# Patient Record
Sex: Male | Born: 1980 | Race: White | Hispanic: No | Marital: Single | Smoking: Current some day smoker
Health system: Southern US, Community
[De-identification: ages and names within clinical notes are randomized; demographics above are authoritative.]

## PROBLEM LIST (undated history)

## (undated) DIAGNOSIS — G5600 Carpal tunnel syndrome, unspecified upper limb: Secondary | ICD-10-CM

## (undated) DIAGNOSIS — Z720 Tobacco use: Secondary | ICD-10-CM

## (undated) DIAGNOSIS — B1920 Unspecified viral hepatitis C without hepatic coma: Secondary | ICD-10-CM

## (undated) DIAGNOSIS — I21A1 Myocardial infarction type 2: Secondary | ICD-10-CM

## (undated) DIAGNOSIS — R7989 Other specified abnormal findings of blood chemistry: Secondary | ICD-10-CM

## (undated) DIAGNOSIS — K4090 Unilateral inguinal hernia, without obstruction or gangrene, not specified as recurrent: Secondary | ICD-10-CM

## (undated) DIAGNOSIS — F1920 Other psychoactive substance dependence, uncomplicated: Secondary | ICD-10-CM

## (undated) DIAGNOSIS — T50901A Poisoning by unspecified drugs, medicaments and biological substances, accidental (unintentional), initial encounter: Secondary | ICD-10-CM

## (undated) DIAGNOSIS — J9601 Acute respiratory failure with hypoxia: Secondary | ICD-10-CM

## (undated) DIAGNOSIS — F410 Panic disorder [episodic paroxysmal anxiety] without agoraphobia: Secondary | ICD-10-CM

## (undated) DIAGNOSIS — F419 Anxiety disorder, unspecified: Secondary | ICD-10-CM

## (undated) DIAGNOSIS — A630 Anogenital (venereal) warts: Secondary | ICD-10-CM

## (undated) HISTORY — DX: Acute respiratory failure with hypoxia: J96.01

## (undated) HISTORY — DX: Other psychoactive substance dependence, uncomplicated: F19.20

## (undated) HISTORY — DX: Panic disorder (episodic paroxysmal anxiety): F41.0

## (undated) HISTORY — DX: Anogenital (venereal) warts: A63.0

## (undated) HISTORY — DX: Other specified abnormal findings of blood chemistry: R79.89

## (undated) HISTORY — DX: Anxiety disorder, unspecified: F41.9

## (undated) HISTORY — PX: MANDIBLE FRACTURE SURGERY: SHX706

## (undated) HISTORY — DX: Carpal tunnel syndrome, unspecified upper limb: G56.00

## (undated) HISTORY — DX: Unspecified viral hepatitis C without hepatic coma: B19.20

## (undated) HISTORY — DX: Tobacco use: Z72.0

## (undated) HISTORY — DX: Myocardial infarction type 2: I21.A1

## (undated) HISTORY — DX: Unilateral inguinal hernia, without obstruction or gangrene, not specified as recurrent: K40.90

---
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
## 2001-02-18 ENCOUNTER — Encounter: Admission: RE | Admit: 2001-02-18 | Discharge: 2001-02-18 | Payer: Self-pay | Admitting: Sports Medicine

## 2001-05-08 ENCOUNTER — Encounter: Admission: RE | Admit: 2001-05-08 | Discharge: 2001-05-08 | Payer: Self-pay | Admitting: Family Medicine

## 2001-05-08 ENCOUNTER — Encounter: Payer: Self-pay | Admitting: Family Medicine

## 2001-05-20 ENCOUNTER — Encounter: Admission: RE | Admit: 2001-05-20 | Discharge: 2001-05-20 | Payer: Self-pay | Admitting: Family Medicine

## 2004-05-07 ENCOUNTER — Emergency Department (HOSPITAL_COMMUNITY): Admission: EM | Admit: 2004-05-07 | Discharge: 2004-05-07 | Payer: Self-pay | Admitting: Family Medicine

## 2005-05-11 ENCOUNTER — Ambulatory Visit: Payer: Self-pay | Admitting: Family Medicine

## 2005-08-29 ENCOUNTER — Ambulatory Visit: Payer: Self-pay | Admitting: Sports Medicine

## 2005-09-18 ENCOUNTER — Ambulatory Visit: Payer: Self-pay | Admitting: Family Medicine

## 2005-10-06 ENCOUNTER — Ambulatory Visit: Payer: Self-pay | Admitting: Family Medicine

## 2005-10-19 ENCOUNTER — Ambulatory Visit: Payer: Self-pay | Admitting: Family Medicine

## 2005-11-08 ENCOUNTER — Ambulatory Visit: Payer: Self-pay | Admitting: Sports Medicine

## 2005-11-17 ENCOUNTER — Ambulatory Visit: Payer: Self-pay | Admitting: Family Medicine

## 2006-01-05 ENCOUNTER — Ambulatory Visit: Payer: Self-pay | Admitting: Sports Medicine

## 2006-01-10 ENCOUNTER — Ambulatory Visit: Payer: Self-pay | Admitting: Family Medicine

## 2006-02-27 ENCOUNTER — Ambulatory Visit: Payer: Self-pay | Admitting: Sports Medicine

## 2006-04-24 ENCOUNTER — Ambulatory Visit: Payer: Self-pay | Admitting: Sports Medicine

## 2006-06-27 ENCOUNTER — Ambulatory Visit: Payer: Self-pay | Admitting: Family Medicine

## 2006-10-11 ENCOUNTER — Ambulatory Visit: Payer: Self-pay | Admitting: Sports Medicine

## 2006-10-16 ENCOUNTER — Ambulatory Visit: Payer: Self-pay | Admitting: Family Medicine

## 2007-01-16 ENCOUNTER — Ambulatory Visit: Payer: Self-pay | Admitting: Family Medicine

## 2007-02-07 DIAGNOSIS — F411 Generalized anxiety disorder: Secondary | ICD-10-CM | POA: Insufficient documentation

## 2007-02-07 DIAGNOSIS — F410 Panic disorder [episodic paroxysmal anxiety] without agoraphobia: Secondary | ICD-10-CM | POA: Insufficient documentation

## 2007-03-28 ENCOUNTER — Encounter: Payer: Self-pay | Admitting: *Deleted

## 2007-07-17 ENCOUNTER — Encounter: Payer: Self-pay | Admitting: *Deleted

## 2007-07-24 ENCOUNTER — Encounter: Payer: Self-pay | Admitting: *Deleted

## 2007-07-25 ENCOUNTER — Ambulatory Visit: Payer: Self-pay | Admitting: Family Medicine

## 2007-08-29 ENCOUNTER — Ambulatory Visit: Payer: Self-pay | Admitting: Family Medicine

## 2007-09-03 ENCOUNTER — Telehealth: Payer: Self-pay | Admitting: Family Medicine

## 2007-11-04 ENCOUNTER — Encounter: Payer: Self-pay | Admitting: *Deleted

## 2007-12-10 ENCOUNTER — Telehealth: Payer: Self-pay | Admitting: *Deleted

## 2007-12-27 ENCOUNTER — Ambulatory Visit: Payer: Self-pay | Admitting: Family Medicine

## 2007-12-27 DIAGNOSIS — A630 Anogenital (venereal) warts: Secondary | ICD-10-CM

## 2008-01-27 ENCOUNTER — Telehealth: Payer: Self-pay | Admitting: Family Medicine

## 2008-01-30 ENCOUNTER — Encounter: Payer: Self-pay | Admitting: Family Medicine

## 2008-01-30 ENCOUNTER — Encounter (INDEPENDENT_AMBULATORY_CARE_PROVIDER_SITE_OTHER): Payer: Self-pay | Admitting: *Deleted

## 2008-02-03 ENCOUNTER — Telehealth: Payer: Self-pay | Admitting: *Deleted

## 2008-02-04 ENCOUNTER — Encounter: Payer: Self-pay | Admitting: *Deleted

## 2008-03-12 ENCOUNTER — Ambulatory Visit (HOSPITAL_COMMUNITY): Admission: RE | Admit: 2008-03-12 | Discharge: 2008-03-12 | Payer: Self-pay | Admitting: Family Medicine

## 2008-03-12 ENCOUNTER — Ambulatory Visit: Payer: Self-pay | Admitting: Family Medicine

## 2008-03-12 DIAGNOSIS — F17200 Nicotine dependence, unspecified, uncomplicated: Secondary | ICD-10-CM | POA: Insufficient documentation

## 2008-03-19 ENCOUNTER — Encounter: Payer: Self-pay | Admitting: Family Medicine

## 2008-07-13 ENCOUNTER — Telehealth: Payer: Self-pay | Admitting: Family Medicine

## 2008-07-16 ENCOUNTER — Telehealth: Payer: Self-pay | Admitting: Family Medicine

## 2008-09-06 ENCOUNTER — Encounter: Payer: Self-pay | Admitting: Emergency Medicine

## 2008-09-06 ENCOUNTER — Ambulatory Visit (HOSPITAL_COMMUNITY): Admission: EM | Admit: 2008-09-06 | Discharge: 2008-09-06 | Payer: Self-pay | Admitting: Emergency Medicine

## 2008-09-22 ENCOUNTER — Ambulatory Visit (HOSPITAL_COMMUNITY): Admission: RE | Admit: 2008-09-22 | Discharge: 2008-09-22 | Payer: Self-pay | Admitting: Otolaryngology

## 2008-10-01 ENCOUNTER — Ambulatory Visit (HOSPITAL_BASED_OUTPATIENT_CLINIC_OR_DEPARTMENT_OTHER): Admission: RE | Admit: 2008-10-01 | Discharge: 2008-10-01 | Payer: Self-pay | Admitting: Otolaryngology

## 2008-10-28 ENCOUNTER — Telehealth: Payer: Self-pay | Admitting: Family Medicine

## 2008-11-10 ENCOUNTER — Ambulatory Visit: Payer: Self-pay | Admitting: Family Medicine

## 2009-08-18 ENCOUNTER — Ambulatory Visit: Payer: Self-pay | Admitting: Family Medicine

## 2009-08-23 ENCOUNTER — Encounter: Payer: Self-pay | Admitting: Family Medicine

## 2010-01-03 ENCOUNTER — Telehealth (INDEPENDENT_AMBULATORY_CARE_PROVIDER_SITE_OTHER): Payer: Self-pay | Admitting: *Deleted

## 2010-01-13 ENCOUNTER — Telehealth: Payer: Self-pay | Admitting: Family Medicine

## 2010-01-13 ENCOUNTER — Telehealth (INDEPENDENT_AMBULATORY_CARE_PROVIDER_SITE_OTHER): Payer: Self-pay | Admitting: *Deleted

## 2010-02-10 ENCOUNTER — Encounter: Payer: Self-pay | Admitting: *Deleted

## 2010-03-03 ENCOUNTER — Ambulatory Visit: Payer: Self-pay | Admitting: Family Medicine

## 2010-06-02 ENCOUNTER — Encounter: Payer: Self-pay | Admitting: Family Medicine

## 2010-06-28 ENCOUNTER — Emergency Department (HOSPITAL_COMMUNITY): Admission: EM | Admit: 2010-06-28 | Discharge: 2010-06-29 | Payer: Self-pay | Admitting: Emergency Medicine

## 2011-01-10 NOTE — Miscellaneous (Signed)
Summary: patient summary  Clinical Lists Changes  Nice, but slightly challenging pt.  Main problem is anxiety.  has been on klonopin for years.  functions fairly well on klonopin.  has controlled substances contract.  I have asked him to come in every 4 months for his anxiety.  care also complicated by no insurance.  has had issues trying to get debra hill.    Problems: Removed problem of TINEA VERSICOLOR (ICD-111.0) Removed problem of BRADYCARDIA (ICD-427.89) Removed problem of FOLLICULITIS (ICD-704.8) Removed problem of DERMATITIS, CONTACT, NOS (ICD-692.9) Removed problem of Symptom of  RECTAL BLEEDING, HX OF (ICD-V12.79) Removed problem of Symptom of  SOB (ICD-786.05) Removed problem of DEPRESSIVE DISORDER, NOS (ICD-311) Assessed ANXIETY as comment only -  His updated medication list for this problem includes:    Klonopin 0.5 Mg Tabs (Clonazepam) .Marland Kitchen... Take 2 tablets po bid  Assessed PANIC ATTACKS as comment only -  His updated medication list for this problem includes:    Klonopin 0.5 Mg Tabs (Clonazepam) .Marland Kitchen... Take 2 tablets po bid  Observations: Added new observation of SOCIAL HX: works Furniture conservator/restorer.  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  single.  smoker.  former drug user, but not current.  no insurance.  lots of financial and social stressors right now. (06/02/2010 13:27) Added new observation of FAMILY HX: mom - asthma, bronchitis, cholesterol,  father-diabetes, hypertension, hyperlipidemia  FGF- deceased unknown FGM dies of lung CA at 33,  MGF and MGM well, Younger sister well. (06/02/2010 13:27) Added new observation of PSH REVIEWED: reviewed - no changes required (06/02/2010 13:27) Added new observation of PAST MED HX: Folliculitis, striae panic attacks/ anxiety rectal bleeding  BRBRP (06/02/2010 13:27)      Impression & Recommendations:  Problem # 1:  ANXIETY (ICD-300.00) Assessment Comment Only functions fairly well on klonopin.  I still think it would be a good idea for  him to go to psychiatry or mood disorders clinic for more extensive evaluation of his mental health issues.  however, this has been challenging because he has no insurance.   His updated medication list for this problem includes:    Klonopin 0.5 Mg Tabs (Clonazepam) .Marland Kitchen... Take 2 tablets po bid  Problem # 2:  PANIC ATTACKS (ICD-300.01) Assessment: Comment Only see above discussion His updated medication list for this problem includes:    Klonopin 0.5 Mg Tabs (Clonazepam) .Marland Kitchen... Take 2 tablets po bid  Complete Medication List: 1)  Prilosec 20 Mg Cpdr (Omeprazole) .... Take 1 capsule by mouth once a day 2)  Klonopin 0.5 Mg Tabs (Clonazepam) .... Take 2 tablets po bid   Past History:  Past Medical History: Folliculitis, striae panic attacks/ anxiety rectal bleeding  BRBRP  Past Surgical History: Reviewed history from 07/25/2007 and no changes required. none   Family History: mom - asthma, bronchitis, cholesterol,  father-diabetes, hypertension, hyperlipidemia  FGF- deceased unknown FGM dies of lung CA at 67,  MGF and MGM well, Younger sister well.  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  single.  smoker.  former drug user, but not current.  no insurance.  lots of financial and social stressors right now.

## 2011-01-10 NOTE — Assessment & Plan Note (Signed)
Summary: f/up,tcb   Vital Signs:  Patient profile:   30 year old male Weight:      221.8 pounds Temp:     98.3 degrees F oral Pulse rate:   69 / minute Pulse rhythm:   regular BP sitting:   120 / 68  (left arm) Cuff size:   large  Vitals Entered By: Loralee Pacas CMA  (March 03, 2010 3:41 PM) CC: anxiety, smoking Comments pt needs a refill on klonipin and would like to talk about an rx to help him stop smoking(chantix)   Primary Care Provider:  Asher Muir MD  CC:  anxiety and smoking.  History of Present Illness: 1.  anxiety/depression--thinks he may be more depressed.  lots of stress, esp financial and at work.  losing train of thought at times.  work has not been going well.  smoking more.  sleep is not good.  +irritability and guilt.  has gained some weight.  no hopelessness or anhedonia.  no suicidal thoughts.  no symptoms of mania.  on scheduled klonopin, which had been working well for him until lately.    2.  tobacco--would like to quit smoking.  interested in chantix because one of his friends quit using this.  smoking more b/c of stress.    [phone]  Current Medications (verified): 1)  Prilosec 20 Mg Cpdr (Omeprazole) .... Take 1 Capsule By Mouth Once A Day 2)  Klonopin 0.5 Mg  Tabs (Clonazepam) .... Take 2 Tablets Po Bid  Family History: Dad 13- DM, mom 56- asthma, bronchitis, cholesterol, FGF- deceased unknown, FGM dies of lung CA at 3, MGF and MGM well, Younger sister well.  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  single, but has a girlfriend of many years.  smoker.  former drug user, but not current.  no insurance.  lots of financial and social stressors right now.  Review of Systems General:  Denies loss of appetite and weight loss. Psych:  Complains of anxiety, depression, and irritability; denies alternate hallucination ( auditory/visual), easily tearful, sense of great danger, suicidal thoughts/plans, and thoughts of  violence.  Physical Exam  General:  Well-developed,well-nourished,in no acute distress; alert,appropriate and cooperative throughout examination Psych:  Oriented X3 and memory intact for recent and remote.  slightly anxiousnormally interactive and good eye contact.   Additional Exam:  vital signs reviewed    Impression & Recommendations:  Problem # 1:  ANXIETY (ICD-300.00) Assessment Deteriorated  I think this is largely stress-related.  I do not get a clear picture of depression.  also did not get a hx of manic symptoms from him.  Gave him GAD-7, PHQ-9, and mood disorder questionnaire.  he did not have time to fill them out today.  but he is to bring them back to the clinic for me to review; then I will give him a call.  in the meantime, do not want to make any changes.    His updated medication list for this problem includes:    Klonopin 0.5 Mg Tabs (Clonazepam) .Marland Kitchen... Take 2 tablets po bid  Orders: [facility]- Est Level  3 (29528)  Problem # 2:  TOBACCO ABUSE (ICD-305.1) Assessment: Deteriorated  gave smoking cessation handout and 1800-QUITNOW info  Orders: [facility]- Est Level  3 (41324)  Complete Medication List: 1)  Prilosec 20 Mg Cpdr (Omeprazole) .... Take 1 capsule by mouth once a day 2)  Klonopin 0.5 Mg Tabs (Clonazepam) .... Take 2 tablets po bid  Patient Instructions: 1)  It was nice to see you today. 2)  Fill out the questionnaires I gave you and bring them or mail them back when you have a chance. 3)  I think counseling would be helpful for you.  If you are interested, try calling [facility] at [phone].  They are located at [address] in [place].  4)  Please schedule a follow-up appointment in 4 months .  Prescriptions: KLONOPIN 0.5 MG  TABS (CLONAZEPAM) Take 2 tablets PO BID  #120 x 4   Entered and Authorized by:   Asher Muir MD   Signed by:   Asher Muir MD on 03/03/2010   Method used:   Print then Give to Patient   RxID:    [id number]

## 2011-01-10 NOTE — Miscellaneous (Signed)
Summary: no show  Clinical Lists Changes  no show.Loralee Pacas CMA  February 10, 2010 10:03 AM

## 2011-01-10 NOTE — Progress Notes (Signed)
Summary: refill  Phone Note Refill Request Call back at [phone] Message from:  Patient  Refills Requested: Medication #1:  KLONOPIN 0.5 MG  TABS Take 2 tablets PO BID leaving town tomorrow for a week - will be out [employer]  Initial call taken by: De Nurse,  January 13, 2010 9:53 AM  Follow-up for Phone Call        to pcp Follow-up by: Golden Circle RN,  January 13, 2010 9:57 AM  Additional Follow-up for Phone Call Additional follow up Details #1::        pt is leaving at 4:00 tomorrow and is concerned that he will not have it before he leaves. Additional Follow-up by: De Nurse,  January 13, 2010 11:58 AM    Additional Follow-up for Phone Call Additional follow up Details #2::    to pcp Follow-up by: Golden Circle RN,  January 13, 2010 12:08 PM  Additional Follow-up for Phone Call Additional follow up Details #3:: Details for Additional Follow-up Action Taken: REfilled one month supply.  He is due for a follow up visit.  Would you ask him to schedule.  Our agreement is that he will come in for OV every 4 months.  Thanks.  Faxed to [facility] Additional Follow-up by: Asher Muir MD,  January 13, 2010 1:37 PM  Prescriptions: Scarlette Calico 0.5 MG  TABS (CLONAZEPAM) Take 2 tablets PO BID  #120 x 0   Entered and Authorized by:   Asher Muir MD   Signed by:   Asher Muir MD on 01/13/2010   Method used:   Printed then faxed to ...       [facility]  [phone]* (retail)       [address] [place], [place]  [address]       Ph: [phone] or [phone]       Fax: [phone]   RxID:   [id number]  appt scheduled for 2/16.Golden Circle RN  January 13, 2010 2:22 PM

## 2011-01-10 NOTE — Progress Notes (Signed)
Summary: Rx Prob  Phone Note Call from Patient   Caller: Patient Summary of Call: Pharmacy says they do not have rx that Dr. Lafonda Mosses sent in today. Initial call taken by: Clydell Hakim,  January 13, 2010 3:54 PM  Follow-up for Phone Call        rx for Klonopin called in to pharmacy since they did not receive today. patient notified. Follow-up by: Theresia Lo RN,  January 13, 2010 4:58 PM

## 2011-01-10 NOTE — Progress Notes (Signed)
Summary: Blood Type Ques  Phone Note Call from Patient Call back at [phone]   Caller: Patient Summary of Call: Pt checking to see if we have his blood type is on file with [place] and if so what it might be? Initial call taken by: Clydell Hakim,  January 03, 2010 4:32 PM  Follow-up for Phone Call        advised patient that we do not  have a record of his blood type. he  will call hospital medical records to see if they can help him. he was born at [facility]. Follow-up by: Theresia Lo RN,  January 03, 2010 5:26 PM

## 2011-01-20 ENCOUNTER — Encounter: Payer: Self-pay | Admitting: *Deleted
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
## 2011-09-11 LAB — POCT I-STAT 4, (NA,K, GLUC, HGB,HCT)
Glucose, Bld: 103 — ABNORMAL HIGH
HCT: 43
Hemoglobin: 14.6
Potassium: 3.8
Sodium: 144

## 2016-03-27 ENCOUNTER — Ambulatory Visit (INDEPENDENT_AMBULATORY_CARE_PROVIDER_SITE_OTHER): Payer: Self-pay | Admitting: Physician Assistant

## 2016-03-27 VITALS — BP 132/80 | HR 79 | Temp 98.0°F | Resp 17 | Ht 74.0 in | Wt 215.0 lb

## 2016-03-27 DIAGNOSIS — I499 Cardiac arrhythmia, unspecified: Secondary | ICD-10-CM | POA: Insufficient documentation

## 2016-03-27 DIAGNOSIS — F1911 Other psychoactive substance abuse, in remission: Secondary | ICD-10-CM | POA: Insufficient documentation

## 2016-03-27 DIAGNOSIS — K4090 Unilateral inguinal hernia, without obstruction or gangrene, not specified as recurrent: Secondary | ICD-10-CM

## 2016-03-27 DIAGNOSIS — G5603 Carpal tunnel syndrome, bilateral upper limbs: Secondary | ICD-10-CM

## 2016-03-27 DIAGNOSIS — B360 Pityriasis versicolor: Secondary | ICD-10-CM

## 2016-03-27 DIAGNOSIS — Z87898 Personal history of other specified conditions: Secondary | ICD-10-CM

## 2016-03-27 MED ORDER — FLUCONAZOLE 150 MG PO TABS: ORAL_TABLET | ORAL | Status: DC

## 2016-03-27 NOTE — Progress Notes (Signed)
Subjective:    Patient ID: Terrence Henry, male    DOB: 12-26-1980, 35 y.o.   MRN: [id number] Chief Complaint  Patient presents with  . Abdominal Pain    lower left side, concern about hernia x 5 months ago , notice raised area on lower left side   . Palpitations  . Rash  . Numbness    Both hands and fingers    HPI Terrence Henry is a 35 yo Caucasian male with no pmh here today for groin pain with concern for a hernia, palpitations, bilateral hand numbness, and rash.  Patient states that he tore his oblique muscles in high school during cross country running and was told that he needed to have surgery. Patient never followed up for surgery. Approx 5 months ago while he was fixing his car he felt a pop in grown. Since then he has noticed increased bulging and pain in his left groin. He states that the bulging is worse during bowel movements and sex. He has also developed nausea and increased pain in the last week. He has intermittent swelling in his scrotum during strenuous activity. He states he is able to reduce the hernia. He denies any fever, chills, vomiting, constipation, diarrhea, abd pain, or erythema around the hernia site.   Of note patient is uninsured and is self pay. His mom spoke with a surgeon who does payment plans and told him that he needed to be seen at his PCP and then referred. Patient is not sure of the surgeons name. He will talk with his mom this evening and let us know the name of the surgeon so that we can make the referral.  Patient also states that he has a history of an irregular heart rate and murmur that has been mentioned several times in the past by different providers. Patient has not had insurance and has never followed up. He states he feels palpitations occasionally, but denies cp or sob.   Of note patient is a polysubstance drug abuser that quite 5-6 months ago. Approx 10 years ago he was using ectasy and cocaine. He then transitioned to smoking  methamphetamine until 5-6 months ago. He is concerned that his drug use has has caused his heart arrhythmia.   Patient also complains of numbness and tingling in his upper extremities that is worse at night and during his daily activities such as painting,fishing, and DJing. He states that this has been ongoing for approx 1 year. His mother has a history of carpal tunnel. Patient has not tried anything for the pain.   He also complains of a rash on his upper arms and back. He has been treated for tinea versicolor in the past. He denies any pruritis with the rash.      Review of Systems  Constitutional: Negative for fever, chills, diaphoresis and fatigue.  HENT: Negative.   Eyes: Negative.   Respiratory: Negative for cough, chest tightness, shortness of breath and wheezing.   Cardiovascular: Negative for chest pain.  Gastrointestinal: Positive for nausea. Negative for vomiting, abdominal pain, diarrhea, constipation, blood in stool and abdominal distention.  Genitourinary: Positive for scrotal swelling (Intermittant and bulging mass in left groin). Negative for dysuria, urgency, frequency, hematuria, flank pain and testicular pain.  Skin: Positive for rash.  Neurological: Positive for numbness. Negative for dizziness, syncope, weakness, light-headedness and headaches.       Objective:   Physical Exam  Constitutional: He is oriented to person, place, and time. He appears well-developed  and well-nourished. No distress.  Eyes: Pupils are equal, round, and reactive to light.  Neck: Normal range of motion. Neck supple.  Cardiovascular: Normal rate and intact distal pulses.  An irregular rhythm present.  Regular rate and irregular rhythm with an ectopic beat every 4-6 seconds  Pulmonary/Chest: Effort normal and breath sounds normal.  Abdominal: Soft. Bowel sounds are normal. A hernia is present. Hernia confirmed positive in the left inguinal area.  Genitourinary:    Left testis shows no  swelling and no tenderness.  Musculoskeletal:  Positive phalen and tinel test upper extremities bilterally  Neurological: He is alert and oriented to person, place, and time.  Skin: Skin is warm and dry.  Hypopigmented macular papular rash over upper arms and back that is coalescing   Psychiatric: He has a normal mood and affect. His behavior is normal. Judgment and thought content normal.          Assessment & Plan:  1. Unilateral inguinal hernia without obstruction or gangrene, recurrence not specified Able to be reduced with no signs of strangulation or infection. Will refer patient to general surgery when he is able to provide the name of group.  2. Irregular cardiac rhythm Patient is not having any cp or sob with palpitations. Patient does not want an EKG or echo due to cost. This is an ongoing issue. Will eventually need a cardiac workup. Not acute at this time.  3. Tinea versicolor - fluconazole (DIFLUCAN) 150 MG tablet; Take 2 tablets today. Repeat in 1 week.  Dispense: 4 tablet; Refill: 0  4. Bilateral carpal tunnel syndrome Patient refused wrist splints due to cost. Will treat symptomatically.   Azucena Kuba PA-S 03/27/2016

## 2016-03-27 NOTE — Patient Instructions (Addendum)
Please call or email [place] with the surgeon you would like for [place] to refer you to.  Please contact the [facility] Financial Aid Office [phone]. You may qualify for reduced cost at [facility] facilities.    Carpal Tunnel Syndrome Carpal tunnel syndrome is a condition that causes pain in your hand and arm. The carpal tunnel is a narrow area located on the palm side of your wrist. Repeated wrist motion or certain diseases may cause swelling within the tunnel. This swelling pinches the main nerve in the wrist (median nerve). CAUSES  This condition may be caused by:   Repeated wrist motions.  Wrist injuries.  Arthritis.  A cyst or tumor in the carpal tunnel.  Fluid buildup during pregnancy. Sometimes the cause of this condition is not known.  RISK FACTORS This condition is more likely to develop in:   People who have jobs that cause them to repeatedly move their wrists in the same motion, such as butchers and cashiers.  Women.  People with certain conditions, such as:  Diabetes.  Obesity.  An underactive thyroid (hypothyroidism).  Kidney failure. SYMPTOMS  Symptoms of this condition include:   A tingling feeling in your fingers, especially in your thumb, index, and middle fingers.  Tingling or numbness in your hand.  An aching feeling in your entire arm, especially when your wrist and elbow are bent for long periods of time.  Wrist pain that goes up your arm to your shoulder.  Pain that goes down into your palm or fingers.  A weak feeling in your hands. You may have trouble grabbing and holding items. Your symptoms may feel worse during the night.  DIAGNOSIS  This condition is diagnosed with a medical history and physical exam. You may also have tests, including:   An electromyogram (EMG). This test measures electrical signals sent by your nerves into the muscles.  X-rays. TREATMENT  Treatment for this condition includes:  Lifestyle changes. It is  important to stop doing or modify the activity that caused your condition.  Physical or occupational therapy.  Medicines for pain and inflammation. This may include medicine that is injected into your wrist.  A wrist splint.  Surgery. HOME CARE INSTRUCTIONS  If You Have a Splint:  Wear it as told by your health care provider. Remove it only as told by your health care provider.  Loosen the splint if your fingers become numb and tingle, or if they turn cold and blue.  Keep the splint clean and dry. General Instructions  Take over-the-counter and prescription medicines only as told by your health care provider.  Rest your wrist from any activity that may be causing your pain. If your condition is work related, talk to your employer about changes that can be made, such as getting a wrist pad to use while typing.  If directed, apply ice to the painful area:  Put ice in a plastic bag.  Place a towel between your skin and the bag.  Leave the ice on for 20 minutes, 2-3 times per day.  Keep all follow-up visits as told by your health care provider. This is important.  Do any exercises as told by your health care provider, physical therapist, or occupational therapist. SEEK MEDICAL CARE IF:   You have new symptoms.  Your pain is not controlled with medicines.  Your symptoms get worse.   This information is not intended to replace advice given to you by your health care provider. Make sure you discuss  any questions you have with your health care provider.   Document Released: 11/24/2000 Document Revised: 08/18/2015 Document Reviewed: 04/14/2015 Elsevier Interactive Patient Education 2016 [employer]   IF you received an x-ray today, you will receive an invoice from [facility] Please contact [facility] at [phone] with questions or concerns regarding your invoice.   IF you received labwork today, you will receive an invoice from [facility]. Please contact [facility] at [phone] with questions or concerns regarding your invoice.   Our billing staff will not be able to assist you with questions regarding bills from these companies.  You will be contacted with the lab results as soon as they are available. The fastest way to get your results is to activate your My Chart account. Instructions are located on the last page of this paperwork. If you have not heard from us regarding the results in 2 weeks, please contact this office.

## 2016-03-29 NOTE — Progress Notes (Signed)
Subjective:   Patient ID: Terrence Henry, male     DOB: 12/31/1980, 35 y.o.    MRN: [id number]  PCP: No primary care provider on file.  Chief Complaint  Patient presents with  . Abdominal Pain    lower left side, concern about hernia x 5 months ago , notice raised area on lower left side   . Palpitations  . Rash  . Numbness    Both hands and fingers    HPI  Presents for evaluation of groin pain with concern for a hernia, palpitations, bilateral hand numbness, and rash.   Patient states that he tore his oblique muscles in high school during cross country running and was told that he needed to have surgery. Patient never followed up for surgery. Approx 5 months ago while he was fixing his car he felt a pop in LEFT groin. Since then he has noticed increased bulging and pain in his left groin. He states that the bulging is worse during bowel movements and sex. He has also developed nausea and increased pain in the last week. He has intermittent swelling in his scrotum during strenuous activity. He states he is able to reduce the hernia. He denies any fever, chills, vomiting, constipation, diarrhea, abd pain, or erythema around the hernia site.   Of note patient is uninsured and is self pay. His mom spoke with a surgeon who does payment plans and told him that he needed to be seen at his PCP and then referred.   Patient also states that he has a history of an irregular heart rate and murmur that has been mentioned several times in the past by different providers. Patient has not had insurance and has never followed up. He states he feels palpitations occasionally, but denies cp or sob.   Of note patient is a former polysubstance abuser, reportedly quit 5-6 months ago. Approx 10 years ago he was using ectasy and cocaine. He then transitioned to smoking methamphetamine until 5-6 months ago. He is concerned that his drug use has has caused his heart arrhythmia.   Patient also complains of  numbness and tingling in his upper extremities that is worse at night and during his daily activities such as painting, fishing, and DJing. He states that this has been ongoing for approx 1 year. His mother has a history of carpal tunnel. Patient has not tried anything for the pain.   He also complains of a rash on his upper arms and back. He has been treated for tinea versicolor in the past. He denies any pruritis with the rash.     Prior to Admission medications   Medication Sig Start Date End Date Taking? Authorizing Provider  NONE     Porfirio Oarhelle Oswin Griffith, PA-C     No Known Allergies   Patient Active Problem List   Diagnosis Date Noted  . History of substance abuse 03/27/2016  . Irregular cardiac rhythm 03/27/2016  . TOBACCO ABUSE 03/12/2008  . VENEREAL WART 12/27/2007  . ANXIETY 02/07/2007  . PANIC ATTACKS 02/07/2007     Family History  Problem Relation Age of Onset  . Hypertension Mother   . Hyperlipidemia Mother   . Diabetes Father      Social History   Social History  . Marital Status: Single    Spouse Name: N/A  . Number of Children: N/A  . Years of Education: N/A   Occupational History  . Not on file.   Social History Main Topics  .  Smoking status: Current Some Day Smoker -- 0.50 packs/day for 15 years  . Smokeless tobacco: Never Used  . Alcohol Use: 0.0 oz/week    0 Standard drinks or equivalent per week  . Drug Use: Yes    Special: Methamphetamines, MDMA (Ecstacy), Cocaine  . Sexual Activity: Yes    Birth Control/ Protection: Condom   Other Topics Concern  . Not on file   Social History Narrative  . No narrative on file        Review of Systems Review of Systems  Constitutional: Negative for fever, chills, diaphoresis and fatigue.  HENT: Negative.  Eyes: Negative.  Respiratory: Negative for cough, chest tightness, shortness of breath and wheezing.  Cardiovascular: Negative for chest pain.  Gastrointestinal: Positive for nausea. Negative  for vomiting, abdominal pain, diarrhea, constipation, blood in stool and abdominal distention.  Genitourinary: Positive for scrotal swelling (Intermittant and bulging mass in left groin). Negative for dysuria, urgency, frequency, hematuria, flank pain and testicular pain.  Skin: Positive for rash.  Neurological: Positive for numbness. Negative for dizziness, syncope, weakness, light-headedness and headaches.        Objective:  Physical Exam  Constitutional: He is oriented to person, place, and time. He appears well-developed and well-nourished. He is active and cooperative. No distress.  BP 132/80 mmHg  Pulse 79  Temp(Src) 98 F (36.7 C) (Oral)  Resp 17  Ht  (1.88 m)  Wt 215 lb (97.523 kg)  BMI 27.59 kg/m2  SpO2 98%  HENT:  Head: Normocephalic and atraumatic.  Right Ear: Hearing normal.  Left Ear: Hearing normal.  Eyes: Conjunctivae are normal. No scleral icterus.  Neck: Normal range of motion. Neck supple. No thyromegaly present.  Cardiovascular: Normal rate, regular rhythm and normal heart sounds.   Extrasystoles (Q4-6 beats) are present.  No murmur heard. Pulses:      Radial pulses are 2+ on the right side, and 2+ on the left side.  Pulmonary/Chest: Effort normal and breath sounds normal.  Genitourinary:     Lymphadenopathy:       Head (right side): No tonsillar, no preauricular, no posterior auricular and no occipital adenopathy present.       Head (left side): No tonsillar, no preauricular, no posterior auricular and no occipital adenopathy present.    He has no cervical adenopathy.       Right: No supraclavicular adenopathy present.       Left: No supraclavicular adenopathy present.  Neurological: He is alert and oriented to person, place, and time. He has normal strength. No sensory deficit.  Phalen's reproduces symptoms in both hands. Tinel's reproduces symptoms on the LEFT.  Skin: Skin is warm, dry and intact. Rash noted. Rash is macular (upper trunk,  consistent with tiea versicolor). No cyanosis or erythema. Nails show no clubbing.  Psychiatric: His speech is normal and behavior is normal. Judgment and thought content normal. His mood appears anxious. His affect is not angry, not blunt, not labile and not inappropriate. Cognition and memory are normal. He does not exhibit a depressed mood.             Assessment & Plan:  1. Unilateral inguinal hernia without obstruction or gangrene, recurrence not specified He reports that his mother located a surgeon willing to make a payment arrangement, but he doesn't know the name or the practice. He will speak with his mother and contact me so I can refer him.  2. Irregular cardiac rhythm Recommend an EKG and likely cardiology evaluation. He  is uninsured and desires to delay this. Given that he is generally asymptomatic, I think that is reasonable, though he may be required to proceed if he is to have surgery to repair the hernia.  3. Tinea versicolor Anticipatory guidance provided. - fluconazole (DIFLUCAN) 150 MG tablet; Take 2 tablets today. Repeat in 1 week.  Dispense: 4 tablet; Refill: 0  4. Bilateral carpal tunnel syndrome He elects against wrist splints today. Anticipatory guidance provided.   Fernande Bras, PA-C Physician Assistant-Certified [facility]

## 2018-10-22 ENCOUNTER — Encounter (HOSPITAL_COMMUNITY): Payer: Self-pay | Admitting: Emergency Medicine

## 2018-10-22 ENCOUNTER — Emergency Department (HOSPITAL_COMMUNITY)
Admission: EM | Admit: 2018-10-22 | Discharge: 2018-10-22 | Disposition: A | Discharge status: Home / Self Care | Payer: Self-pay | Attending: Emergency Medicine | Admitting: Emergency Medicine

## 2018-10-22 ENCOUNTER — Emergency Department (HOSPITAL_COMMUNITY): Payer: Self-pay

## 2018-10-22 DIAGNOSIS — F17210 Nicotine dependence, cigarettes, uncomplicated: Secondary | ICD-10-CM | POA: Insufficient documentation

## 2018-10-22 DIAGNOSIS — Z79899 Other long term (current) drug therapy: Secondary | ICD-10-CM | POA: Insufficient documentation

## 2018-10-22 DIAGNOSIS — K4090 Unilateral inguinal hernia, without obstruction or gangrene, not specified as recurrent: Secondary | ICD-10-CM | POA: Insufficient documentation

## 2018-10-22 LAB — CBC WITH DIFFERENTIAL/PLATELET
Abs Immature Granulocytes: 0.04 10*3/uL (ref 0.00–0.07)
BASOS PCT: 1 %
Basophils Absolute: 0.1 10*3/uL (ref 0.0–0.1)
EOS ABS: 0.4 10*3/uL (ref 0.0–0.5)
EOS PCT: 5 %
HCT: 45.5 % (ref 39.0–52.0)
Hemoglobin: 14.8 g/dL (ref 13.0–17.0)
Immature Granulocytes: 1 %
Lymphocytes Relative: 42 %
Lymphs Abs: 3.1 10*3/uL (ref 0.7–4.0)
MCH: 29.8 pg (ref 26.0–34.0)
MCHC: 32.5 g/dL (ref 30.0–36.0)
MCV: 91.7 fL (ref 80.0–100.0)
MONO ABS: 0.6 10*3/uL (ref 0.1–1.0)
Monocytes Relative: 9 %
Neutro Abs: 3.1 10*3/uL (ref 1.7–7.7)
Neutrophils Relative %: 42 %
PLATELETS: 164 10*3/uL (ref 150–400)
RBC: 4.96 MIL/uL (ref 4.22–5.81)
RDW: 12.6 % (ref 11.5–15.5)
WBC: 7.2 10*3/uL (ref 4.0–10.5)
nRBC: 0 % (ref 0.0–0.2)

## 2018-10-22 LAB — COMPREHENSIVE METABOLIC PANEL
ALT: 61 U/L — ABNORMAL HIGH (ref 0–44)
ANION GAP: 10 (ref 5–15)
AST: 33 U/L (ref 15–41)
Albumin: 3.9 g/dL (ref 3.5–5.0)
Alkaline Phosphatase: 48 U/L (ref 38–126)
BUN: 11 mg/dL (ref 6–20)
CO2: 23 mmol/L (ref 22–32)
CREATININE: 0.86 mg/dL (ref 0.61–1.24)
Calcium: 9.5 mg/dL (ref 8.9–10.3)
Chloride: 105 mmol/L (ref 98–111)
Glucose, Bld: 125 mg/dL — ABNORMAL HIGH (ref 70–99)
Potassium: 3.7 mmol/L (ref 3.5–5.1)
SODIUM: 138 mmol/L (ref 135–145)
Total Bilirubin: 0.6 mg/dL (ref 0.3–1.2)
Total Protein: 6.5 g/dL (ref 6.5–8.1)

## 2018-10-22 LAB — URINALYSIS, ROUTINE W REFLEX MICROSCOPIC
BILIRUBIN URINE: NEGATIVE
Glucose, UA: NEGATIVE mg/dL
Hgb urine dipstick: NEGATIVE
KETONES UR: NEGATIVE mg/dL
LEUKOCYTES UA: NEGATIVE
NITRITE: NEGATIVE
PH: 5 (ref 5.0–8.0)
Protein, ur: NEGATIVE mg/dL
SPECIFIC GRAVITY, URINE: 1.018 (ref 1.005–1.030)

## 2018-10-22 MED ORDER — HYDROMORPHONE HCL 1 MG/ML IJ SOLN
1.0000 mg | Freq: Once | INTRAMUSCULAR | Status: AC
  Administered 2018-10-22: 1 mg via INTRAVENOUS
  Filled 2018-10-22: qty 1

## 2018-10-22 MED ORDER — ONDANSETRON HCL 4 MG/2ML IJ SOLN
4.0000 mg | Freq: Once | INTRAMUSCULAR | Status: AC
  Administered 2018-10-22: 4 mg via INTRAVENOUS
  Filled 2018-10-22: qty 2

## 2018-10-22 MED ORDER — SODIUM CHLORIDE 0.9 % IV BOLUS
1000.0000 mL | Freq: Once | INTRAVENOUS | Status: AC
  Administered 2018-10-22: 1000 mL via INTRAVENOUS

## 2018-10-22 MED ORDER — MORPHINE SULFATE (PF) 4 MG/ML IV SOLN
4.0000 mg | Freq: Once | INTRAVENOUS | Status: AC
  Administered 2018-10-22: 4 mg via INTRAVENOUS
  Filled 2018-10-22: qty 1

## 2018-10-22 MED ORDER — IOHEXOL 300 MG/ML  SOLN
100.0000 mL | Freq: Once | INTRAMUSCULAR | Status: AC | PRN
  Administered 2018-10-22: 100 mL via INTRAVENOUS

## 2018-10-22 NOTE — ED Triage Notes (Signed)
Pt has a hernia that he states has been there "for a while" but while push mowing grass this weekend hernia became larger and unable to push it back in and pain is worse. Pt denies any v/d but reports nausea with pain.

## 2018-10-22 NOTE — ED Notes (Signed)
ED Provider at bedside. 

## 2018-10-22 NOTE — ED Provider Notes (Signed)
[facility] EMERGENCY DEPARTMENT Provider Note   CSN: [id number] Arrival date & time: 10/22/18  1007     History   Chief Complaint Chief Complaint  Patient presents with  . Hernia    HPI Terrence Henry is a 37 y.o. male.  37 year old male presents with complaint of left inguinal hernia for several years.  Patient states that he noticed a few days ago while blowing leaves that he had pain in his left groin and felt like his hernia was smaller than usual with a tender knot.  Patient states that he was mowing the grass this past weekend and noticed that his hernia returned to its normal/larger size and was more painful than it has been.  Patient states he is able to reduce the hernia however he continues to have pain in the area.  He denies difficulty moving his bowels or difficulty voiding however states he does have pain with bowel movements.  He reports nausea without vomiting.  Denies any other complaints or concerns.     No past medical history on file.  Patient Active Problem List   Diagnosis Date Noted  . History of substance abuse (HCC) 03/27/2016  . Irregular cardiac rhythm 03/27/2016  . TOBACCO ABUSE 03/12/2008  . VENEREAL WART 12/27/2007  . ANXIETY 02/07/2007  . PANIC ATTACKS 02/07/2007    Past Surgical History:  Procedure Laterality Date  . MANDIBLE FRACTURE SURGERY     Altercation        Home Medications    Prior to Admission medications   Medication Sig Start Date End Date Taking? Authorizing Provider  diphenhydrAMINE (BENADRYL) 25 mg capsule Take 25 mg by mouth every 6 (six) hours as needed for allergies.   Yes [provider]  HYDROcodone-acetaminophen (NORCO) 10-325 MG tablet Take 1 tablet by mouth every 6 (six) hours as needed for moderate pain.   Yes [provider]  loratadine (CLARITIN) 10 MG tablet Take 10 mg by mouth daily as needed for allergies.   Yes [provider]  fluconazole (DIFLUCAN) 150 MG  tablet Take 2 tablets today. Repeat in 1 week. Patient not taking: Reported on 10/22/2018 03/27/16   Porfirio Oar, PA    Family History Family History  Problem Relation Age of Onset  . Hypertension Mother   . Hyperlipidemia Mother   . Diabetes Father     Social History Social History   Tobacco Use  . Smoking status: Current Some Day Smoker    Packs/day: 0.50    Years: 15.00    Pack years: 7.50  . Smokeless tobacco: Never Used  Substance Use Topics  . Alcohol use: Yes    Alcohol/week: 0.0 standard drinks  . Drug use: Yes    Types: Methamphetamines, MDMA (Ecstacy), Cocaine     Allergies   Patient has no known allergies.   Review of Systems Review of Systems  Constitutional: Negative for chills and fever.  Gastrointestinal: Positive for abdominal pain and nausea. Negative for abdominal distention, constipation, diarrhea and vomiting.  Genitourinary: Negative for decreased urine volume, difficulty urinating, dysuria, scrotal swelling and testicular pain.  Skin: Negative for rash and wound.  Allergic/Immunologic: Negative for immunocompromised state.  Hematological: Negative for adenopathy. Does not bruise/bleed easily.  All other systems reviewed and are negative.    Physical Exam Updated Vital Signs BP 135/71   Pulse 76   Temp 97.8 F (36.6 C) (Oral)   Resp 16   SpO2 98%   Physical Exam  Constitutional: He  is oriented to person, place, and time. He appears well-developed and well-nourished. No distress.  HENT:  Head: Normocephalic and atraumatic.  Cardiovascular: Normal rate, regular rhythm, normal heart sounds and intact distal pulses.  No murmur heard. Pulmonary/Chest: Effort normal and breath sounds normal. No respiratory distress.  Abdominal: Soft. He exhibits no distension. There is tenderness in the left lower quadrant.  Genitourinary:     Neurological: He is alert and oriented to person, place, and time. No sensory deficit.  Skin: Skin is warm  and dry. He is not diaphoretic.  Psychiatric: He has a normal mood and affect. His behavior is normal.  Nursing note and vitals reviewed.    ED Treatments / Results  Labs (all labs ordered are listed, but only abnormal results are displayed) Labs Reviewed  COMPREHENSIVE METABOLIC PANEL - Abnormal; Notable for the following components:      Result Value   Glucose, Bld 125 (*)    ALT 61 (*)    All other components within normal limits  CBC WITH DIFFERENTIAL/PLATELET  URINALYSIS, ROUTINE W REFLEX MICROSCOPIC    EKG None  Radiology Ct Abdomen Pelvis W Contrast  Result Date: 10/22/2018 CLINICAL DATA:  Left inguinal hernia for several years now with worsening pain following yard work. EXAM: CT ABDOMEN AND PELVIS WITH CONTRAST TECHNIQUE: Multidetector CT imaging of the abdomen and pelvis was performed using the standard protocol following bolus administration of intravenous contrast. CONTRAST:  100mL OMNIPAQUE IOHEXOL 300 MG/ML  SOLN COMPARISON:  None. FINDINGS: Lower chest: Limited visualization of the lower thorax is negative for focal airspace opacity or pleural effusion. Borderline cardiomegaly.  No pericardial effusion. Hepatobiliary: Normal hepatic contour. No discrete hepatic lesions. Normal appearance of the gallbladder given degree of distention. No radiopaque gallstones. No intra or extrahepatic biliary ductal dilatation. No ascites. Pancreas: Normal appearance of the pancreas. Spleen: Normal appearance of the spleen. Note is made of 2 small splenules about the anterior tip of the spleen. Adrenals/Urinary Tract: There is symmetric enhancement and excretion of the bilateral kidneys. No definite renal stones on this postcontrast examination. No discrete renal lesions. No urine obstruction or perinephric stranding. Normal appearance the bilateral adrenal glands. Normal appearance of the urinary bladder given degree distention. Stomach/Bowel: Moderate colonic stool burden without evidence of  enteric obstruction. Feculent stool is seen within the terminal ileum which appears otherwise normal. Normal appearance of the retrocecal appendix. No pneumoperitoneum, pneumatosis or portal venous gas. Vascular/Lymphatic: Normal caliber the abdominal aorta. The major branch vessels of the abdominal aorta appear patent on this non CTA examination. No bulky retroperitoneal, mesenteric, pelvic or inguinal lymphadenopathy. Reproductive: Dystrophic calcifications within normal sized prostate gland. No free fluid in the pelvic cul-de-sac. Other: Note is made of an approximately 5.0 x 2.8 x 3.8 cm mesenteric fat containing left-sided direct inguinal hernia (coronal image 51, series 6; axial image 95, series 3). The neck of the hernia measures approximately 2.9 cm (axial image 89, series 3). There is no associated stranding about this hernia. Note is made of a very tiny periumbilical hernia. No right-sided inguinal hernia. Musculoskeletal: No acute or aggressive osseous abnormalities. Mild (under 25%) compression deformities involving the inferior endplates of the T10 and T11 vertebral bodies without associated fracture line or paraspinal hematoma. Note is made of a bone island within the right femoral neck. IMPRESSION: 1. Approximately 5 cm mesenteric fat containing left-sided direct inguinal hernia without associated mesenteric stranding. 2. Otherwise, no explanation for patient's left-sided groin pain. Specifically, no evidence of enteric or urinary  obstruction. Normal appearance of the appendix. Electronically Signed   By: Simonne Come M.D.   On: 10/22/2018 13:25    Procedures Procedures (including critical care time)  Medications Ordered in ED Medications  ondansetron (ZOFRAN) injection 4 mg (4 mg Intravenous Given 10/22/18 1051)  morphine 4 MG/ML injection 4 mg (4 mg Intravenous Given 10/22/18 1051)  sodium chloride 0.9 % bolus 1,000 mL (0 mLs Intravenous Stopped 10/22/18 1331)  HYDROmorphone (DILAUDID)  injection 1 mg (1 mg Intravenous Given 10/22/18 1201)  iohexol (OMNIPAQUE) 300 MG/ML solution 100 mL (100 mLs Intravenous Contrast Given 10/22/18 1308)     Initial Impression / Assessment and Plan / ED Course  I have reviewed the triage vital signs and the nursing notes.  Pertinent labs & imaging results that were available during my care of the patient were reviewed by me and considered in my medical decision making (see chart for details).  Clinical Course as of Oct 22 1344  Tue Oct 22, 2018  1062 37 year old male with known left inguinal hernia presents with worsening pain in the area.  Hernia not appreciated on supine exam, patient does have slight left lower abdominal tenderness.  Due to changes in his chronic her baseline level of pain a CT abdomen pelvis was ordered with contrast which demonstrates a fat-containing hernia.  CBC, CMP, urinalysis unremarkable.  Reviewed results of patient's CT scan with him including incidental findings.  Patient referred to general surgery for follow-up.  Return to emergency room for worsening or concerning symptoms.  Patient verbalized understanding of discharge instructions and plan.   [LM]    Clinical Course User Index [LM] Jeannie Fend, PA-C   Final Clinical Impressions(s) / ED Diagnoses   Final diagnoses:  Left inguinal hernia    ED Discharge Orders    None       Jeannie Fend, PA-C 10/22/18 1346    Geoffery Lyons, MD 10/22/18 1535

## 2018-10-22 NOTE — ED Notes (Signed)
CT made aware pt is ready- called for ETA states pt is third in line,. Pt informed of delay.

## 2018-10-22 NOTE — Discharge Instructions (Addendum)
Follow up with general surgery, referral given. Return to the ER for worsening or concerning symptoms. Take Motrin and Tylenol as needed as directed for pain.

## 2018-11-11 ENCOUNTER — Ambulatory Visit: Payer: Self-pay | Admitting: Surgery

## 2018-11-11 NOTE — H&P (Signed)
Tye Marylandameron W Kaigler Documented: 11/11/2018 2:04 PM Location: [facility] Patient #: [id number] DOB: Jan 22, 1981 Single / Language: [place] / Race: White Male  History of Present Illness Maisie Fus(Tkeyah Burkman A. Saliah Crisp MD; 11/11/2018 2:23 PM) Patient words: The patient presents chief complaint of swelling and pain in his left groin. He is a known left renal hernia for least 2 years. Over the last month he's had more pain in his left groin which prompted a visit to the emergency room where CT scan showed a left inguinal hernia with preperitoneal fat. He presents today for discussion of possible repair. The patient has been stable. It is made worse with lifting or overhead work. Location pain in his left groin without radiation. He has no associated nausea or vomiting the pain is described as sharp and crampy and at times burning in nature.  The patient is a 37 year old male.   Past Surgical History Marcelino Duster(Michelle R. Shon BatonBrooks, CMA; 11/11/2018 2:05 PM) Oral Surgery  Allergies Marcelino Duster(Michelle R. Brooks, CMA; 11/11/2018 2:05 PM) No Known Drug Allergies [11/11/2018]:  Medication History Marcelino Duster(Michelle R. Brooks, CMA; 11/11/2018 2:05 PM) Allergy Relief (Oral) Specific strength unknown - Active.  Social History Marcelino Duster(Michelle R. Brooks, CMA; 11/11/2018 2:05 PM) Alcohol use Occasional alcohol use. Caffeine use Carbonated beverages. Illicit drug use Remotely quit drug use. Tobacco use Current every day smoker.  Family History Marcelino Duster(Michelle R. Shon BatonBrooks, CMA; 11/11/2018 2:05 PM) Arthritis Father. Breast Cancer Mother. Diabetes Mellitus Father. Heart Disease Father. Kidney Disease Father. Respiratory Condition Father.  Other Problems Marcelino Duster(Michelle R. Brooks, CMA; 11/11/2018 2:05 PM) Anxiety Disorder Depression Gastroesophageal Reflux Disease Heart murmur Inguinal Hernia     Review of Systems [facility](Michelle R. Brooks CMA; 11/11/2018 2:05 PM) Male Genitourinary Not Present- Blood in Urine, Change in Urinary Stream,  Frequency, Impotence, Nocturia, Painful Urination, Urgency and Urine Leakage. Neurological Present- Numbness. Not Present- Decreased Memory, Fainting, Headaches, Seizures, Tingling, Tremor, Trouble walking and Weakness.  Vitals [employer](Michelle R. Brooks CMA; 11/11/2018 2:05 PM) 11/11/2018 2:05 PM Weight: 243.13 lb Height: 75in Body Surface Area: 2.38 m Body Mass Index: 30.39 kg/m  BP: 110/76 (Sitting, Left Arm, Standard)      Physical Exam (Atiyah Bauer A. Aisa Schoeppner MD; 11/11/2018 2:23 PM)  General Mental Status-Alert. General Appearance-Consistent with stated age. Hydration-Well hydrated. Voice-Normal.  Head and Neck Head-normocephalic, atraumatic with no lesions or palpable masses. Trachea-midline.  Chest and Lung Exam Chest and lung exam reveals -quiet, even and easy respiratory effort with no use of accessory muscles and on auscultation, normal breath sounds, no adventitious sounds and normal vocal resonance. Inspection Chest Wall - Normal. Back - normal.  Cardiovascular Cardiovascular examination reveals -normal heart sounds, regular rate and rhythm with no murmurs and normal pedal pulses bilaterally.  Abdomen Note: Reducible left inguinal hernia. No evidence of right inguinal hernia.  Male Genitourinary Note: Penis testicles normal  Neurologic Neurologic evaluation reveals -alert and oriented x 3 with no impairment of recent or remote memory. Mental Status-Normal.  Musculoskeletal Normal Exam - Left-Upper Extremity Strength Normal and Lower Extremity Strength Normal. Normal Exam - Right-Upper Extremity Strength Normal, Lower Extremity Weakness.    Assessment & Plan (Franz Svec A. Barclay Lennox MD; 11/11/2018 2:22 PM)  LEFT INGUINAL HERNIA (K40.90) Impression: The risk of hernia repair include bleeding, infection, organ injury, bowel injury, bladder injury, nerve injury recurrent hernia, blood clots, worsening of underlying condition, chronic pain, mesh  use, open surgery, death, and the need for other operattions. Pt agrees to proceed Recommend repair of his left inguinal hernia. Laparoscopic and open  techniques and the use of mesh discussed.  Current Plans You are being scheduled for surgery- Our schedulers will call you.  You should hear from our office's scheduling department within 5 working days about the location, date, and time of surgery. We try to make accommodations for patient's preferences in scheduling surgery, but sometimes the OR schedule or the surgeon's schedule prevents [place] from making those accommodations.  If you have not heard from our office ([phone]) in 5 working days, call the office and ask for your surgeon's nurse.  If you have other questions about your diagnosis, plan, or surgery, call the office and ask for your surgeon's nurse.  Pt Education - Pamphlet Given - Hernia Surgery: discussed with patient and provided information. The anatomy & physiology of the abdominal wall and pelvic floor was discussed. The pathophysiology of hernias in the inguinal and pelvic region was discussed. Natural history risks such as progressive enlargement, pain, incarceration, and strangulation was discussed. Contributors to complications such as smoking, obesity, diabetes, prior surgery, etc were discussed.  I feel the risks of no intervention will lead to serious problems that outweigh the operative risks; therefore, I recommended surgery to reduce and repair the hernia. I explained an open approach. I noted usual use of mesh to patch and/or buttress hernia repair  Risks such as bleeding, infection, abscess, need for further treatment, heart attack, death, and other risks were discussed. I noted a good likelihood this will help address the problem. Goals of post-operative recovery were discussed as well. Possibility that this will not correct all symptoms was explained. I stressed the importance of low-impact activity,  aggressive pain control, avoiding constipation, & not pushing through pain to minimize risk of post-operative chronic pain or injury. Possibility of reherniation was discussed. We will work to minimize complications.  An educational handout further explaining the pathology & treatment options was given as well. Questions were answered. The patient expresses understanding & wishes to proceed with surgery.  Pt Education - Consent for inguinal hernia - [place]: discussed with patient and provided information. Pt Education - CCS Mesh education: discussed with patient and provided information.

## 2019-02-26 ENCOUNTER — Emergency Department (HOSPITAL_COMMUNITY): Payer: Self-pay

## 2019-02-26 ENCOUNTER — Encounter (HOSPITAL_COMMUNITY): Payer: Self-pay | Admitting: Emergency Medicine

## 2019-02-26 ENCOUNTER — Emergency Department (HOSPITAL_COMMUNITY)
Admission: EM | Admit: 2019-02-26 | Discharge: 2019-02-26 | Disposition: A | Discharge status: Home / Self Care | Payer: Self-pay | Attending: Emergency Medicine | Admitting: Emergency Medicine

## 2019-02-26 DIAGNOSIS — Z79899 Other long term (current) drug therapy: Secondary | ICD-10-CM | POA: Insufficient documentation

## 2019-02-26 DIAGNOSIS — R062 Wheezing: Secondary | ICD-10-CM | POA: Insufficient documentation

## 2019-02-26 DIAGNOSIS — F17210 Nicotine dependence, cigarettes, uncomplicated: Secondary | ICD-10-CM | POA: Insufficient documentation

## 2019-02-26 DIAGNOSIS — T50901A Poisoning by unspecified drugs, medicaments and biological substances, accidental (unintentional), initial encounter: Secondary | ICD-10-CM | POA: Insufficient documentation

## 2019-02-26 MED ORDER — IPRATROPIUM-ALBUTEROL 0.5-2.5 (3) MG/3ML IN SOLN
3.0000 mL | Freq: Once | RESPIRATORY_TRACT | Status: AC
  Administered 2019-02-26: 3 mL via RESPIRATORY_TRACT
  Filled 2019-02-26: qty 3

## 2019-02-26 MED ORDER — NALOXONE HCL 4 MG/0.1ML NA LIQD
1.0000 | Freq: Once | NASAL | Status: AC
  Administered 2019-02-26: 1 via NASAL
  Filled 2019-02-26: qty 4

## 2019-02-26 MED ORDER — ALBUTEROL SULFATE HFA 108 (90 BASE) MCG/ACT IN AERS
2.0000 | INHALATION_SPRAY | Freq: Once | RESPIRATORY_TRACT | Status: AC
  Administered 2019-02-26: 2 via RESPIRATORY_TRACT
  Filled 2019-02-26: qty 6.7

## 2019-02-26 NOTE — ED Triage Notes (Signed)
Per EMS ,, pt. From his hotel reported of respiratory distress. Per pt.'s friend ,pt  became agonal after using heroin at 0715 this evening. Pt.'s friend gave  2.4mg  of Narcan and reported that pt. Vomited after receiving the narcan. Per EMS, pt. Was at 84% O2 sat. On room air, with wheezing and rhonchi sound on bil lungs. Pt. Was also reported of cough. O2 sat % of 91% @ 5L/min of [place]. A/O x 4.

## 2019-02-26 NOTE — ED Notes (Signed)
Bed: VE55 Expected date:  Expected time:  Means of arrival:  Comments: 76M possible aspiration

## 2019-02-26 NOTE — ED Provider Notes (Signed)
[facility]-EMERGENCY DEPT Provider Note   CSN: [id number] Arrival date & time: 02/26/19  2004    History   Chief Complaint Chief Complaint  Patient presents with  . Respiratory Distress    HPI Terrence Henry is a 38 y.o. male.     38 yo M with a chief complaint of shortness of breath.  Patient overdosed on narcotics and required doses of Narcan to wake up.  During that event he vomited and there was some thought that he aspirated.  EMS was concerned of the patient having significant shortness of breath and so recommended strongly that he came to the ED for evaluation.  Since then he has coughed quite a few times and feels that he is breathing much easier.  Still feels like maybe something is in his lungs but is breathing comfortably.  Denies recent illness does have a history of asthma and smokes cigarettes.  The history is provided by the patient.  Illness  Severity:  Severe Onset quality:  Sudden Duration:  1 hour Timing:  Constant Progression:  Partially resolved Chronicity:  New Associated symptoms: congestion, cough and shortness of breath   Associated symptoms: no abdominal pain, no chest pain, no diarrhea, no fever, no headaches, no myalgias, no rash and no vomiting     History reviewed. No pertinent past medical history.  Patient Active Problem List   Diagnosis Date Noted  . History of substance abuse (HCC) 03/27/2016  . Irregular cardiac rhythm 03/27/2016  . TOBACCO ABUSE 03/12/2008  . VENEREAL WART 12/27/2007  . ANXIETY 02/07/2007  . PANIC ATTACKS 02/07/2007    Past Surgical History:  Procedure Laterality Date  . MANDIBLE FRACTURE SURGERY     Altercation        Home Medications    Prior to Admission medications   Medication Sig Start Date End Date Taking? Authorizing Provider  diphenhydrAMINE (BENADRYL) 25 mg capsule Take 25 mg by mouth every 6 (six) hours as needed for allergies.    [provider]  fluconazole  (DIFLUCAN) 150 MG tablet Take 2 tablets today. Repeat in 1 week. Patient not taking: Reported on 10/22/2018 03/27/16   Porfirio Oar, PA  HYDROcodone-acetaminophen (NORCO) 10-325 MG tablet Take 1 tablet by mouth every 6 (six) hours as needed for moderate pain.    [provider]  loratadine (CLARITIN) 10 MG tablet Take 10 mg by mouth daily as needed for allergies.    [provider]    Family History Family History  Problem Relation Age of Onset  . Hypertension Mother   . Hyperlipidemia Mother   . Diabetes Father     Social History Social History   Tobacco Use  . Smoking status: Current Some Day Smoker    Packs/day: 0.50    Years: 15.00    Pack years: 7.50  . Smokeless tobacco: Never Used  Substance Use Topics  . Alcohol use: Yes    Alcohol/week: 0.0 standard drinks  . Drug use: Yes    Types: Methamphetamines, MDMA (Ecstacy), Cocaine     Allergies   Patient has no known allergies.   Review of Systems Review of Systems  Constitutional: Negative for chills and fever.  HENT: Positive for congestion. Negative for facial swelling.   Eyes: Negative for discharge and visual disturbance.  Respiratory: Positive for cough and shortness of breath.   Cardiovascular: Negative for chest pain and palpitations.  Gastrointestinal: Negative for abdominal pain, diarrhea and vomiting.  Musculoskeletal: Negative for arthralgias and myalgias.  Skin: Negative for color change and rash.  Neurological: Negative for tremors, syncope and headaches.  Psychiatric/Behavioral: Negative for confusion and dysphoric mood.     Physical Exam Updated Vital Signs BP 123/69 (BP Location: Left Arm)   Pulse 71   Temp 97.7 F (36.5 C) (Oral)   Resp 15   Ht 6\' 3"  (1.905 m)   Wt 99.8 kg   SpO2 96%   BMI 27.50 kg/m   Physical Exam Vitals signs and nursing note reviewed.  Constitutional:      Appearance: He is well-developed.  HENT:     Head: Normocephalic and atraumatic.   Eyes:     Pupils: Pupils are equal, round, and reactive to light.  Neck:     Musculoskeletal: Normal range of motion and neck supple.     Vascular: No JVD.  Cardiovascular:     Rate and Rhythm: Normal rate and regular rhythm.     Heart sounds: No murmur. No friction rub. No gallop.   Pulmonary:     Effort: No respiratory distress.     Breath sounds: Wheezing (diffuse with prolonged expiration) present.  Abdominal:     General: There is no distension.     Tenderness: There is no guarding or rebound.  Musculoskeletal: Normal range of motion.  Skin:    Coloration: Skin is not pale.     Findings: No rash.  Neurological:     Mental Status: He is alert and oriented to person, place, and time.  Psychiatric:        Behavior: Behavior normal.      ED Treatments / Results  Labs (all labs ordered are listed, but only abnormal results are displayed) Labs Reviewed - No data to display  EKG None  Radiology Dg Chest 2 View  Result Date: 02/26/2019 CLINICAL DATA:  OD, short of breath EXAM: CHEST - 2 VIEW COMPARISON:  None. FINDINGS: The heart size and mediastinal contours are within normal limits. Both lungs are clear. The visualized skeletal structures are unremarkable. IMPRESSION: No active cardiopulmonary disease. Electronically Signed   By: Jasmine Pang M.D.   On: 02/26/2019 20:53    Procedures Procedures (including critical care time)  Medications Ordered in ED Medications  naloxone (NARCAN) nasal spray 4 mg/0.1 mL (has no administration in time range)  albuterol (PROVENTIL HFA;VENTOLIN HFA) 108 (90 Base) MCG/ACT inhaler 2 puff (has no administration in time range)  ipratropium-albuterol (DUONEB) 0.5-2.5 (3) MG/3ML nebulizer solution 3 mL (3 mLs Nebulization Given 02/26/19 2103)     Initial Impression / Assessment and Plan / ED Course  I have reviewed the triage vital signs and the nursing notes.  Pertinent labs & imaging results that were available during my care of the  patient were reviewed by me and considered in my medical decision making (see chart for details).        38 yo M with a chief complaints of possible aspiration.  The patient had done narcotics and needed multiple doses of Narcan.  He had apparently vomited and there was thought that maybe he aspirated.  Had some real significant issues breathing initially but has improved with coughing.  He feels quite a bit better.  Diffuse wheezes on my exam.  Will obtain a chest x-ray give a DuoNeb and reassess.  Patient has improved mildly with breathing treatments.  Plain film viewed by me without focal infiltrate.  His oxygen saturation is mildly low.  He was observed here for an hour and a half without recurrent  obtundation.  We will discharge the patient home.  As he had some mild improvement with the DuoNeb we will given him inhaler.  9:36 PM:  I have discussed the diagnosis/risks/treatment options with the patient and believe the pt to be eligible for discharge home to follow-up with PCP. We also discussed returning to the ED immediately if new or worsening sx occur. We discussed the sx which are most concerning (e.g., sudden worsening sob, fever, inability to tolerate by mouth) that necessitate immediate return. Medications administered to the patient during their visit and any new prescriptions provided to the patient are listed below.  Medications given during this visit Medications  naloxone (NARCAN) nasal spray 4 mg/0.1 mL (has no administration in time range)  albuterol (PROVENTIL HFA;VENTOLIN HFA) 108 (90 Base) MCG/ACT inhaler 2 puff (has no administration in time range)  ipratropium-albuterol (DUONEB) 0.5-2.5 (3) MG/3ML nebulizer solution 3 mL (3 mLs Nebulization Given 02/26/19 2103)     The patient appears reasonably screen and/or stabilized for discharge and I doubt any other medical condition or other [facility] requiring further screening, evaluation, or treatment in the ED at this time prior to  discharge.    Final Clinical Impressions(s) / ED Diagnoses   Final diagnoses:  Accidental drug overdose, initial encounter    ED Discharge Orders    None       Melene Plan, DO 02/26/19 2136

## 2019-02-26 NOTE — Discharge Instructions (Addendum)
There is help if you need it.  Please do not use dirty needles, this could cause you a severe infection to your skin, heart or spinal cord.  This could kill you or leave you permanently disabled.    As we discussed there was a recent study that was done in the [place] part of [place] that showed the people that overdosed on narcotics and required reversal with Narcan had a 12% chance of dying in the next year.  Please be careful.  [facility] Solution to the Opioid Problem (GCSTOP) Fixed; mobile; peer-based Chase Holleman [phone] [email or web address] Fixed site exchange at [facility], Wednesdays (2-5pm) and Thursdays (3-8pm). [address] [place], [place] [address] Call or text to arrange mobile and peer exchange, Mondays (1-4pm) and Fridays (4-7pm). Serving [place]

## 2019-06-29 ENCOUNTER — Inpatient Hospital Stay (HOSPITAL_COMMUNITY)
Admission: EM | Admit: 2019-06-29 | Discharge: 2019-07-02 | DRG: 556 | Disposition: A | Discharge status: Home / Self Care | Payer: Self-pay | Attending: Internal Medicine | Admitting: Internal Medicine

## 2019-06-29 ENCOUNTER — Other Ambulatory Visit: Payer: Self-pay

## 2019-06-29 ENCOUNTER — Emergency Department (HOSPITAL_COMMUNITY): Payer: Self-pay

## 2019-06-29 ENCOUNTER — Encounter (HOSPITAL_COMMUNITY): Payer: Self-pay

## 2019-06-29 DIAGNOSIS — M60851 Other myositis, right thigh: Principal | ICD-10-CM

## 2019-06-29 DIAGNOSIS — Z1159 Encounter for screening for other viral diseases: Secondary | ICD-10-CM

## 2019-06-29 DIAGNOSIS — Z8249 Family history of ischemic heart disease and other diseases of the circulatory system: Secondary | ICD-10-CM

## 2019-06-29 DIAGNOSIS — Z833 Family history of diabetes mellitus: Secondary | ICD-10-CM

## 2019-06-29 DIAGNOSIS — R52 Pain, unspecified: Secondary | ICD-10-CM

## 2019-06-29 DIAGNOSIS — Z8349 Family history of other endocrine, nutritional and metabolic diseases: Secondary | ICD-10-CM

## 2019-06-29 DIAGNOSIS — M6282 Rhabdomyolysis: Secondary | ICD-10-CM | POA: Diagnosis present

## 2019-06-29 DIAGNOSIS — F1410 Cocaine abuse, uncomplicated: Secondary | ICD-10-CM | POA: Diagnosis present

## 2019-06-29 DIAGNOSIS — F1510 Other stimulant abuse, uncomplicated: Secondary | ICD-10-CM | POA: Diagnosis present

## 2019-06-29 DIAGNOSIS — F17210 Nicotine dependence, cigarettes, uncomplicated: Secondary | ICD-10-CM | POA: Diagnosis present

## 2019-06-29 DIAGNOSIS — F1911 Other psychoactive substance abuse, in remission: Secondary | ICD-10-CM | POA: Diagnosis present

## 2019-06-29 DIAGNOSIS — M7989 Other specified soft tissue disorders: Secondary | ICD-10-CM

## 2019-06-29 DIAGNOSIS — F1110 Opioid abuse, uncomplicated: Secondary | ICD-10-CM | POA: Diagnosis present

## 2019-06-29 DIAGNOSIS — F17200 Nicotine dependence, unspecified, uncomplicated: Secondary | ICD-10-CM | POA: Diagnosis present

## 2019-06-29 HISTORY — DX: Poisoning by unspecified drugs, medicaments and biological substances, accidental (unintentional), initial encounter: T50.901A

## 2019-06-29 LAB — CBC WITH DIFFERENTIAL/PLATELET
Abs Immature Granulocytes: 0.09 10*3/uL — ABNORMAL HIGH (ref 0.00–0.07)
Basophils Absolute: 0.1 10*3/uL (ref 0.0–0.1)
Basophils Relative: 0 %
Eosinophils Absolute: 0.3 10*3/uL (ref 0.0–0.5)
Eosinophils Relative: 2 %
HCT: 43.1 % (ref 39.0–52.0)
Hemoglobin: 14 g/dL (ref 13.0–17.0)
Immature Granulocytes: 1 %
Lymphocytes Relative: 13 %
Lymphs Abs: 2.1 10*3/uL (ref 0.7–4.0)
MCH: 29.9 pg (ref 26.0–34.0)
MCHC: 32.5 g/dL (ref 30.0–36.0)
MCV: 92.1 fL (ref 80.0–100.0)
Monocytes Absolute: 1.3 10*3/uL — ABNORMAL HIGH (ref 0.1–1.0)
Monocytes Relative: 8 %
Neutro Abs: 12.4 10*3/uL — ABNORMAL HIGH (ref 1.7–7.7)
Neutrophils Relative %: 76 %
Platelets: 145 10*3/uL — ABNORMAL LOW (ref 150–400)
RBC: 4.68 MIL/uL (ref 4.22–5.81)
RDW: 12.3 % (ref 11.5–15.5)
WBC: 16.2 10*3/uL — ABNORMAL HIGH (ref 4.0–10.5)
nRBC: 0 % (ref 0.0–0.2)

## 2019-06-29 LAB — RAPID URINE DRUG SCREEN, HOSP PERFORMED
Amphetamines: POSITIVE — AB
Barbiturates: NOT DETECTED
Benzodiazepines: NOT DETECTED
Cocaine: NOT DETECTED
Opiates: POSITIVE — AB
Tetrahydrocannabinol: POSITIVE — AB

## 2019-06-29 LAB — BASIC METABOLIC PANEL
Anion gap: 6 (ref 5–15)
BUN: 14 mg/dL (ref 6–20)
CO2: 28 mmol/L (ref 22–32)
Calcium: 9.1 mg/dL (ref 8.9–10.3)
Chloride: 102 mmol/L (ref 98–111)
Creatinine, Ser: 0.79 mg/dL (ref 0.61–1.24)
GFR calc Af Amer: >60 mL/min (ref >60)
GFR calc non Af Amer: >60 mL/min (ref >60)
Glucose, Bld: 127 mg/dL — ABNORMAL HIGH (ref 70–99)
Potassium: 4.2 mmol/L (ref 3.5–5.1)
Sodium: 136 mmol/L (ref 135–145)

## 2019-06-29 LAB — LACTIC ACID, PLASMA: Lactic Acid, Venous: 1 mmol/L (ref 0.5–1.9)

## 2019-06-29 LAB — CK: Total CK: 657 U/L — ABNORMAL HIGH (ref 49–397)

## 2019-06-29 MED ORDER — MORPHINE SULFATE (PF) 4 MG/ML IV SOLN
4.0000 mg | Freq: Once | INTRAVENOUS | Status: AC
  Administered 2019-06-29: 4 mg via INTRAVENOUS
  Filled 2019-06-29: qty 1

## 2019-06-29 MED ORDER — ENOXAPARIN SODIUM 40 MG/0.4ML ~~LOC~~ SOLN
40.0000 mg | SUBCUTANEOUS | Status: DC
  Administered 2019-06-30 – 2019-07-02 (×3): 40 mg via SUBCUTANEOUS
  Filled 2019-06-29 (×3): qty 0.4

## 2019-06-29 MED ORDER — SODIUM CHLORIDE 0.9 % IV BOLUS
1000.0000 mL | Freq: Once | INTRAVENOUS | Status: AC
  Administered 2019-06-29: 1000 mL via INTRAVENOUS

## 2019-06-29 MED ORDER — METHYLPREDNISOLONE SODIUM SUCC 125 MG IJ SOLR
125.0000 mg | Freq: Once | INTRAMUSCULAR | Status: AC
  Administered 2019-06-29: 125 mg via INTRAVENOUS
  Filled 2019-06-29: qty 2

## 2019-06-29 MED ORDER — IOHEXOL 350 MG/ML SOLN
100.0000 mL | Freq: Once | INTRAVENOUS | Status: AC | PRN
  Administered 2019-06-29: 100 mL via INTRAVENOUS

## 2019-06-29 MED ORDER — ONDANSETRON HCL 4 MG/2ML IJ SOLN
4.0000 mg | Freq: Once | INTRAMUSCULAR | Status: AC
  Administered 2019-06-29: 4 mg via INTRAVENOUS
  Filled 2019-06-29: qty 2

## 2019-06-29 MED ORDER — VANCOMYCIN HCL 10 G IV SOLR
2000.0000 mg | Freq: Once | INTRAVENOUS | Status: AC
  Administered 2019-06-29: 2000 mg via INTRAVENOUS
  Filled 2019-06-29: qty 2000

## 2019-06-29 MED ORDER — SODIUM CHLORIDE (PF) 0.9 % IJ SOLN
INTRAMUSCULAR | Status: AC
  Administered 2019-06-29: 22:00:00
  Filled 2019-06-29: qty 50

## 2019-06-29 MED ORDER — ACETAMINOPHEN 325 MG PO TABS
650.0000 mg | ORAL_TABLET | Freq: Once | ORAL | Status: AC
  Administered 2019-06-29: 650 mg via ORAL
  Filled 2019-06-29: qty 2

## 2019-06-29 MED ORDER — SODIUM CHLORIDE 0.9 % IV SOLN
INTRAVENOUS | Status: DC
  Administered 2019-06-30 – 2019-07-01 (×4): via INTRAVENOUS
  Administered 2019-07-02: 150 mL/h via INTRAVENOUS

## 2019-06-29 NOTE — ED Notes (Addendum)
Accepting floor nurse requested, per policy for airborne precautions, that pt not be transported to floor until COVID swab results.

## 2019-06-29 NOTE — ED Notes (Signed)
Gave patient gown and sheet so he can remove everything from the waist down for his Vascular Ultrasound.

## 2019-06-29 NOTE — ED Provider Notes (Signed)
[facility]-EMERGENC[facility] DEPT Provider Note   CSN: [id number] Arrival date & time: 06/29/19  1655    History   Chief Complaint Chief Complaint  Patient presents with   Leg Pain    HPI Terrence Henry is a 38 y.o. male with past medical history of substance abuse presents to emergency department today with chief complaint of right leg pain x2 days.  Patient states he has pain to his right thigh that is sharp and constant.  He rates the pain 10 of 10 in severity.  He attempted to control his pain by eating heroin and taking Advil.  This did not help.  He denies injecting heroin.  He denies any injury to the leg or history of similar pain.  The pain does not radiate. Pain is worse with movement. He also thinks the thigh looks swollen.  He has been able to walk and bear weight but states he has been limping because of the pain.  He denies fever, chills, shortness of breath, palpitations, chest pain, urinary symptoms, back pain, diarrhea.  History provided by patient with additional history obtained from chart review.       Past Medical History:  Diagnosis Date   Overdose     Patient Active Problem List   Diagnosis Date Noted   History of substance abuse (HCC) 03/27/2016   Irregular cardiac rhythm 03/27/2016   TOBACCO ABUSE 03/12/2008   VENEREAL WART 12/27/2007   ANXIETY 02/07/2007   PANIC ATTACKS 02/07/2007    Past Surgical History:  Procedure Laterality Date   MANDIBLE FRACTURE SURGERY     Altercation        Home Medications    Prior to Admission medications   Medication Sig Start Date End Date Taking? Authorizing Provider  fluconazole (DIFLUCAN) 150 MG tablet Take 2 tablets today. Repeat in 1 week. Patient not taking: Reported on 10/22/2018 03/27/16   Porfirio OarJeffery, [place], PA    Family History Family History  Problem Relation Age of Onset   Hypertension Mother    Hyperlipidemia Mother    Diabetes Father     Social History Social  History   Tobacco Use   Smoking status: Current Some Day Smoker    Packs/day: 0.50    Years: 15.00    Pack years: 7.50   Smokeless tobacco: Never Used  Substance Use Topics   Alcohol use: Yes    Alcohol/week: 0.0 standard drinks   Drug use: Yes    Types: Methamphetamines, MDMA (Ecstacy), Cocaine     Allergies   Patient has no known allergies.   Review of Systems Review of Systems  Constitutional: Negative for chills and fever.  HENT: Negative for congestion, rhinorrhea, sinus pressure and sore throat.   Eyes: Negative for pain and redness.  Respiratory: Negative for cough, shortness of breath and wheezing.   Cardiovascular: Negative for chest pain and palpitations.  Gastrointestinal: Negative for abdominal pain, constipation, diarrhea, nausea and vomiting.  Genitourinary: Negative for dysuria.  Musculoskeletal: Positive for arthralgias. Negative for back pain, myalgias and neck pain.  Skin: Negative for rash and wound.  Neurological: Negative for dizziness, syncope, weakness, numbness and headaches.  Psychiatric/Behavioral: Negative for confusion.     Physical Exam Updated Vital Signs BP 139/82 (BP Location: Right Arm)    Pulse 91    Temp 98.2 F (36.8 C) (Oral)    Resp 18    SpO2 100%   Physical Exam Vitals signs and nursing note reviewed.  Constitutional:  General: He is not in acute distress.    Appearance: He is not ill-appearing.  HENT:     Head: Normocephalic and atraumatic.     Right Ear: Tympanic membrane and external ear normal.     Left Ear: Tympanic membrane and external ear normal.     Nose: Nose normal.     Mouth/Throat:     Mouth: Mucous membranes are moist.     Pharynx: Oropharynx is clear.  Eyes:     General: No scleral icterus.       Right eye: No discharge.        Left eye: No discharge.     Extraocular Movements: Extraocular movements intact.     Conjunctiva/sclera: Conjunctivae normal.     Pupils: Pupils are equal, round, and  reactive to light.  Neck:     Musculoskeletal: Normal range of motion.     Vascular: No JVD.  Cardiovascular:     Rate and Rhythm: Normal rate and regular rhythm.     Pulses: Normal pulses.          Radial pulses are 2+ on the right side and 2+ on the left side.       Dorsalis pedis pulses are 2+ on the right side and 2+ on the left side.     Heart sounds: Normal heart sounds.  Pulmonary:     Comments: Lungs clear to auscultation in all fields. Symmetric chest rise. No wheezing, rales, or rhonchi. Abdominal:     Comments: Abdomen is soft, non-distended, and non-tender in all quadrants. No rigidity, no guarding. No peritoneal signs.  Musculoskeletal: Normal range of motion.     Comments: Pelvis is stable. Anterior aspect of right thigh is warm to the touch, tender, and mildly swollen in comparison to the left. Right knee has decreased range of motion secondary to pain, no bony tenderness. Full ROM of right ankle, cap refill <2 seconds, able to wiggle all toes. There are small wounds on right thigh approximately 0.5 cm x 0.5 cm with surrounding erythema, no drainage, induration or fluctuance   Skin:    General: Skin is warm and dry.     Capillary Refill: Capillary refill takes less than 2 seconds.     Comments: No track marks seen on bilateral arms  Neurological:     Mental Status: He is oriented to person, place, and time.     GCS: GCS eye subscore is 4. GCS verbal subscore is 5. GCS motor subscore is 6.     Comments: Fluent speech, no facial droop.  Psychiatric:        Behavior: Behavior normal.      ED Treatments / Results  Labs (all labs ordered are listed, but only abnormal results are displayed) Labs Reviewed  CBC WITH DIFFERENTIAL/PLATELET - Abnormal; Notable for the following components:      Result Value   WBC 16.2 (*)    Platelets 145 (*)    Neutro Abs 12.4 (*)    Monocytes Absolute 1.3 (*)    Abs Immature Granulocytes 0.09 (*)    All other components within normal  limits  BASIC METABOLIC PANEL - Abnormal; Notable for the following components:   Glucose, Bld 127 (*)    All other components within normal limits  CK - Abnormal; Notable for the following components:   Total CK 657 (*)    All other components within normal limits  SARS CORONAVIRUS 2 (HOSPITAL ORDER[facility])  LACTIC  ACID, PLASMA    EKG None  Radiology Ct Angio Low Extrem Right W &/or Wo Contrast  Result Date: 06/29/2019 CLINICAL DATA:  38 year old male with painful and cold lower extremity. History of heroin use. Pain in the lateral aspect of the proximal thigh. No trauma. EXAM: CT ANGIOGRAPHY OF THE right lowerEXTREMITY TECHNIQUE: Multidetector CT imaging of the right lowerwas performed using the standard protocol during bolus administration of intravenous contrast. Multiplanar CT image reconstructions and MIPs were obtained to evaluate the vascular anatomy. CONTRAST:  140mL OMNIPAQUE IOHEXOL 350 MG/ML SOLN COMPARISON:  None. FINDINGS: The visualized external iliac artery, common, deep, and superficial femoral arteries, popliteal artery and its trifurcation, and arteries of the calf are patent to the level of the ankle bilaterally. There is opacification of the dorsalis pedis arteries as well as plantar arteries bilaterally. Evaluation of the veins is limited on this arterial phase scan. There is heterogeneous enhancement and inflammatory changes of the musculature of the proximal anterior right thigh involving the vastus lateralis, vastus medialis and intermedius, and the rectus femoris muscles suggestive of myositis. Clinical correlation is recommended. There is stranding of the deep fascial planes of the anterior compartment of the proximal right thigh. No drainable fluid collection identified. No soft tissue gas. There is a small suprapatellar effusion on the right. There is no acute fracture or dislocation. The bones are well mineralized. No arthritic changes.  No bone erosions or evidence of acute osteomyelitis by CT. Review of the MIP images confirms the above findings. IMPRESSION: 1. No evidence of vascular occlusion or high-grade stenosis. 2. Findings most consistent with myositis of the anterior compartment of the right thigh. No drainable fluid collection/abscess. No soft tissue gas. Electronically Signed   By: Anner Crete M.D.   On: 06/29/2019 21:58   Vas [place] Lower Extremity Venous (dvt) (only [facility] & [facility])  Result Date: 06/29/2019  Lower Venous Study Indications: Pain, and Swelling in the anterior thigh and groin area.  Risk Factors: Substance abuse. Patient states he has been using heroin to help with the pain. Limitations: Patient could not tolerate compression in the thigh and groin areas secondary to extreme pain. Patient continuously holding breath during exam, secondary to pain. Comparison Study: No prior study on file for comparison Performing Technologist: Sharion Dove RVS  Examination Guidelines: A complete evaluation includes B-mode imaging, spectral Doppler, color Doppler, and power Doppler as needed of all accessible portions of each vessel. Bilateral testing is considered an integral part of a complete examination. Limited examinations for reoccurring indications may be performed as noted.  +---------+---------------+---------+-----------+----------+--------------+  RIGHT     Compressibility Phasicity Spontaneity Properties Summary         +---------+---------------+---------+-----------+----------+--------------+  CFV                       Yes       Yes                                    +---------+---------------+---------+-----------+----------+--------------+  FV Prox   Full            Yes       Yes                                    +---------+---------------+---------+-----------+----------+--------------+  FV Mid  Yes       Yes                                     +---------+---------------+---------+-----------+----------+--------------+  FV Distal                 Yes       Yes                                    +---------+---------------+---------+-----------+----------+--------------+  PFV                                                        Not visualized  +---------+---------------+---------+-----------+----------+--------------+  POP       Full            Yes       Yes                                    +---------+---------------+---------+-----------+----------+--------------+  PTV       Full                                                             +---------+---------------+---------+-----------+----------+--------------+  PERO      Full                                                             +---------+---------------+---------+-----------+----------+--------------+   +----+---------------+---------+-----------+----------+-------+  LEFT Compressibility Phasicity Spontaneity Properties Summary  +----+---------------+---------+-----------+----------+-------+  CFV                  Yes       Yes                             +----+---------------+---------+-----------+----------+-------+     Summary: Right: There is no evidence of deep vein thrombosis in the lower extremity. However, portions of this examination were limited- see technologist comments above. Tissue in the thigh appears disturbed, etiology unknown. Left: No evidence of common femoral vein obstruction. Ultrasound characteristics of enlarged lymph nodes noted in the groin.  *See table(s) above for measurements and observations.    Preliminary     Procedures Procedures (including critical care time)  Medications Ordered in ED Medications  vancomycin (VANCOCIN) 2,000 mg in sodium chloride 0.9 % 500 mL IVPB (2,000 mg Intravenous New Bag/Given 06/29/19 2254)  sodium chloride 0.9 % bolus 1,000 mL (has no administration in time range)  acetaminophen (TYLENOL) tablet 650 mg (650 mg Oral Given  06/29/19 1832)  iohexol (OMNIPAQUE) 350 MG/ML injection 100 mL (100 mLs Intravenous Contrast Given 06/29/19 2118)  sodium chloride (PF) 0.9 % injection (  Given by Other 06/29/19 2133)  methylPREDNISolone sodium succinate (SOLU-MEDROL) 125 mg/2 mL injection 125 mg (125 mg Intravenous Given 06/29/19 2250)  morphine 4 MG/ML injection 4 mg (4 mg Intravenous Given 06/29/19 2259)  ondansetron (ZOFRAN) injection 4 mg (4 mg Intravenous Given 06/29/19 2259)     Initial Impression / Assessment and Plan / ED Course  I have reviewed the triage vital signs and the nursing notes.  Pertinent labs & imaging results that were available during my care of the patient were reviewed by me and considered in my medical decision making (see chart for details).  38 yo male presents with right thigh pain x 2 days without injury. On exam he is well appearing, in no acute distress. When ambulating in the room he limps. He has two small wounds on right thigh as described above that he reports is from scratching, no signs of infection.  Ultrasound right lower extremities negative for DVT.  CTA right lower extremity shows myositis.  Lab work is remarkable for leukocytosis of 16.2. CK is 657, IVF started. Also started vancomycin and IV steroids for myositis. UDS pending. The patient was discussed with and seen by Dr. Particia Nearing who agrees with the treatment plan.  Spoke with Dr. Gerri Lins with hospitalist service who agrees to assume care of patient and bring into the hospital for further evaluation and management.    This note was prepared using Dragon voice recognition software and may include unintentional dictation errors due to the inherent limitations of voice recognition software.      Final Clinical Impressions(s) / ED Diagnoses   Final diagnoses:  Myositis of right thigh, unspecified myositis type    ED Discharge Orders    None       Kathyrn Lass 06/30/19 Shanon Payor, MD 07/01/19  1422

## 2019-06-29 NOTE — Progress Notes (Signed)
A consult was received from an ED physician for vancomycin per pharmacy dosing.  The patient's profile has been reviewed for ht/wt/allergies/indication/available labs.   A one time order has been placed for Vancomycin 2gm iv x1.  Further antibiotics/pharmacy consults should be ordered by admitting physician if indicated.                       Thank you, Nani Skillern Crowford 06/29/2019  10:31 PM

## 2019-06-29 NOTE — Progress Notes (Signed)
VASCULAR LAB PRELIMINARY  PRELIMINARY  PRELIMINARY  PRELIMINARY  Right lower extremity venous duplex completed.    Preliminary report:  See CV proc for preliminary results.  Gave report to [employer], PA-C  Clements Toro, RVT 06/29/2019, 7:21 PM

## 2019-06-29 NOTE — ED Triage Notes (Signed)
He c/o non-traumatic right lateral proximal thigh area pain since yesterday. He states his thigh is "swollen", although I do not appreciate this clinically.

## 2019-06-29 NOTE — ED Notes (Signed)
ED TO INPATIENT HANDOFF REPORT  Name/Age/Gender Tye [place]on W Yanik 38 y.o. male  Code Status    Code Status Orders  (From admission, onward)         Start     Ordered   06/29/19 2323  Full code  Continuous     06/29/19 2323        Code Status History    This patient has a current code status but no historical code status.   Advance Care Planning Activity      Home/SNF/Other Home  Chief Complaint Leg Pain  Level of Care/Admitting Diagnosis ED Disposition    ED Disposition Condition Comment   Admit  [facility] [[id number]]  Level of Care: Med-Surg [16]  Covid Evaluation: Asymptomatic Screening Protocol (No Symptoms)  Diagnosis: Myositis of right thigh, unspecified myositis type [[id number]][[id number]]  Admitting Physician: Fran LowesSWAYZE, AVA [phone][[id number]]  Attending Physician: Gerri LinsSWAYZE, AVA [[id number]]  PT Class (Do Not Modify): Observation [104]  PT Acc Code (Do Not Modify): Observation [10022]       Medical History Past Medical History:  Diagnosis Date  . Overdose     Allergies No Known Allergies  IV Location/Drains/Wounds Patient Lines/Drains/Airways Status   Active Line/Drains/Airways    Name:   Placement date:   Placement time:   Site:   Days:   Peripheral IV 06/29/19 Right Antecubital   06/29/19    2021    Antecubital   less than 1          Labs/Imaging Results for orders placed or performed during the hospital encounter of 06/29/19 (from the past 48 hour(s))  CBC with Differential     Status: Abnormal   Collection Time: 06/29/19  8:20 PM  Result Value Ref Range   WBC 16.2 (H) 4.0 - 10.5 K/uL   RBC 4.68 4.22 - 5.81 MIL/uL   Hemoglobin 14.0 13.0 - 17.0 g/dL   HCT 98.143.1 19.139.0 - 47.852.0 %   MCV 92.1 80.0 - 100.0 fL   MCH 29.9 26.0 - 34.0 pg   MCHC 32.5 30.0 - 36.0 g/dL   RDW 29.512.3 62.111.5 - 30.815.5 %   Platelets 145 (L) 150 - 400 K/uL   nRBC 0.0 0.0 - 0.2 %   Neutrophils Relative % 76 %   Neutro Abs 12.4 (H) 1.7 - 7.7 K/uL   Lymphocytes Relative 13 %    Lymphs Abs 2.1 0.7 - 4.0 K/uL   Monocytes Relative 8 %   Monocytes Absolute 1.3 (H) 0.1 - 1.0 K/uL   Eosinophils Relative 2 %   Eosinophils Absolute 0.3 0.0 - 0.5 K/uL   Basophils Relative 0 %   Basophils Absolute 0.1 0.0 - 0.1 K/uL   Immature Granulocytes 1 %   Abs Immature Granulocytes 0.09 (H) 0.00 - 0.07 K/uL    Comment: Performed at [facility], [address] [place], [place] [address]  Basic metabolic panel     Status: Abnormal   Collection Time: 06/29/19  8:20 PM  Result Value Ref Range   Sodium 136 135 - 145 mmol/L   Potassium 4.2 3.5 - 5.1 mmol/L   Chloride 102 98 - 111 mmol/L   CO2 28 22 - 32 mmol/L   Glucose, Bld 127 (H) 70 - 99 mg/dL   BUN 14 6 - 20 mg/dL   Creatinine, Ser 6.960.79 0.61 - 1.24 mg/dL   Calcium 9.1 8.9 - 29.510.3 mg/dL   GFR calc non Af Amer >60 >60 mL/min   GFR  calc Af Amer >60 >60 mL/min   Anion gap 6 5 - 15    Comment: Performed at [facility], [place] [address] [place], [place] [address]  Lactic acid, plasma     Status: None   Collection Time: 06/29/19  8:20 PM  Result Value Ref Range   Lactic Acid, Venous 1.0 0.5 - 1.9 mmol/L    Comment: Performed at [facility], [place] [address], [place], [place] [address]  CK     Status: Abnormal   Collection Time: 06/29/19  8:20 PM  Result Value Ref Range   Total CK 657 (H) 49 - 397 U/L    Comment: Performed at [facility], [place] [address] [place], [place] [address]   Ct Angio Low Extrem Right W &/or Wo Contrast  Result Date: 06/29/2019 CLINICAL DATA:  38 year old male with painful and cold lower extremity. History of heroin use. Pain in the lateral aspect of the proximal thigh. No trauma. EXAM: CT ANGIOGRAPHY OF THE right lowerEXTREMITY TECHNIQUE: Multidetector CT imaging of the right lowerwas performed using the standard protocol during bolus administration of intravenous contrast. Multiplanar CT image reconstructions and MIPs were  obtained to evaluate the vascular anatomy. CONTRAST:  122mL OMNIPAQUE IOHEXOL 350 MG/ML SOLN COMPARISON:  None. FINDINGS: The visualized external iliac artery, common, deep, and superficial femoral arteries, popliteal artery and its trifurcation, and arteries of the calf are patent to the level of the ankle bilaterally. There is opacification of the dorsalis pedis arteries as well as plantar arteries bilaterally. Evaluation of the veins is limited on this arterial phase scan. There is heterogeneous enhancement and inflammatory changes of the musculature of the proximal anterior right thigh involving the vastus lateralis, vastus medialis and intermedius, and the rectus femoris muscles suggestive of myositis. Clinical correlation is recommended. There is stranding of the deep fascial planes of the anterior compartment of the proximal right thigh. No drainable fluid collection identified. No soft tissue gas. There is a small suprapatellar effusion on the right. There is no acute fracture or dislocation. The bones are well mineralized. No arthritic changes. No bone erosions or evidence of acute osteomyelitis by CT. Review of the MIP images confirms the above findings. IMPRESSION: 1. No evidence of vascular occlusion or high-grade stenosis. 2. Findings most consistent with myositis of the anterior compartment of the right thigh. No drainable fluid collection/abscess. No soft tissue gas. Electronically Signed   By: Anner Crete M.D.   On: 06/29/2019 21:58   Vas [place] Lower Extremity Venous (dvt) (only Mc & Wl)  Result Date: 06/29/2019  Lower Venous Study Indications: Pain, and Swelling in the anterior thigh and groin area.  Risk Factors: Substance abuse. Patient states he has been using heroin to help with the pain. Limitations: Patient could not tolerate compression in the thigh and groin areas secondary to extreme pain. Patient continuously holding breath during exam, secondary to pain. Comparison Study: No prior  study on file for comparison Performing Technologist: Sharion Dove RVS  Examination Guidelines: A complete evaluation includes B-mode imaging, spectral Doppler, color Doppler, and power Doppler as needed of all accessible portions of each vessel. Bilateral testing is considered an integral part of a complete examination. Limited examinations for reoccurring indications may be performed as noted.  +---------+---------------+---------+-----------+----------+--------------+ RIGHT    CompressibilityPhasicitySpontaneityPropertiesSummary        +---------+---------------+---------+-----------+----------+--------------+ CFV                     Yes  Yes                                 +---------+---------------+---------+-----------+----------+--------------+ FV Prox  Full           Yes      Yes                                 +---------+---------------+---------+-----------+----------+--------------+ FV Mid                  Yes      Yes                                 +---------+---------------+---------+-----------+----------+--------------+ FV Distal               Yes      Yes                                 +---------+---------------+---------+-----------+----------+--------------+ PFV                                                   Not visualized +---------+---------------+---------+-----------+----------+--------------+ POP      Full           Yes      Yes                                 +---------+---------------+---------+-----------+----------+--------------+ PTV      Full                                                        +---------+---------------+---------+-----------+----------+--------------+ PERO     Full                                                        +---------+---------------+---------+-----------+----------+--------------+   +----+---------------+---------+-----------+----------+-------+  LEFTCompressibilityPhasicitySpontaneityPropertiesSummary +----+---------------+---------+-----------+----------+-------+ CFV                Yes      Yes                          +----+---------------+---------+-----------+----------+-------+     Summary: Right: There is no evidence of deep vein thrombosis in the lower extremity. However, portions of this examination were limited- see technologist comments above. Tissue in the thigh appears disturbed, etiology unknown. Left: No evidence of common femoral vein obstruction. Ultrasound characteristics of enlarged lymph nodes noted in the groin.  *See table(s) above for measurements and observations.    Preliminary     Pending Labs Unresulted Labs (From admission, onward)    Start     Ordered   07/06/19 0500  Creatinine, serum  (enoxaparin (LOVENOX)    CrCl >/= 30 ml/min)  Weekly,  R    Comments: while on enoxaparin therapy    06/29/19 2323   06/30/19 0500  Comprehensive metabolic panel  Tomorrow morning,   R     06/29/19 2323   06/30/19 0500  CBC  Tomorrow morning,   R     06/29/19 2323   06/30/19 0500  CK  Tomorrow morning,   R     06/29/19 2325   06/29/19 2321  HIV antibody (Routine Testing)  Once,   STAT     06/29/19 2323   06/29/19 2321  CBC  (enoxaparin (LOVENOX)    CrCl >/= 30 ml/min)  Once,   STAT    Comments: Baseline for enoxaparin therapy IF NOT ALREADY DRAWN.  Notify MD if PLT < 100 K.    06/29/19 2323   06/29/19 2321  Creatinine, serum  (enoxaparin (LOVENOX)    CrCl >/= 30 ml/min)  Once,   STAT    Comments: Baseline for enoxaparin therapy IF NOT ALREADY DRAWN.    06/29/19 2323   06/29/19 2314  Urine rapid drug screen (hosp performed)  ONCE - STAT,   STAT     06/29/19 2313   06/29/19 2229  SARS Coronavirus 2 (CEPHEID - Performed in [facility] lab), Hosp Order  (Asymptomatic Patients Labs)  Once,   STAT    Question:  Rule Out  Answer:  Yes   06/29/19 2228          Vitals/Pain Today's Vitals    06/29/19 1705 06/29/19 1709 06/29/19 2021 06/29/19 2300  BP:  139/82  (!) 142/78  Pulse:  91  77  Resp:  18  15  Temp:  98.2 F (36.8 C)    TempSrc:  Oral    SpO2:  100%  96%  PainSc: 9   8      Isolation Precautions No active isolations  Medications Medications  vancomycin (VANCOCIN) 2,000 mg in sodium chloride 0.9 % 500 mL IVPB (2,000 mg Intravenous New Bag/Given 06/29/19 2254)  enoxaparin (LOVENOX) injection 40 mg (has no administration in time range)  0.9 %  sodium chloride infusion (has no administration in time range)  acetaminophen (TYLENOL) tablet 650 mg (650 mg Oral Given 06/29/19 1832)  iohexol (OMNIPAQUE) 350 MG/ML injection 100 mL (100 mLs Intravenous Contrast Given 06/29/19 2118)  sodium chloride (PF) 0.9 % injection (  Given by Other 06/29/19 2133)  methylPREDNISolone sodium succinate (SOLU-MEDROL) 125 mg/2 mL injection 125 mg (125 mg Intravenous Given 06/29/19 2250)  morphine 4 MG/ML injection 4 mg (4 mg Intravenous Given 06/29/19 2259)  ondansetron (ZOFRAN) injection 4 mg (4 mg Intravenous Given 06/29/19 2259)  sodium chloride 0.9 % bolus 1,000 mL (1,000 mLs Intravenous New Bag/Given 06/29/19 2309)    Mobility walks

## 2019-06-30 ENCOUNTER — Other Ambulatory Visit: Payer: Self-pay

## 2019-06-30 ENCOUNTER — Encounter (HOSPITAL_COMMUNITY): Payer: Self-pay

## 2019-06-30 DIAGNOSIS — F1910 Other psychoactive substance abuse, uncomplicated: Secondary | ICD-10-CM

## 2019-06-30 DIAGNOSIS — M60851 Other myositis, right thigh: Principal | ICD-10-CM

## 2019-06-30 DIAGNOSIS — M6282 Rhabdomyolysis: Secondary | ICD-10-CM

## 2019-06-30 DIAGNOSIS — F17200 Nicotine dependence, unspecified, uncomplicated: Secondary | ICD-10-CM

## 2019-06-30 HISTORY — DX: Rhabdomyolysis: M62.82

## 2019-06-30 LAB — COMPREHENSIVE METABOLIC PANEL
ALT: 31 U/L (ref 0–44)
AST: 23 U/L (ref 15–41)
Albumin: 3.3 g/dL — ABNORMAL LOW (ref 3.5–5.0)
Alkaline Phosphatase: 55 U/L (ref 38–126)
Anion gap: 7 (ref 5–15)
BUN: 10 mg/dL (ref 6–20)
CO2: 23 mmol/L (ref 22–32)
Calcium: 8.5 mg/dL — ABNORMAL LOW (ref 8.9–10.3)
Chloride: 109 mmol/L (ref 98–111)
Creatinine, Ser: 0.69 mg/dL (ref 0.61–1.24)
GFR calc Af Amer: >60 mL/min (ref >60)
GFR calc non Af Amer: >60 mL/min (ref >60)
Glucose, Bld: 151 mg/dL — ABNORMAL HIGH (ref 70–99)
Potassium: 4.2 mmol/L (ref 3.5–5.1)
Sodium: 139 mmol/L (ref 135–145)
Total Bilirubin: 0.6 mg/dL (ref 0.3–1.2)
Total Protein: 6.2 g/dL — ABNORMAL LOW (ref 6.5–8.1)

## 2019-06-30 LAB — CBC
HCT: 42.7 % (ref 39.0–52.0)
Hemoglobin: 13.5 g/dL (ref 13.0–17.0)
MCH: 29.6 pg (ref 26.0–34.0)
MCHC: 31.6 g/dL (ref 30.0–36.0)
MCV: 93.6 fL (ref 80.0–100.0)
Platelets: 154 10*3/uL (ref 150–400)
RBC: 4.56 MIL/uL (ref 4.22–5.81)
RDW: 12.3 % (ref 11.5–15.5)
WBC: 15.5 10*3/uL — ABNORMAL HIGH (ref 4.0–10.5)
nRBC: 0 % (ref 0.0–0.2)

## 2019-06-30 LAB — CK: Total CK: 440 U/L — ABNORMAL HIGH (ref 49–397)

## 2019-06-30 LAB — SARS CORONAVIRUS 2 BY RT PCR (HOSPITAL ORDER, PERFORMED IN ~~LOC~~ HOSPITAL LAB): SARS Coronavirus 2: NEGATIVE

## 2019-06-30 MED ORDER — ACETAMINOPHEN 325 MG PO TABS: 650.0000 mg | ORAL_TABLET | Freq: Four times a day (QID) | ORAL | Status: DC | PRN

## 2019-06-30 MED ORDER — MORPHINE SULFATE (PF) 2 MG/ML IV SOLN
1.0000 mg | INTRAVENOUS | Status: DC | PRN
  Administered 2019-06-30 – 2019-07-02 (×10): 1 mg via INTRAVENOUS
  Filled 2019-06-30 (×10): qty 1

## 2019-06-30 MED ORDER — KETOROLAC TROMETHAMINE 30 MG/ML IJ SOLN
30.0000 mg | Freq: Four times a day (QID) | INTRAMUSCULAR | Status: DC | PRN
  Administered 2019-06-30 – 2019-07-02 (×7): 30 mg via INTRAVENOUS
  Filled 2019-06-30 (×7): qty 1

## 2019-06-30 MED ORDER — TRAMADOL HCL 50 MG PO TABS
50.0000 mg | ORAL_TABLET | Freq: Four times a day (QID) | ORAL | Status: DC | PRN
  Administered 2019-06-30: 50 mg via ORAL
  Filled 2019-06-30: qty 1

## 2019-06-30 MED ORDER — VANCOMYCIN HCL 10 G IV SOLR
1500.0000 mg | Freq: Three times a day (TID) | INTRAVENOUS | Status: DC
  Administered 2019-06-30 – 2019-07-02 (×7): 1500 mg via INTRAVENOUS
  Filled 2019-06-30 (×9): qty 1500

## 2019-06-30 MED ORDER — TRAMADOL HCL 50 MG PO TABS
100.0000 mg | ORAL_TABLET | Freq: Once | ORAL | Status: AC
  Administered 2019-06-30: 100 mg via ORAL
  Filled 2019-06-30: qty 2

## 2019-06-30 MED ORDER — MORPHINE SULFATE (PF) 2 MG/ML IV SOLN: 2.0000 mg | INTRAVENOUS | Status: DC | PRN

## 2019-06-30 NOTE — Progress Notes (Signed)
PROGRESS NOTE    Terrence Henry  TDV:[id number] DOB: [birth year] DOA: 06/29/2019 PCP: System, Pcp Not In    Brief Narrative:  38 yr old man who states that he has no know medical issues. He is an abuser of multiple drugs Methamphetamines, Heroine, MDMA, and Cocaine. He denies IV drug use, however.   The patient states that the current illness started 2 days ago with right leg pain. He states that it has gotten worse over the past two days. He states that now it is hard for him to walk. He denies that he used any needles in the leg.   He denies fevers, chills, cough, shortness of breath, vomiting, diarrhea, or constipation. He states that he did have some nausea in the last 2 days.   In the ED his vitals were within normal limits.   Assessment & Plan:   Principal Problem:   Myositis of right thigh Active Problems:   TOBACCO ABUSE   History of substance abuse (HCC)   Rhabdomyolysis  Myositis:  -Unclear etiology - Blood cultures x 2 have been drawn, pending - Continue on empiric vancomycinn.  - CT thigh reviewed personally, findings confirms myositis - Will cont with analgesia as tolerated - Renal panel reviewed. Stable at this time  Rhabdomyolysis:  - Elevated CK of 657. - Continued on  IV NS at 150cc/hr given.  - CK trending down this AM - Follow up serial CK.   Polysubstance abuse:  - Cocaine, heroine, MDMA, and methamphetamines.  -Pt denies IVDA. And claims to snort heroin  DVT prophylaxis: Lovenox subq Code Status: Full Family Communication:  Pt in room, family not at bedside Disposition Plan: Uncertain at this time  Consultants:     Procedures:     Antimicrobials: Anti-infectives (From admission, onward)   Start     Dose/Rate Route Frequency Ordered Stop   06/30/19 1000  vancomycin (VANCOCIN) 1,500 mg in sodium chloride 0.9 % 500 mL IVPB     1,500 mg 250 mL/hr over 120 Minutes Intravenous Every 8 hours 06/30/19 0353     06/29/19 2245  vancomycin  (VANCOCIN) 2,000 mg in sodium chloride 0.9 % 500 mL IVPB     2,000 mg 250 mL/hr over 120 Minutes Intravenous  Once 06/29/19 2231 06/30/19 0310      Subjective: Complaining of marked R thigh pain  Objective: Vitals:   06/29/19 2300 06/29/19 2330 06/30/19 0049 06/30/19 0618  BP: (!) 142/78 (!) 145/91 139/76 135/69  Pulse: 77 77 73 78  Resp: 15 15 18 16   Temp:   98 F (36.7 C) 98.4 F (36.9 C)  TempSrc:   Oral Oral  SpO2: 96% 99% 100% 98%  Weight:   101.7 kg   Height:   6\' 3"  (1.905 m)     Intake/Output Summary (Last 24 hours) at 06/30/2019 1428 Last data filed at 06/30/2019 1000 Gross per 24 hour  Intake 3711.57 ml  Output 1150 ml  Net 2561.57 ml   Filed Weights   06/30/19 0049  Weight: 101.7 kg    Examination:  General exam: Appears calm and comfortable  Respiratory system: Clear to auscultation. Respiratory effort normal. Cardiovascular system: S1 & S2 heard, RRR. Gastrointestinal system: Abdomen is nondistended, soft and nontender. No organomegaly or masses felt. Normal bowel sounds heard. Central nervous system: Alert and oriented. No focal neurological deficits. Extremities: Symmetric 5 x 5 power, R thigh swelling and pain Skin: No rashes, lesions  Psychiatry: Judgement and insight appear normal. Mood & affect  appropriate.   Data Reviewed: I have personally reviewed following labs and imaging studies  CBC: Recent Labs  Lab 06/29/19 2020 06/30/19 0301  WBC 16.2* 15.5*  NEUTROABS 12.4*  --   HGB 14.0 13.5  HCT 43.1 42.7  MCV 92.1 93.6  PLT 145* 154   Basic Metabolic Panel: Recent Labs  Lab 06/29/19 2020 06/30/19 0301  NA 136 139  K 4.2 4.2  CL 102 109  CO2 28 23  GLUCOSE 127* 151*  BUN 14 10  CREATININE 0.79 0.69  CALCIUM 9.1 8.5*   GFR: Estimated Creatinine Clearance: 163.4 mL/min (by C-G formula based on SCr of 0.69 mg/dL). Liver Function Tests: Recent Labs  Lab 06/30/19 0301  AST 23  ALT 31  ALKPHOS 55  BILITOT 0.6  PROT 6.2*    ALBUMIN 3.3*   No results for input(s): LIPASE, AMYLASE in the last 168 hours. No results for input(s): AMMONIA in the last 168 hours. Coagulation Profile: No results for input(s): INR, PROTIME in the last 168 hours. Cardiac Enzymes: Recent Labs  Lab 06/29/19 2020 06/30/19 0301  CKTOTAL 657* 440*   BNP (last 3 results) No results for input(s): PROBNP in the last 8760 hours. HbA1C: No results for input(s): HGBA1C in the last 72 hours. CBG: No results for input(s): GLUCAP in the last 168 hours. Lipid Profile: No results for input(s): CHOL, HDL, LDLCALC, TRIG, CHOLHDL, LDLDIRECT in the last 72 hours. Thyroid Function Tests: No results for input(s): TSH, T4TOTAL, FREET4, T3FREE, THYROIDAB in the last 72 hours. Anemia Panel: No results for input(s): VITAMINB12, FOLATE, FERRITIN, TIBC, IRON, RETICCTPCT in the last 72 hours. Sepsis Labs: Recent Labs  Lab 06/29/19 2020  LATICACIDVEN 1.0    Recent Results (from the past 240 hour(s))  SARS Coronavirus 2 (CEPHEID - Performed in [facility] hospital lab), Hosp Order     Status: None   Collection Time: 06/29/19 11:10 PM   Specimen: Nasopharyngeal Swab  Result Value Ref Range Status   SARS Coronavirus 2 NEGATIVE NEGATIVE Final    Comment: (NOTE) If result is NEGATIVE SARS-CoV-2 target nucleic acids are NOT DETECTED. The SARS-CoV-2 RNA is generally detectable in upper and lower  respiratory specimens during the acute phase of infection. The lowest  concentration of SARS-CoV-2 viral copies this assay can detect is 250  copies / mL. A negative result does not preclude SARS-CoV-2 infection  and should not be used as the sole basis for treatment or other  patient management decisions.  A negative result may occur with  improper specimen collection / handling, submission of specimen other  than nasopharyngeal swab, presence of viral mutation(s) within the  areas targeted by this assay, and inadequate number of viral copies  (<250  copies / mL). A negative result must be combined with clinical  observations, patient history, and epidemiological information. If result is POSITIVE SARS-CoV-2 target nucleic acids are DETECTED. The SARS-CoV-2 RNA is generally detectable in upper and lower  respiratory specimens dur ing the acute phase of infection.  Positive  results are indicative of active infection with SARS-CoV-2.  Clinical  correlation with patient history and other diagnostic information is  necessary to determine patient infection status.  Positive results do  not rule out bacterial infection or co-infection with other viruses. If result is PRESUMPTIVE POSTIVE SARS-CoV-2 nucleic acids MAY BE PRESENT.   A presumptive positive result was obtained on the submitted specimen  and confirmed on repeat testing.  While 2019 novel coronavirus  (SARS-CoV-2) nucleic acids may  be present in the submitted sample  additional confirmatory testing may be necessary for epidemiological  and / or clinical management purposes  to differentiate between  SARS-CoV-2 and other Sarbecovirus currently known to infect humans.  If clinically indicated additional testing with an alternate test  methodology [phone]) is advised. The SARS-CoV-2 RNA is generally  detectable in upper and lower respiratory sp ecimens during the acute  phase of infection. The expected result is Negative. Fact Sheet for Patients:  BoilerBrush.com.cy Fact Sheet for Healthcare Providers: https://pope.com/ This test is not yet approved or cleared by the [place] FDA and has been authorized for detection and/or diagnosis of SARS-CoV-2 by FDA under an Emergency Use Authorization (EUA).  This EUA will remain in effect (meaning this test can be used) for the duration of the COVID-19 declaration under Section 564(b)(1) of the Act, 21 U.S.C. section 360bbb-3(b)(1), unless the authorization is terminated or revoked  sooner. Performed at [facility], [address], [place], [place] [address]   Culture, blood (routine x 2)     Status: None (Preliminary result)   Collection Time: 06/30/19 10:43 AM   Specimen: BLOOD  Result Value Ref Range Status   Specimen Description   Final    BLOOD RIGHT ANTECUBITAL Performed at [facility], [address], [place], [place] [address]    Special Requests   Final    BOTTLES DRAWN AEROBIC AND ANAEROBIC Blood Culture adequate volume Performed at [facility], [address] [place], [place] [address]    Culture PENDING  Incomplete   Report Status PENDING  Incomplete     Radiology Studies: Ct Angio Low Extrem Right W &/or Wo Contrast  Result Date: 06/29/2019 CLINICAL DATA:  38 year old male with painful and cold lower extremity. History of heroin use. Pain in the lateral aspect of the proximal thigh. No trauma. EXAM: CT ANGIOGRAPHY OF THE right lowerEXTREMITY TECHNIQUE: Multidetector CT imaging of the right lowerwas performed using the standard protocol during bolus administration of intravenous contrast. Multiplanar CT image reconstructions and MIPs were obtained to evaluate the vascular anatomy. CONTRAST:  OMNIPAQUE IOHEXOL 350 MG/ML SOLN COMPARISON:  None. FINDINGS: The visualized external iliac artery, common, deep, and superficial femoral arteries, popliteal artery and its trifurcation, and arteries of the calf are patent to the level of the ankle bilaterally. There is opacification of the dorsalis pedis arteries as well as plantar arteries bilaterally. Evaluation of the veins is limited on this arterial phase scan. There is heterogeneous enhancement and inflammatory changes of the musculature of the proximal anterior right thigh involving the vastus lateralis, vastus medialis and intermedius, and the rectus femoris muscles suggestive of myositis. Clinical correlation is recommended. There is stranding of the deep  fascial planes of the anterior compartment of the proximal right thigh. No drainable fluid collection identified. No soft tissue gas. There is a small suprapatellar effusion on the right. There is no acute fracture or dislocation. The bones are well mineralized. No arthritic changes. No bone erosions or evidence of acute osteomyelitis by CT. Review of the MIP images confirms the above findings. IMPRESSION: 1. No evidence of vascular occlusion or high-grade stenosis. 2. Findings most consistent with myositis of the anterior compartment of the right thigh. No drainable fluid collection/abscess. No soft tissue gas. Electronically Signed   By: Elgie Collard M.D.   On: 06/29/2019 21:58   Vas [place] Lower Extremity Venous (dvt) (only Mc & Wl)  Result Date: 06/29/2019  Lower Venous Study  Indications: Pain, and Swelling in the anterior thigh and groin area.  Risk Factors: Substance abuse. Patient states he has been using heroin to help with the pain. Limitations: Patient could not tolerate compression in the thigh and groin areas secondary to extreme pain. Patient continuously holding breath during exam, secondary to pain. Comparison Study: No prior study on file for comparison Performing Technologist: Sherren Kernsandace Kanady RVS  Examination Guidelines: A complete evaluation includes B-mode imaging, spectral Doppler, color Doppler, and power Doppler as needed of all accessible portions of each vessel. Bilateral testing is considered an integral part of a complete examination. Limited examinations for reoccurring indications may be performed as noted.  +---------+---------------+---------+-----------+----------+--------------+  RIGHT     Compressibility Phasicity Spontaneity Properties Summary         +---------+---------------+---------+-----------+----------+--------------+  CFV                       Yes       Yes                                    +---------+---------------+---------+-----------+----------+--------------+  FV  Prox   Full            Yes       Yes                                    +---------+---------------+---------+-----------+----------+--------------+  FV Mid                    Yes       Yes                                    +---------+---------------+---------+-----------+----------+--------------+  FV Distal                 Yes       Yes                                    +---------+---------------+---------+-----------+----------+--------------+  PFV                                                        Not visualized  +---------+---------------+---------+-----------+----------+--------------+  POP       Full            Yes       Yes                                    +---------+---------------+---------+-----------+----------+--------------+  PTV       Full                                                             +---------+---------------+---------+-----------+----------+--------------+  PERO      Full                                                             +---------+---------------+---------+-----------+----------+--------------+   +----+---------------+---------+-----------+----------+-------+  LEFT Compressibility Phasicity Spontaneity Properties Summary  +----+---------------+---------+-----------+----------+-------+  CFV                  Yes       Yes                             +----+---------------+---------+-----------+----------+-------+     Summary: Right: There is no evidence of deep vein thrombosis in the lower extremity. However, portions of this examination were limited- see technologist comments above. Tissue in the thigh appears disturbed, etiology unknown. Left: No evidence of common femoral vein obstruction. Ultrasound characteristics of enlarged lymph nodes noted in the groin.  *See table(s) above for measurements and observations.    Preliminary     Scheduled Meds:  enoxaparin (LOVENOX) injection  40 mg Subcutaneous Q24H   Continuous Infusions:  sodium chloride Stopped  (06/30/19 0940)   vancomycin 250 mL/hr at 06/30/19 1000     LOS: 0 days   Rickey BarbaraStephen Eyad Rochford, MD [facility] Pager On Amion  If 7PM-7AM, please contact night-coverage 06/30/2019, 2:28 PM

## 2019-06-30 NOTE — Progress Notes (Signed)
Pharmacy Antibiotic Note  BRAZOS SANDOVAL is a 38 y.o. male admitted on 06/29/2019 with myositis.  Pharmacy has been consulted for Vancomycin dosing.  Plan: Vancomycin 2gm iv x1 Vancomycin 1500 mg IV Q 8 hrs. Goal AUC 400-550. Expected AUC: 473 SCr used: 0.8 (adjusted)   Height: 6\' 3"  (190.5 cm) Weight: 224 lb 3.3 oz (101.7 kg) IBW/kg (Calculated) : 84.5  Temp (24hrs), Avg:98.1 F (36.7 C), Min:98 F (36.7 C), Max:98.2 F (36.8 C)  Recent Labs  Lab 06/29/19 2020 06/30/19 0301  WBC 16.2* 15.5*  CREATININE 0.79 0.69  LATICACIDVEN 1.0  --     Estimated Creatinine Clearance: 163.4 mL/min (by C-G formula based on SCr of 0.69 mg/dL).    No Known Allergies  Antimicrobials this admission: Vancomycin 06/30/2019 >>    Dose adjustments this admission: -  Microbiology results: -  Thank you for allowing pharmacy to be a part of this patient's care.  Nani Skillern Crowford 06/30/2019 3:53 AM

## 2019-06-30 NOTE — H&P (Addendum)
Terrence Henry is an 38 y.o. male.   Chief Complaint: Right leg pain, difficulty walking HPI: The patient is a 38 yr old man who states that he has no know medical issues. He is an abuser of multiple drugs Methamphetamines, Heroine, MDMA, and Cocaine. He denies IV drug use, however.   The patient states that the current illness started 2 days ago with right leg pain. He states that it has gotten worse over the past two days. He states that now it is hard for him to walk. He denies that he used any needles in the leg.   He denies fevers, chills, cough, shortness of breath, vomiting, diarrhea, or constipation. He states that he did have some nausea in the last 2 days.   In the ED his vitals were within normal limits.   Past Medical History:  Diagnosis Date  . Overdose     Past Surgical History:  Procedure Laterality Date  . MANDIBLE FRACTURE SURGERY     Altercation    Family History  Problem Relation Age of Onset  . Hypertension Mother   . Hyperlipidemia Mother   . Diabetes Father    Social History:  reports that he has been smoking. He has a 7.50 pack-year smoking history. He has never used smokeless tobacco. He reports current alcohol use. He reports current drug use. Drugs: Methamphetamines, MDMA (Ecstacy), and Cocaine. (Not in a hospital admission)   Allergies: No Known Allergies  Pertinent items noted in HPI and remainder of comprehensive ROS otherwise negative.   General appearance: alert, cooperative and mild distress Head: Normocephalic, without obvious abnormality, atraumatic Eyes: conjunctivae/corneas clear. PERRL, EOM's intact. Fundi benign. Throat: lips, mucosa, and tongue normal; teeth and gums normal Neck: no adenopathy, no carotid bruit, no JVD, supple, symmetrical, trachea midline and thyroid not enlarged, symmetric, no tenderness/mass/nodules Resp: No increased work of breathing. No wheezes, rales, or rhonchi. No tactile fremitus. Chest wall: no  tenderness Cardio: regular rate and rhythm, S1, S2 normal, no murmur, click, rub or gallop GI: soft, non-tender; bowel sounds normal; no masses,  no organomegaly Extremities: extremities normal, atraumatic, no cyanosis or edema in the left lower extremity. The right thigh has patches of skin that are slightly erythematous. There is no real warmth, but the thigh is tender to touch. There are 3 punctate lesions in the thigh. There is also some punctate type scarring in the left thigh, but no erythema or tenderness. Pulses: 2+ and symmetric Skin: Skin color, texture, turgor normal. No rashes or lesions Lymph nodes: Cervical, supraclavicular, and axillary nodes normal. Neurologic: Alert and oriented X 3, normal strength and tone. Normal symmetric reflexes. Normal coordination and gait   Results for orders placed or performed during the hospital encounter of 06/29/19 (from the past 48 hour(s))  CBC with Differential     Status: Abnormal   Collection Time: 06/29/19  8:20 PM  Result Value Ref Range   WBC 16.2 (H) 4.0 - 10.5 K/uL   RBC 4.68 4.22 - 5.81 MIL/uL   Hemoglobin 14.0 13.0 - 17.0 g/dL   HCT 40.943.1 81.139.0 - 91.452.0 %   MCV 92.1 80.0 - 100.0 fL   MCH 29.9 26.0 - 34.0 pg   MCHC 32.5 30.0 - 36.0 g/dL   RDW 78.212.3 95.611.5 - 21.315.5 %   Platelets 145 (L) 150 - 400 K/uL   nRBC 0.0 0.0 - 0.2 %   Neutrophils Relative % 76 %   Neutro Abs 12.4 (H) 1.7 - 7.7 K/uL  Lymphocytes Relative 13 %   Lymphs Abs 2.1 0.7 - 4.0 K/uL   Monocytes Relative 8 %   Monocytes Absolute 1.3 (H) 0.1 - 1.0 K/uL   Eosinophils Relative 2 %   Eosinophils Absolute 0.3 0.0 - 0.5 K/uL   Basophils Relative 0 %   Basophils Absolute 0.1 0.0 - 0.1 K/uL   Immature Granulocytes 1 %   Abs Immature Granulocytes 0.09 (H) 0.00 - 0.07 K/uL    Comment: Performed at [facility], [place] [address] [place], [place] [address]  Basic metabolic panel     Status: Abnormal   Collection Time: 06/29/19  8:20 PM  Result Value Ref Range    Sodium 136 135 - 145 mmol/L   Potassium 4.2 3.5 - 5.1 mmol/L   Chloride 102 98 - 111 mmol/L   CO2 28 22 - 32 mmol/L   Glucose, Bld 127 (H) 70 - 99 mg/dL   BUN 14 6 - 20 mg/dL   Creatinine, Ser 0.79 0.61 - 1.24 mg/dL   Calcium 9.1 8.9 - 10.3 mg/dL   GFR calc non Af Amer >60 >60 mL/min   GFR calc Af Amer >60 >60 mL/min   Anion gap 6 5 - 15    Comment: Performed at [facility], [place] [address], [place], [place] [address]  Lactic acid, plasma     Status: None   Collection Time: 06/29/19  8:20 PM  Result Value Ref Range   Lactic Acid, Venous 1.0 0.5 - 1.9 mmol/L    Comment: Performed at [facility], [place] [address], [place], [place] [address]  CK     Status: Abnormal   Collection Time: 06/29/19  8:20 PM  Result Value Ref Range   Total CK 657 (H) 49 - 397 U/L    Comment: Performed at [facility], [place] [address], [place], [place] [address]  Urine rapid drug screen (hosp performed)     Status: Abnormal   Collection Time: 06/29/19 11:14 PM  Result Value Ref Range   Opiates POSITIVE (A) NONE DETECTED   Cocaine NONE DETECTED NONE DETECTED   Benzodiazepines NONE DETECTED NONE DETECTED   Amphetamines POSITIVE (A) NONE DETECTED   Tetrahydrocannabinol POSITIVE (A) NONE DETECTED   Barbiturates NONE DETECTED NONE DETECTED    Comment: (NOTE) DRUG SCREEN FOR MEDICAL PURPOSES ONLY.  IF CONFIRMATION IS NEEDED FOR ANY PURPOSE, NOTIFY LAB WITHIN 5 DAYS. LOWEST DETECTABLE LIMITS FOR URINE DRUG SCREEN Drug Class                     Cutoff (ng/mL) Amphetamine and metabolites    1000 Barbiturate and metabolites    200 Benzodiazepine                 573 Tricyclics and metabolites     300 Opiates and metabolites        300 Cocaine and metabolites        300 THC                            50 Performed at [facility], [place] [address], [place], [place] [address]    @[email or web address] @  Blood pressure (!) 145/91, pulse 77,  temperature 98.2 F (36.8 C), temperature source Oral, resp. rate 15, SpO2 99 %.    Assessment/Plan Myositis: Unknown cause. Blood cultures x 2 have been drawn. Pt is receiving vancomycin. Pharmacy to dose vancomycin.  Consider CT of the thigh if patient does not improve.   Rhabdomyolysis: Mild CK of 657. IV NS at 150cc/hr given. Monitor CK and creatinine.   Polysubstance abuse: Cocaine, heroine, MDMA, and methamphetamines. Denies IVDA.  I have seen and examined this patient myself. I have spent 68 minutes in his evaluation and care.  ELOS 1 day CODE STATUS: Full Code DVT Prophylaxis: Lovenox.  Tatisha Cerino 06/30/2019, 12:20 AM

## 2019-06-30 NOTE — Plan of Care (Signed)
Plan of care discussed.   

## 2019-07-01 DIAGNOSIS — F1911 Other psychoactive substance abuse, in remission: Secondary | ICD-10-CM

## 2019-07-01 LAB — BASIC METABOLIC PANEL
Anion gap: 8 (ref 5–15)
BUN: 12 mg/dL (ref 6–20)
CO2: 20 mmol/L — ABNORMAL LOW (ref 22–32)
Calcium: 8.1 mg/dL — ABNORMAL LOW (ref 8.9–10.3)
Chloride: 113 mmol/L — ABNORMAL HIGH (ref 98–111)
Creatinine, Ser: 0.58 mg/dL — ABNORMAL LOW (ref 0.61–1.24)
GFR calc Af Amer: >60 mL/min (ref >60)
GFR calc non Af Amer: >60 mL/min (ref >60)
Glucose, Bld: 90 mg/dL (ref 70–99)
Potassium: 3.7 mmol/L (ref 3.5–5.1)
Sodium: 141 mmol/L (ref 135–145)

## 2019-07-01 LAB — CBC
HCT: 36.4 % — ABNORMAL LOW (ref 39.0–52.0)
Hemoglobin: 11.6 g/dL — ABNORMAL LOW (ref 13.0–17.0)
MCH: 30.1 pg (ref 26.0–34.0)
MCHC: 31.9 g/dL (ref 30.0–36.0)
MCV: 94.3 fL (ref 80.0–100.0)
Platelets: 146 10*3/uL — ABNORMAL LOW (ref 150–400)
RBC: 3.86 MIL/uL — ABNORMAL LOW (ref 4.22–5.81)
RDW: 12.5 % (ref 11.5–15.5)
WBC: 14.6 10*3/uL — ABNORMAL HIGH (ref 4.0–10.5)
nRBC: 0 % (ref 0.0–0.2)

## 2019-07-01 LAB — HIV ANTIBODY (ROUTINE TESTING W REFLEX): HIV Screen 4th Generation wRfx: NONREACTIVE

## 2019-07-01 LAB — VANCOMYCIN, TROUGH: Vancomycin Tr: 8 ug/mL — ABNORMAL LOW (ref 15–20)

## 2019-07-01 LAB — CK: Total CK: 114 U/L (ref 49–397)

## 2019-07-01 NOTE — Progress Notes (Signed)
PROGRESS NOTE    Terrence Henry  WUJ:[id number] DOB: 07-25-1981 DOA: 06/29/2019 PCP: System, Pcp Not In    Brief Narrative:  38 yr old man who states that he has no know medical issues. He is an abuser of multiple drugs Methamphetamines, Heroine, MDMA, and Cocaine. He denies IV drug use, however.   The patient states that the current illness started 2 days ago with right leg pain. He states that it has gotten worse over the past two days. He states that now it is hard for him to walk. He denies that he used any needles in the leg.   He denies fevers, chills, cough, shortness of breath, vomiting, diarrhea, or constipation. He states that he did have some nausea in the last 2 days.   In the ED his vitals were within normal limits.   Assessment & Plan:   Principal Problem:   Myositis of right thigh Active Problems:   TOBACCO ABUSE   History of substance abuse (HCC)   Rhabdomyolysis  Myositis:  -Unclear etiology, although suspect related to drug abuse from multiple substances pt purchased off the street - Blood cultures x 2 have been drawn, neg thus far - Continue on empiric vancomycin for now - Recent CT thigh reviewed personally, findings confirms myositis - Will cont with analgesia as tolerated - Renal panel reviewed. Cr remains stable  Rhabdomyolysis:  - CK presented elevated at 657. - Continued on  IV NS with CK trending down - Repeat CK in AM  Polysubstance abuse:  - Cocaine, heroine, MDMA, and methamphetamines.  -Pt denies IVDA. And claims to snort drugs purchased off the street  DVT prophylaxis: Lovenox subq Code Status: Full Family Communication:  Pt in room, family not at bedside Disposition Plan: Uncertain at this time  Consultants:     Procedures:     Antimicrobials: Anti-infectives (From admission, onward)   Start     Dose/Rate Route Frequency Ordered Stop   06/30/19 1000  vancomycin (VANCOCIN) 1,500 mg in sodium chloride 0.9 % 500 mL IVPB     1,500 mg 250 mL/hr over 120 Minutes Intravenous Every 8 hours 06/30/19 0353     06/29/19 2245  vancomycin (VANCOCIN) 2,000 mg in sodium chloride 0.9 % 500 mL IVPB     2,000 mg 250 mL/hr over 120 Minutes Intravenous  Once 06/29/19 2231 06/30/19 0310      Subjective: Reports feeling somewhat better, R thigh remains tender  Objective: Vitals:   06/30/19 1436 06/30/19 2110 07/01/19 0500 07/01/19 1332  BP: (!) 146/86 (!) 122/59 114/68 134/71  Pulse: 71 69 81 84  Resp: 18 14 18 18   Temp: 98.2 F (36.8 C) 98.6 F (37 C) 97.9 F (36.6 C) 98.7 F (37.1 C)  TempSrc: Oral Oral Oral Oral  SpO2: 98% 98% 99% 100%  Weight:      Height:        Intake/Output Summary (Last 24 hours) at 07/01/2019 1619 Last data filed at 07/01/2019 1500 Gross per 24 hour  Intake 5702.77 ml  Output 2500 ml  Net 3202.77 ml   Filed Weights   06/30/19 0049  Weight: 101.7 kg    Examination: General exam: Awake, laying in bed, in nad Respiratory system: Normal respiratory effort, no wheezing Cardiovascular system: regular rate, s1, s2 Gastrointestinal system: Soft, nondistended, positive BS Central nervous system: CN2-12 grossly intact, strength intact Extremities: Perfused, no clubbing Skin: Normal skin turgor, no notable skin lesions seen Psychiatry: Mood normal // no visual hallucinations  Data Reviewed: I have personally reviewed following labs and imaging studies  CBC: Recent Labs  Lab 06/29/19 2020 06/30/19 0301 07/01/19 0941  WBC 16.2* 15.5* 14.6*  NEUTROABS 12.4*  --   --   HGB 14.0 13.5 11.6*  HCT 43.1 42.7 36.4*  MCV 92.1 93.6 94.3  PLT 145* 154 146*   Basic Metabolic Panel: Recent Labs  Lab 06/29/19 2020 06/30/19 0301 07/01/19 0941  NA 136 139 141  K 4.2 4.2 3.7  CL 102 109 113*  CO2 28 23 20*  GLUCOSE 127* 151* 90  BUN 14 10 12   CREATININE 0.79 0.69 0.58*  CALCIUM 9.1 8.5* 8.1*   GFR: Estimated Creatinine Clearance: 163.4 mL/min (A) (by C-G formula based on SCr of  0.58 mg/dL (L)). Liver Function Tests: Recent Labs  Lab 06/30/19 0301  AST 23  ALT 31  ALKPHOS 55  BILITOT 0.6  PROT 6.2*  ALBUMIN 3.3*   No results for input(s): LIPASE, AMYLASE in the last 168 hours. No results for input(s): AMMONIA in the last 168 hours. Coagulation Profile: No results for input(s): INR, PROTIME in the last 168 hours. Cardiac Enzymes: Recent Labs  Lab 06/29/19 2020 06/30/19 0301 07/01/19 0941  CKTOTAL 657* 440* 114   BNP (last 3 results) No results for input(s): PROBNP in the last 8760 hours. HbA1C: No results for input(s): HGBA1C in the last 72 hours. CBG: No results for input(s): GLUCAP in the last 168 hours. Lipid Profile: No results for input(s): CHOL, HDL, LDLCALC, TRIG, CHOLHDL, LDLDIRECT in the last 72 hours. Thyroid Function Tests: No results for input(s): TSH, T4TOTAL, FREET4, T3FREE, THYROIDAB in the last 72 hours. Anemia Panel: No results for input(s): VITAMINB12, FOLATE, FERRITIN, TIBC, IRON, RETICCTPCT in the last 72 hours. Sepsis Labs: Recent Labs  Lab 06/29/19 2020  LATICACIDVEN 1.0    Recent Results (from the past 240 hour(s))  SARS Coronavirus 2 (CEPHEID - Performed [facility]), Hosp Order     Status: None   Collection Time: 06/29/19 11:10 PM   Specimen: Nasopharyngeal Swab  Result Value Ref Range Status   SARS Coronavirus 2 NEGATIVE NEGATIVE Final    Comment: (NOTE) If result is NEGATIVE SARS-CoV-2 target nucleic acids are NOT DETECTED. The SARS-CoV-2 RNA is generally detectable in upper and lower  respiratory specimens during the acute phase of infection. The lowest  concentration of SARS-CoV-2 viral copies this assay can detect is 250  copies / mL. A negative result does not preclude SARS-CoV-2 infection  and should not be used as the sole basis for treatment or other  patient management decisions.  A negative result may occur with  improper specimen collection / handling, submission of specimen other    than nasopharyngeal swab, presence of viral mutation(s) within the  areas targeted by this assay, and inadequate number of viral copies  (<250 copies / mL). A negative result must be combined with clinical  observations, patient history, and epidemiological information. If result is POSITIVE SARS-CoV-2 target nucleic acids are DETECTED. The SARS-CoV-2 RNA is generally detectable in upper and lower  respiratory specimens dur ing the acute phase of infection.  Positive  results are indicative of active infection with SARS-CoV-2.  Clinical  correlation with patient history and other diagnostic information is  necessary to determine patient infection status.  Positive results do  not rule out bacterial infection or co-infection with other viruses. If result is PRESUMPTIVE POSTIVE SARS-CoV-2 nucleic acids MAY BE PRESENT.   A presumptive positive result  was obtained on the submitted specimen  and confirmed on repeat testing.  While 2019 novel coronavirus  (SARS-CoV-2) nucleic acids may be present in the submitted sample  additional confirmatory testing may be necessary for epidemiological  and / or clinical management purposes  to differentiate between  SARS-CoV-2 and other Sarbecovirus currently known to infect humans.  If clinically indicated additional testing with an alternate test  methodology ([id number]) is advised. The SARS-CoV-2 RNA is generally  detectable in upper and lower respiratory sp ecimens during the acute  phase of infection. The expected result is Negative. Fact Sheet for Patients:  https://www.fda.gov/media/136312/download Fact Sheet for Healthcare Providers: https://www.fda.gov/media/136313/download This test is not yet approved or cleared by the [place] FDA and has been authorized for detection and/or diagnosis of SARS-CoV-2 by FDA under an Emergency Use Authorization (EUA).  This EUA will remain in effect (meaning this test can be used) for the duration of  the COVID-19 declaration under Section 564(b)(1) of the Act, 21 U.S.C. section 360bbb-3(b)(1), unless the authorization is terminated or revoked sooner. Performed at [facility], [address] [place], [place] [address]   Culture, blood (routine x 2)     Status: None (Preliminary result)   Collection Time: 06/30/19 10:43 AM   Specimen: BLOOD  Result Value Ref Range Status   Specimen Description   Final    BLOOD RIGHT ANTECUBITAL Performed at [facility], [address] [place], [place] [address]    Special Requests   Final    BOTTLES DRAWN AEROBIC AND ANAEROBIC Blood Culture adequate volume Performed at [facility], [address] [place], [place] [address]    Culture   Final    NO GROWTH < 24 HOURS Performed at [facility], [address] [place], [place] [address]    Report Status PENDING  Incomplete  Culture, blood (routine x 2)     Status: None (Preliminary result)   Collection Time: 06/30/19 10:46 AM   Specimen: BLOOD RIGHT HAND  Result Value Ref Range Status   Specimen Description   Final    BLOOD RIGHT HAND Performed at [facility], [address] [place], [place] [address]    Special Requests   Final    BOTTLES DRAWN AEROBIC AND ANAEROBIC Blood Culture adequate volume Performed at [facility], [address] [place], [place] [address]    Culture   Final    NO GROWTH < 24 HOURS Performed at [facility], [address] [place],  [address]    Report Status PENDING  Incomplete     Radiology Studies: Ct Angio Low Extrem Right W &/or Wo Contrast  Result Date: 06/29/2019 CLINICAL DATA:  38 year old male with painful and cold lower extremity. History of heroin use. Pain in the lateral aspect of the proximal thigh. No trauma. EXAM: CT ANGIOGRAPHY OF THE right lowerEXTREMITY TECHNIQUE: Multidetector CT imaging of the right lowerwas performed using the  standard protocol during bolus administration of intravenous contrast. Multiplanar CT image reconstructions and MIPs were obtained to evaluate the vascular anatomy. CONTRASTSsm Health St Marys Janesville HospitTheKorea SBarbara CMarland KitchenoFlorham Pa77[facility] BedfoTheK[place] SBarbara CMarland KitchenoSt52[facility] LebanTheKorea SBarbara CMarland KitchenoOcean85W[facility] Woods Geriatric HospitTheK[place] SBarbara CMarland KitchenoMease31[facility] HospitTheK[place] SBarbara CMarland KitchenoPhs [facility] At77[facility] LTheKorea SBarbara CMarland KitchenoMease52[facility] CampTheK[place] SBarbara CMarland KitchenoInstituto De Ga86[facility] JohnstoThe[place] SBarbara CMarland KitchenoCommunity Hospi5[facility] ITheK[place] SBarbara CMarland KitchenoSurgicare Surgical Assoc36m<MEASUTheKorea SBarbara CMarland KiCleveland Asc LLC Dba Cleveland Surgical SuitTheKorea SBarbara CMarland KitchenoIrwin Ar63[facility] LTheKorea SBarbara CMarland KitchenoSan Antonio Gastroenterolo84[facility] Endoscopy CentTheKorea SBarbara CMarland KitchenoNix Beh47[facility] Rehabilitation HospitTheKorea SBarbara CMarland KitchenoBethesda Chevy Chase Surgery Center LLC Dba Bethesda Chevy26Claxton-Hepburn Medical CentTheKorea SBarbara CMarland KitchenoEncompass Health Rehabilitatio58[facility] - BuffaTheK[place] SBarbara CMarland KitchenoMt Ogden Uta42mh LuMeDoug Souwnter [email or web address]:  None. FINDINGS: The visualized external iliac artery, common, deep, and superficial femoral arteries, popliteal artery and its trifurcation, and arteries of the calf are patent to the level of the ankle bilaterally. There is opacification of the dorsalis pedis arteries as well as plantar arteries bilaterally. Evaluation of the veins is limited on this arterial phase  scan. There is heterogeneous enhancement and inflammatory changes of the musculature of the proximal anterior right thigh involving the vastus lateralis, vastus medialis and intermedius, and the rectus femoris muscles suggestive of myositis. Clinical correlation is recommended. There is stranding of the deep fascial planes of the anterior compartment of the proximal right thigh. No drainable fluid collection identified. No soft tissue gas. There is a small suprapatellar effusion on the right. There is no acute fracture or dislocation. The bones are well mineralized. No arthritic changes. No bone erosions or evidence of acute osteomyelitis by CT. Review of the MIP images confirms the above findings. IMPRESSION: 1. No evidence of vascular occlusion or high-grade stenosis. 2. Findings most consistent with myositis of the anterior compartment of the right thigh. No drainable fluid collection/abscess. No soft tissue gas. Electronically Signed   By: Anner Crete M.D.   On: 06/29/2019 21:58   Vas [place] Lower Extremity Venous (dvt) (only Mc & Wl)  Result Date: 06/30/2019  Lower Venous Study Indications: Pain, and Swelling in the anterior thigh and groin area.  Risk Factors: Substance abuse. Patient states he has been using heroin to help with the pain. Limitations: Patient could not tolerate compression in the thigh and groin areas  secondary to extreme pain. Patient continuously holding breath during exam, secondary to pain. Comparison Study: No prior study on file for comparison Performing Technologist: Sharion Dove RVS  Examination Guidelines: A complete evaluation includes B-mode imaging, spectral Doppler, color Doppler, and power Doppler as needed of all accessible portions of each vessel. Bilateral testing is considered an integral part of a complete examination. Limited examinations for reoccurring indications may be performed as noted.  +---------+---------------+---------+-----------+----------+--------------+  RIGHT     Compressibility Phasicity Spontaneity Properties Summary         +---------+---------------+---------+-----------+----------+--------------+  CFV                       Yes       Yes                                    +---------+---------------+---------+-----------+----------+--------------+  FV Prox   Full            Yes       Yes                                    +---------+---------------+---------+-----------+----------+--------------+  FV Mid                    Yes       Yes                                    +---------+---------------+---------+-----------+----------+--------------+  FV Distal                 Yes       Yes                                    +---------+---------------+---------+-----------+----------+--------------+  PFV  Not visualized  +---------+---------------+---------+-----------+----------+--------------+  POP       Full            Yes       Yes                                    +---------+---------------+---------+-----------+----------+--------------+  PTV       Full                                                             +---------+---------------+---------+-----------+----------+--------------+  PERO      Full                                                              +---------+---------------+---------+-----------+----------+--------------+   +----+---------------+---------+-----------+----------+-------+  LEFT Compressibility Phasicity Spontaneity Properties Summary  +----+---------------+---------+-----------+----------+-------+  CFV                  Yes       Yes                             +----+---------------+---------+-----------+----------+-------+     Summary: Right: There is no evidence of deep vein thrombosis in the lower extremity. However, portions of this examination were limited- see technologist comments above. Tissue in the thigh appears disturbed, etiology unknown. Left: No evidence of common femoral vein obstruction. Ultrasound characteristics of enlarged lymph nodes noted in the groin.  *See table(s) above for measurements and observations. Electronically signed by Lemar Livings MD on 06/30/2019 at 4:12:22 PM.    Final     Scheduled Meds:  enoxaparin (LOVENOX) injection  40 mg Subcutaneous Q24H   Continuous Infusions:  sodium chloride 150 mL/hr at 07/01/19 1103   vancomycin 1,500 mg (07/01/19 0909)     LOS: 1 day   Rickey Barbara, MD [facility] Pager On Amion  If 7PM-7AM, please contact night-coverage 07/01/2019, 4:19 PM

## 2019-07-01 NOTE — Progress Notes (Signed)
Updated pt's mother on pt condition and answered all questions.

## 2019-07-01 NOTE — Evaluation (Signed)
Physical Therapy Evaluation Patient Details Name: Terrence Henry MRN: [id number] DOB: May 17, 1981 Today's Date: 07/01/2019   History of Present Illness  38 yo male admitted to ED on 7/19 with R lateral proximal thigh pain, heroin ingestion. Pt with myositis of the anterior compartment of R thigh. PMH includes substance abuse with OD.  Clinical Impression  Pt presents with severe R thigh pain, decreased RLE strength due to pain, increased time and effort to perform mobility tasks, antalgic gait, and decreased activity tolerance. Pt to benefit from acute PT to address deficits. Pt ambulated hallway distance with and without IV pole for steadying, pt with increasingly antalgic gait with further ambulation distance. PT recommends to pt hallway ambulation with nurse tech multiple times a day and RLE AROM (hip and knee flexion and extension) to maintain ROM and LE strength. No follow up PT recommended at this time. PT to progress mobility as tolerated, and will continue to follow acutely.      Follow Up Recommendations No PT follow up    Equipment Recommendations  None recommended by PT    Recommendations for Other Services       Precautions / Restrictions Precautions Precautions: Fall Restrictions Weight Bearing Restrictions: No      Mobility  Bed Mobility Overal bed mobility: Needs Assistance Bed Mobility: Supine to Sit     Supine to sit: Supervision     General bed mobility comments: supervision for safety, increased time  Transfers Overall transfer level: Needs assistance Equipment used: None Transfers: Sit to/from Stand Sit to Stand: Min guard         General transfer comment: Min guard for safety, increased time to rise and reaching for IV pole to steady self.  Ambulation/Gait Ambulation/Gait assistance: Supervision Gait Distance (Feet): 125 Feet Assistive device: IV Pole;None Gait Pattern/deviations: Step-through pattern;Antalgic;Decreased weight shift to  right;Decreased stance time - right;Decreased stride length;Trunk flexed Gait velocity: decr   General Gait Details: Supervision for safety. Pt using IV pole initially for steadying, able to ambulate without AD for second half of ambulation. Pt limited by antalgic gait.  Stairs            Wheelchair Mobility    Modified Rankin (Stroke Patients Only)       Balance Overall balance assessment: Modified Independent                                           Pertinent Vitals/Pain Pain Assessment: 0-10 Pain Score: 7  Pain Location: R thigh, with mobility Pain Descriptors / Indicators: Sore Pain Intervention(s): Limited activity within patient's tolerance;Monitored during session;Premedicated before session;Repositioned    Home Living Family/patient expects to be discharged to:: Private residence Living Arrangements: Spouse/significant other Available Help at Discharge: Family;Available 24 hours/day Type of Home: Other(Comment)(living in hotel) Home Access: Elevator     Home Layout: One level Home Equipment: Shower seat - built in      Prior Function Level of Independence: Independent         Comments: Pt reports doing everything for self PTA, his R thigh has been hurting since 7/17 and since then he has had difficulty ambulating     Hand Dominance   Dominant Hand: Right    Extremity/Trunk Assessment   Upper Extremity Assessment Upper Extremity Assessment: Overall WFL for tasks assessed    Lower Extremity Assessment Lower Extremity Assessment: Overall WFL for tasks assessed;RLE  deficits/detail RLE Deficits / Details: able to perform full AROM hip and knee flexion/extension, DF/PF with increased time, MMT not tested due to severe pain RLE: Unable to fully assess due to pain    Cervical / Trunk Assessment Cervical / Trunk Assessment: Normal  Communication   Communication: No difficulties  Cognition Arousal/Alertness:  Awake/alert Behavior During Therapy: WFL for tasks assessed/performed;Flat affect Overall Cognitive Status: Within Functional Limits for tasks assessed                                        General Comments      Exercises     Assessment/Plan    PT Assessment Patient needs continued PT services  PT Problem List Decreased mobility;Decreased range of motion;Decreased activity tolerance;Decreased balance;Pain       PT Treatment Interventions Gait training;Functional mobility training;Patient/family education;Therapeutic exercise    PT Goals (Current goals can be found in the Care Plan section)  Acute Rehab PT Goals Patient Stated Goal: decrease thigh pain PT Goal Formulation: With patient Time For Goal Achievement: 07/15/19 Potential to Achieve Goals: Good    Frequency Min 3X/week   Barriers to discharge        Co-evaluation               AM-PAC PT "6 Clicks" Mobility  Outcome Measure Help needed turning from your back to your side while in a flat bed without using bedrails?: None Help needed moving from lying on your back to sitting on the side of a flat bed without using bedrails?: None Help needed moving to and from a bed to a chair (including a wheelchair)?: None Help needed standing up from a chair using your arms (e.g., wheelchair or bedside chair)?: A Little Help needed to walk in hospital room?: A Little Help needed climbing 3-5 steps with a railing? : A Little 6 Click Score: 21    End of Session   Activity Tolerance: Patient limited by pain Patient left: in chair;with call bell/phone within reach Nurse Communication: Mobility status PT Visit Diagnosis: Pain;Difficulty in walking, not elsewhere classified (R26.2) Pain - Right/Left: Right Pain - part of body: Leg    Time: 1214-1226 PT Time Calculation (min) (ACUTE ONLY): 12 min   Charges:   PT Evaluation $PT Eval Low Complexity: 1 Low         Nicola PoliceAlexa D Wilgus Deyton, PT Acute  Rehabilitation Services Pager [phone]  Office [phone]  Grantley Savage D Lyndzee Kliebert 07/01/2019, 1:02 PM

## 2019-07-02 LAB — COMPREHENSIVE METABOLIC PANEL
ALT: 28 U/L (ref 0–44)
AST: 19 U/L (ref 15–41)
Albumin: 2.8 g/dL — ABNORMAL LOW (ref 3.5–5.0)
Alkaline Phosphatase: 50 U/L (ref 38–126)
Anion gap: 7 (ref 5–15)
BUN: 10 mg/dL (ref 6–20)
CO2: 22 mmol/L (ref 22–32)
Calcium: 8.3 mg/dL — ABNORMAL LOW (ref 8.9–10.3)
Chloride: 113 mmol/L — ABNORMAL HIGH (ref 98–111)
Creatinine, Ser: 0.68 mg/dL (ref 0.61–1.24)
GFR calc Af Amer: >60 mL/min (ref >60)
GFR calc non Af Amer: >60 mL/min (ref >60)
Glucose, Bld: 175 mg/dL — ABNORMAL HIGH (ref 70–99)
Potassium: 3.4 mmol/L — ABNORMAL LOW (ref 3.5–5.1)
Sodium: 142 mmol/L (ref 135–145)
Total Bilirubin: 0.3 mg/dL (ref 0.3–1.2)
Total Protein: 6 g/dL — ABNORMAL LOW (ref 6.5–8.1)

## 2019-07-02 LAB — CBC WITH DIFFERENTIAL/PLATELET
Abs Immature Granulocytes: 0.14 10*3/uL — ABNORMAL HIGH (ref 0.00–0.07)
Basophils Absolute: 0 10*3/uL (ref 0.0–0.1)
Basophils Relative: 0 %
Eosinophils Absolute: 0.1 10*3/uL (ref 0.0–0.5)
Eosinophils Relative: 1 %
HCT: 36.9 % — ABNORMAL LOW (ref 39.0–52.0)
Hemoglobin: 11.7 g/dL — ABNORMAL LOW (ref 13.0–17.0)
Immature Granulocytes: 1 %
Lymphocytes Relative: 15 %
Lymphs Abs: 1.6 10*3/uL (ref 0.7–4.0)
MCH: 29.6 pg (ref 26.0–34.0)
MCHC: 31.7 g/dL (ref 30.0–36.0)
MCV: 93.4 fL (ref 80.0–100.0)
Monocytes Absolute: 0.7 10*3/uL (ref 0.1–1.0)
Monocytes Relative: 6 %
Neutro Abs: 8.1 10*3/uL — ABNORMAL HIGH (ref 1.7–7.7)
Neutrophils Relative %: 77 %
Platelets: 166 10*3/uL (ref 150–400)
RBC: 3.95 MIL/uL — ABNORMAL LOW (ref 4.22–5.81)
RDW: 12.2 % (ref 11.5–15.5)
WBC: 10.6 10*3/uL — ABNORMAL HIGH (ref 4.0–10.5)
nRBC: 0 % (ref 0.0–0.2)

## 2019-07-02 LAB — HEPATITIS PANEL, ACUTE
HCV Ab: <0.1 s/co ratio (ref 0.0–0.9)
Hep A IgM: NEGATIVE
Hep B C IgM: NEGATIVE
Hepatitis B Surface Ag: NEGATIVE

## 2019-07-02 LAB — CK: Total CK: 80 U/L (ref 49–397)

## 2019-07-02 LAB — VANCOMYCIN, TROUGH: Vancomycin Tr: 10 ug/mL — ABNORMAL LOW (ref 15–20)

## 2019-07-02 MED ORDER — CLINDAMYCIN HCL 300 MG PO CAPS: 300.0000 mg | ORAL_CAPSULE | Freq: Three times a day (TID) | ORAL | 0 refills | Status: AC

## 2019-07-02 NOTE — Progress Notes (Signed)
Pharmacy Antibiotic Note  Terrence Henry is a 38 y.o. male admitted on 06/29/2019 with myositis.  Pharmacy has been consulted for Vancomycin dosing.  Today, 07/02/2019:  WBC remain elevated but improving  Afebrile since admit  SCr stable WNL  VT borderline low but suspect true VT higher as overnight dose given about 45 min early  Noticeable improvement in redness per RN  BCx ngtd  Plan:  Continue vanc 1500 mg IV q8 hr (AUC 473 with SCr 0.8; Vd 0.72) - borderline low VT but no need to adjust as true VT slightly higher and patient looks clinically improved     Height: 6\' 3"  (190.5 cm) Weight: 224 lb 3.3 oz (101.7 kg) IBW/kg (Calculated) : 84.5  Temp (24hrs), Avg:98.6 F (37 C), Min:98.4 F (36.9 C), Max:98.7 F (37.1 C)  Recent Labs  Lab 06/29/19 2020 06/30/19 0301 07/01/19 0941 07/01/19 1848 07/02/19 1002  WBC 16.2* 15.5* 14.6*  --  10.6*  CREATININE 0.79 0.69 0.58*  --  0.68  LATICACIDVEN 1.0  --   --   --   --   VANCOTROUGH  --   --   --  8* 10*    Estimated Creatinine Clearance: 163.4 mL/min (by C-G formula based on SCr of 0.68 mg/dL).    No Known Allergies   Thank you for allowing pharmacy to be a part of this patient's care.  Reuel Boom, PharmD, BCPS [phone] 07/02/2019, 11:27 AM

## 2019-07-02 NOTE — Progress Notes (Signed)
Discharge instructions given to patient. Patient had no questions. Bus pass also given to patient. Writer wheeled patient to the front of the building

## 2019-07-02 NOTE — TOC Transition Note (Signed)
Transition of Care [facility]) - CM/SW Discharge Note   Patient Details  Name: Terrence Henry MRN: [id number] Date of Birth: 1981/11/19  Transition of Care [facility]) CM/SW Contact:  Dessa Phi, RN Phone Number: 07/02/2019, 3:17 PM   Clinical Narrative: d/c home w/otpt substance abuse resource list given-patient voiced understanding. also provided w/bus pass-patient indep w/ambulation.  No further CM needs.    Final next level of care: OP Rehab Barriers to Discharge: No Barriers Identified   Patient Goals and CMS Choice Patient states their goals for this hospitalization and ongoing recovery are:: go home      Discharge Placement                       Discharge Plan and Services   Discharge Planning Services: CM Consult, Other - See comment(otpt substance abuse resource list)                                 Social Determinants of Health (SDOH) Interventions     Readmission Risk Interventions No flowsheet data found.

## 2019-07-02 NOTE — Discharge Summary (Signed)
Physician Discharge Summary  Tye MarylandCameron W Scruggs WUJ:[id number] DOB: 1981-10-10 DOA: 06/29/2019  PCP: System, Pcp Not In  Admit date: 06/29/2019 Discharge date: 07/02/2019  Admitted From: Inpatient Disposition: home  Recommendations for Outpatient Follow-up:  1. Follow up with PCP in 1-2 weeks   Home Health:No Equipment/Devices:none  Discharge Condition:Stable CODE STATUS:Full code Diet recommendation: Regular healthy diet  Brief/Interim Summary: 38 yr old man who states that he has no know medical issues. He is an abuser of multiple drugs Methamphetamines, Heroine, MDMA, and Cocaine. He denies IV drug use, however.   The patient states that the current illness started 2 days ago with right leg pain. He states that it has gotten worse over the past two days. He states that now it is hard for him to walk. He denies that he used any needles in the leg.   He denies fevers, chills, cough, shortness of breath, vomiting, diarrhea, or constipation. He states that he did have some nausea in the last 2 days.   In the ED his vitals were within normal limits.  Myositis.  Unclear etiology although it was suspected drug abuse from multiple substances noted on his UDS.  CK was obtained which is returned to near normal, renal function is stable, pain is improved considerably, CT did not identify any acute infectious process, white blood cell count is normal, blood pressure is stable and patient has been afebrile.  Patient was on vancomycin.  He will be discharged on clindamycin, prescription provided electronically. Rhabdomyolysis.  As noted CK was elevated continue to trend down renal function was preserved. Polysubstance abuse.  Patient instruction was possibly cocaine heroine MDMA and methamphetamines.  He denied IV drug use reported he snorted these drugs purchased off the street.  Patient received abstinence counseling there was no evidence of withdrawals.  Patient is stable.  He declines referral  to substance abuse counseling programs  Discharge Diagnoses:  Principal Problem:   Myositis of right thigh Active Problems:   TOBACCO ABUSE   History of substance abuse (HCC)   Rhabdomyolysis    Discharge Instructions  Discharge Instructions    Call MD for:   Complete by: As directed    Any acute change in medical condition   Call MD for:  hives   Complete by: As directed    Call MD for:  redness, tenderness, or signs of infection (pain, swelling, redness, odor or green/yellow discharge around incision site)   Complete by: As directed    Call MD for:  severe uncontrolled pain   Complete by: As directed    Call MD for:  temperature >100.4   Complete by: As directed    Diet - low sodium heart healthy   Complete by: As directed    Increase activity slowly   Complete by: As directed      Allergies as of 07/02/2019   No Known Allergies     Medication List    TAKE these medications   clindamycin 300 MG capsule Commonly known as: CLEOCIN Take 1 capsule (300 mg total) by mouth 3 (three) times daily for 10 days.   fluconazole 150 MG tablet Commonly known as: DIFLUCAN Take 2 tablets today. Repeat in 1 week.       No Known Allergies  Consultations:  None   Procedures/Studies: Ct Angio Low Extrem Right W &/or Wo Contrast  Result Date: 06/29/2019 CLINICAL DATA:  38 year old male with painful and cold lower extremity. History of heroin use. Pain in the lateral aspect of  the proximal thigh. No trauma. EXAM: CT ANGIOGRAPHY OF THE right lowerEXTREMITY TECHNIQUE: Multidetector CT imaging of the right lowerwas performed using the standard protocol during bolus administration of intravenous contrast. Multiplanar CT image reconstructions and MIPs were obtained to evaluate the vascular anatomy. CONTRAST:  120mL OMNIPAQUE IOHEXOL 350 MG/ML SOLN COMPARISON:  None. FINDINGS: The visualized external iliac artery, common, deep, and superficial femoral arteries, popliteal artery and  its trifurcation, and arteries of the calf are patent to the level of the ankle bilaterally. There is opacification of the dorsalis pedis arteries as well as plantar arteries bilaterally. Evaluation of the veins is limited on this arterial phase scan. There is heterogeneous enhancement and inflammatory changes of the musculature of the proximal anterior right thigh involving the vastus lateralis, vastus medialis and intermedius, and the rectus femoris muscles suggestive of myositis. Clinical correlation is recommended. There is stranding of the deep fascial planes of the anterior compartment of the proximal right thigh. No drainable fluid collection identified. No soft tissue gas. There is a small suprapatellar effusion on the right. There is no acute fracture or dislocation. The bones are well mineralized. No arthritic changes. No bone erosions or evidence of acute osteomyelitis by CT. Review of the MIP images confirms the above findings. IMPRESSION: 1. No evidence of vascular occlusion or high-grade stenosis. 2. Findings most consistent with myositis of the anterior compartment of the right thigh. No drainable fluid collection/abscess. No soft tissue gas. Electronically Signed   By: Anner Crete M.D.   On: 06/29/2019 21:58   Vas [place] Lower Extremity Venous (dvt) (only [facility])  Result Date: 06/30/2019  Lower Venous Study Indications: Pain, and Swelling in the anterior thigh and groin area.  Risk Factors: Substance abuse. Patient states he has been using heroin to help with the pain. Limitations: Patient could not tolerate compression in the thigh and groin areas secondary to extreme pain. Patient continuously holding breath during exam, secondary to pain. Comparison Study: No prior study on file for comparison Performing Technologist: Sharion Dove RVS  Examination Guidelines: A complete evaluation includes B-mode imaging, spectral Doppler, color Doppler, and power Doppler as needed of all accessible  portions of each vessel. Bilateral testing is considered an integral part of a complete examination. Limited examinations for reoccurring indications may be performed as noted.  +---------+---------------+---------+-----------+----------+--------------+ RIGHT    CompressibilityPhasicitySpontaneityPropertiesSummary        +---------+---------------+---------+-----------+----------+--------------+ CFV                     Yes      Yes                                 +---------+---------------+---------+-----------+----------+--------------+ FV Prox  Full           Yes      Yes                                 +---------+---------------+---------+-----------+----------+--------------+ FV Mid                  Yes      Yes                                 +---------+---------------+---------+-----------+----------+--------------+ FV Distal  Yes      Yes                                 +---------+---------------+---------+-----------+----------+--------------+ PFV                                                   Not visualized +---------+---------------+---------+-----------+----------+--------------+ POP      Full           Yes      Yes                                 +---------+---------------+---------+-----------+----------+--------------+ PTV      Full                                                        +---------+---------------+---------+-----------+----------+--------------+ PERO     Full                                                        +---------+---------------+---------+-----------+----------+--------------+   +----+---------------+---------+-----------+----------+-------+ LEFTCompressibilityPhasicitySpontaneityPropertiesSummary +----+---------------+---------+-----------+----------+-------+ CFV                Yes      Yes                          +----+---------------+---------+-----------+----------+-------+      Summary: Right: There is no evidence of deep vein thrombosis in the lower extremity. However, portions of this examination were limited- see technologist comments above. Tissue in the thigh appears disturbed, etiology unknown. Left: No evidence of common femoral vein obstruction. Ultrasound characteristics of enlarged lymph nodes noted in the groin.  *See table(s) above for measurements and observations. Electronically signed by Lemar LivingsBrandon Cain MD on 06/30/2019 at 4:12:22 PM.    Final        Subjective: Patient doing well resting in bed comfortably labs have improved dramatically Minimal pain  Discharge Exam: Vitals:   07/01/19 2153 07/02/19 0613  BP: (!) 142/76 132/74  Pulse: 71 66  Resp: 16 14  Temp: 98.7 F (37.1 C) 98.4 F (36.9 C)  SpO2: 99% 100%   Vitals:   07/01/19 0500 07/01/19 1332 07/01/19 2153 07/02/19 0613  BP: 114/68 134/71 (!) 142/76 132/74  Pulse: 81 84 71 66  Resp: 18 18 16 14   Temp: 97.9 F (36.6 C) 98.7 F (37.1 C) 98.7 F (37.1 C) 98.4 F (36.9 C)  TempSrc: Oral Oral Oral Oral  SpO2: 99% 100% 99% 100%  Weight:      Height:        General: Pt is alert, awake, not in acute distress Cardiovascular: RRR, S1/S2 +, no rubs, no gallops Respiratory: CTA bilaterally, no wheezing, no rhonchi Abdominal: Soft, NT, ND, bowel sounds + Extremities: no edema, no cyanosis. No evidence of acute or draining infections    The results of significant diagnostics from this hospitalization (including imaging,  microbiology, ancillary and laboratory) are listed below for reference.     Microbiology: Recent Results (from the past 240 hour(s))  SARS Coronavirus 2 (CEPHEID - Performed in [facility] hospital lab), Hosp Order     Status: None   Collection Time: 06/29/19 11:10 PM   Specimen: Nasopharyngeal Swab  Result Value Ref Range Status   SARS Coronavirus 2 NEGATIVE NEGATIVE Final    Comment: (NOTE) If result is NEGATIVE SARS-CoV-2 target nucleic acids are NOT  DETECTED. The SARS-CoV-2 RNA is generally detectable in upper and lower  respiratory specimens during the acute phase of infection. The lowest  concentration of SARS-CoV-2 viral copies this assay can detect is 250  copies / mL. A negative result does not preclude SARS-CoV-2 infection  and should not be used as the sole basis for treatment or other  patient management decisions.  A negative result may occur with  improper specimen collection / handling, submission of specimen other  than nasopharyngeal swab, presence of viral mutation(s) within the  areas targeted by this assay, and inadequate number of viral copies  (<250 copies / mL). A negative result must be combined with clinical  observations, patient history, and epidemiological information. If result is POSITIVE SARS-CoV-2 target nucleic acids are DETECTED. The SARS-CoV-2 RNA is generally detectable in upper and lower  respiratory specimens dur ing the acute phase of infection.  Positive  results are indicative of active infection with SARS-CoV-2.  Clinical  correlation with patient history and other diagnostic information is  necessary to determine patient infection status.  Positive results do  not rule out bacterial infection or co-infection with other viruses. If result is PRESUMPTIVE POSTIVE SARS-CoV-2 nucleic acids MAY BE PRESENT.   A presumptive positive result was obtained on the submitted specimen  and confirmed on repeat testing.  While 2019 novel coronavirus  (SARS-CoV-2) nucleic acids may be present in the submitted sample  additional confirmatory testing may be necessary for epidemiological  and / or clinical management purposes  to differentiate between  SARS-CoV-2 and other Sarbecovirus currently known to infect humans.  If clinically indicated additional testing with an alternate test  methodology [phone]) is advised. The SARS-CoV-2 RNA is generally  detectable in upper and lower respiratory sp ecimens during  the acute  phase of infection. The expected result is Negative. Fact Sheet for Patients:  BoilerBrush.com.cy Fact Sheet for Healthcare Providers: https://pope.com/ This test is not yet approved or cleared by the [place] FDA and has been authorized for detection and/or diagnosis of SARS-CoV-2 by FDA under an Emergency Use Authorization (EUA).  This EUA will remain in effect (meaning this test can be used) for the duration of the COVID-19 declaration under Section 564(b)(1) of the Act, 21 U.S.C. section 360bbb-3(b)(1), unless the authorization is terminated or revoked sooner. Performed at [facility], [address], [place], [place] [address]   Culture, blood (routine x 2)     Status: None (Preliminary result)   Collection Time: 06/30/19 10:43 AM   Specimen: BLOOD  Result Value Ref Range Status   Specimen Description   Final    BLOOD RIGHT ANTECUBITAL Performed at [facility], [address], [place], [place] [address]    Special Requests   Final    BOTTLES DRAWN AEROBIC AND ANAEROBIC Blood Culture adequate volume Performed at [facility], [address], [place], [place] [address]    Culture   Final    NO GROWTH 2 DAYS Performed at [facility], [address] [place], [place] [address]    Report Status PENDING  Incomplete  Culture, blood (routine x 2)     Status: None (Preliminary result)   Collection Time: 06/30/19 10:46 AM   Specimen: BLOOD RIGHT HAND  Result Value Ref Range Status   Specimen Description   Final    BLOOD RIGHT HAND Performed at [facility], [address] [place], [place] [address]    Special Requests   Final    BOTTLES DRAWN AEROBIC AND ANAEROBIC Blood Culture adequate volume Performed at [facility], [address] [place], [place] [address]    Culture   Final    NO GROWTH 2 DAYS Performed at [facility], [address] [place], [place] [address]    Report Status PENDING  Incomplete     Labs: BNP (last 3 results) No results for input(s): BNP in the last 8760 hours. Basic Metabolic Panel: Recent Labs  Lab 06/29/19 2020 06/30/19 0301 07/01/19 0941 07/02/19 1002  NA 136 139 141 142  K 4.2 4.2 3.7 3.4*  CL 102 109 113* 113*  CO2 28 23 20* 22  GLUCOSE 127* 151* 90 175*  BUN 14 10 12 10   CREATININE 0.79 0.69 0.58* 0.68  CALCIUM 9.1 8.5* 8.1* 8.3*   Liver Function Tests: Recent Labs  Lab 06/30/19 0301 07/02/19 1002  AST 23 19  ALT 31 28  ALKPHOS 55 50  BILITOT 0.6 0.3  PROT 6.2* 6.0*  ALBUMIN 3.3* 2.8*   No results for input(s): LIPASE, AMYLASE in the last 168 hours. No results for input(s): AMMONIA in the last 168 hours. CBC: Recent Labs  Lab 06/29/19 2020 06/30/19 0301 07/01/19 0941 07/02/19 1002  WBC 16.2* 15.5* 14.6* 10.6*  NEUTROABS 12.4*  --   --  8.1*  HGB 14.0 13.5 11.6* 11.7*  HCT 43.1 42.7 36.4* 36.9*  MCV 92.1 93.6 94.3 93.4  PLT 145* 154 146* 166   Cardiac Enzymes: Recent Labs  Lab 06/29/19 2020 06/30/19 0301 07/01/19 0941 07/02/19 1002  CKTOTAL 657* 440* 114 80   BNP: Invalid input(s): POCBNP CBG: No results for input(s): GLUCAP in the last 168 hours. D-Dimer No results for input(s): DDIMER in the last 72 hours. Hgb A1c No results for input(s): HGBA1C in the last 72 hours. Lipid Profile No results for input(s): CHOL, HDL, LDLCALC, TRIG, CHOLHDL, LDLDIRECT in the last 72 hours. Thyroid function studies No results for input(s): TSH, T4TOTAL, T3FREE, THYROIDAB in the last 72 hours.  Invalid input(s): FREET3 Anemia work up No results for input(s): VITAMINB12, FOLATE, FERRITIN, TIBC, IRON, RETICCTPCT in the last 72 hours. Urinalysis    Component Value Date/Time   COLORURINE YELLOW 10/22/2018 1041   APPEARANCEUR CLEAR 10/22/2018 1041   LABSPEC 1.018 10/22/2018 1041   PHURINE 5.0 10/22/2018 1041   GLUCOSEU NEGATIVE  10/22/2018 1041   HGBUR NEGATIVE 10/22/2018 1041   BILIRUBINUR NEGATIVE 10/22/2018 1041   KETONESUR NEGATIVE 10/22/2018 1041   PROTEINUR NEGATIVE 10/22/2018 1041   NITRITE NEGATIVE 10/22/2018 1041   LEUKOCYTESUR NEGATIVE 10/22/2018 1041   Sepsis Labs Invalid input(s): PROCALCITONIN,  WBC,  LACTICIDVEN Microbiology Recent Results (from the past 240 hour(s))  SARS Coronavirus 2 (CEPHEID - Performed in [facility] hospital lab), Hosp Order     Status: None   Collection Time: 06/29/19 11:10 PM   Specimen: Nasopharyngeal Swab  Result Value Ref Range Status   SARS Coronavirus 2 NEGATIVE NEGATIVE Final  Comment: (NOTE) If result is NEGATIVE SARS-CoV-2 target nucleic acids are NOT DETECTED. The SARS-CoV-2 RNA is generally detectable in upper and lower  respiratory specimens during the acute phase of infection. The lowest  concentration of SARS-CoV-2 viral copies this assay can detect is 250  copies / mL. A negative result does not preclude SARS-CoV-2 infection  and should not be used as the sole basis for treatment or other  patient management decisions.  A negative result may occur with  improper specimen collection / handling, submission of specimen other  than nasopharyngeal swab, presence of viral mutation(s) within the  areas targeted by this assay, and inadequate number of viral copies  (<250 copies / mL). A negative result must be combined with clinical  observations, patient history, and epidemiological information. If result is POSITIVE SARS-CoV-2 target nucleic acids are DETECTED. The SARS-CoV-2 RNA is generally detectable in upper and lower  respiratory specimens dur ing the acute phase of infection.  Positive  results are indicative of active infection with SARS-CoV-2.  Clinical  correlation with patient history and other diagnostic information is  necessary to determine patient infection status.  Positive results do  not rule out bacterial infection or co-infection with  other viruses. If result is PRESUMPTIVE POSTIVE SARS-CoV-2 nucleic acids MAY BE PRESENT.   A presumptive positive result was obtained on the submitted specimen  and confirmed on repeat testing.  While 2019 novel coronavirus  (SARS-CoV-2) nucleic acids may be present in the submitted sample  additional confirmatory testing may be necessary for epidemiological  and / or clinical management purposes  to differentiate between  SARS-CoV-2 and other Sarbecovirus currently known to infect humans.  If clinically indicated additional testing with an alternate test  methodology [phone]) is advised. The SARS-CoV-2 RNA is generally  detectable in upper and lower respiratory sp ecimens during the acute  phase of infection. The expected result is Negative. Fact Sheet for Patients:  BoilerBrush.com.cy Fact Sheet for Healthcare Providers: https://pope.com/ This test is not yet approved or cleared by the [place] FDA and has been authorized for detection and/or diagnosis of SARS-CoV-2 by FDA under an Emergency Use Authorization (EUA).  This EUA will remain in effect (meaning this test can be used) for the duration of the COVID-19 declaration under Section 564(b)(1) of the Act, 21 U.S.C. section 360bbb-3(b)(1), unless the authorization is terminated or revoked sooner. Performed at [facility], [address] [place], [place] [address]   Culture, blood (routine x 2)     Status: None (Preliminary result)   Collection Time: 06/30/19 10:43 AM   Specimen: BLOOD  Result Value Ref Range Status   Specimen Description   Final    BLOOD RIGHT ANTECUBITAL Performed at [facility], [address], [place], [place] [address]    Special Requests   Final    BOTTLES DRAWN AEROBIC AND ANAEROBIC Blood Culture adequate volume Performed at [facility], [address], [place], [place] [address]    Culture   Final     NO GROWTH 2 DAYS Performed at [facility], [address], [place], [place] [address]    Report Status PENDING  Incomplete  Culture, blood (routine x 2)     Status: None (Preliminary result)   Collection Time: 06/30/19 10:46 AM   Specimen: BLOOD RIGHT HAND  Result Value Ref Range Status   Specimen Description   Final    BLOOD RIGHT HAND Performed at [facility],  [address], [place], [place] [address]    Special Requests   Final    BOTTLES DRAWN AEROBIC AND ANAEROBIC Blood Culture adequate volume Performed at [facility], [address] [place], [place] [address]    Culture   Final    NO GROWTH 2 DAYS Performed at [facility], [address] [place], [place] [address]    Report Status PENDING  Incomplete     Time coordinating discharge: Over 30 minutes  SIGNED:   Burke Keels, MD  [facility] 07/02/2019, 12:30 PM Pager   If 7PM-7AM, please contact night-coverage [email or web address] Password [email or web address]

## 2019-07-05 LAB — CULTURE, BLOOD (ROUTINE X 2)
Culture: NO GROWTH
Culture: NO GROWTH
Special Requests: ADEQUATE
Special Requests: ADEQUATE

## 2020-03-11 ENCOUNTER — Emergency Department (HOSPITAL_COMMUNITY)
Admission: EM | Admit: 2020-03-11 | Discharge: 2020-03-11 | Disposition: A | Discharge status: Home / Self Care | Payer: Self-pay | Attending: Emergency Medicine | Admitting: Emergency Medicine

## 2020-03-11 ENCOUNTER — Emergency Department (HOSPITAL_COMMUNITY): Payer: Self-pay

## 2020-03-11 ENCOUNTER — Encounter (HOSPITAL_COMMUNITY): Payer: Self-pay | Admitting: Emergency Medicine

## 2020-03-11 ENCOUNTER — Telehealth: Payer: Self-pay | Admitting: Internal Medicine

## 2020-03-11 DIAGNOSIS — F1510 Other stimulant abuse, uncomplicated: Secondary | ICD-10-CM | POA: Insufficient documentation

## 2020-03-11 DIAGNOSIS — F1590 Other stimulant use, unspecified, uncomplicated: Secondary | ICD-10-CM

## 2020-03-11 DIAGNOSIS — B179 Acute viral hepatitis, unspecified: Secondary | ICD-10-CM | POA: Insufficient documentation

## 2020-03-11 DIAGNOSIS — Z79899 Other long term (current) drug therapy: Secondary | ICD-10-CM | POA: Insufficient documentation

## 2020-03-11 DIAGNOSIS — B159 Hepatitis A without hepatic coma: Secondary | ICD-10-CM

## 2020-03-11 DIAGNOSIS — R7989 Other specified abnormal findings of blood chemistry: Secondary | ICD-10-CM

## 2020-03-11 DIAGNOSIS — F17210 Nicotine dependence, cigarettes, uncomplicated: Secondary | ICD-10-CM | POA: Insufficient documentation

## 2020-03-11 DIAGNOSIS — R945 Abnormal results of liver function studies: Secondary | ICD-10-CM | POA: Insufficient documentation

## 2020-03-11 HISTORY — DX: Hepatitis a without hepatic coma: B15.9

## 2020-03-11 LAB — CBC
HCT: 45.2 % (ref 39.0–52.0)
Hemoglobin: 14.1 g/dL (ref 13.0–17.0)
MCH: 29.5 pg (ref 26.0–34.0)
MCHC: 31.2 g/dL (ref 30.0–36.0)
MCV: 94.6 fL (ref 80.0–100.0)
Platelets: 174 10*3/uL (ref 150–400)
RBC: 4.78 MIL/uL (ref 4.22–5.81)
RDW: 14.3 % (ref 11.5–15.5)
WBC: 5.3 10*3/uL (ref 4.0–10.5)
nRBC: 0 % (ref 0.0–0.2)

## 2020-03-11 LAB — URINALYSIS, ROUTINE W REFLEX MICROSCOPIC
Glucose, UA: NEGATIVE mg/dL
Hgb urine dipstick: NEGATIVE
Ketones, ur: NEGATIVE mg/dL
Leukocytes,Ua: NEGATIVE
Nitrite: NEGATIVE
Protein, ur: NEGATIVE mg/dL
Specific Gravity, Urine: 1.024 (ref 1.005–1.030)
pH: 5 (ref 5.0–8.0)

## 2020-03-11 LAB — HEPATITIS PANEL, ACUTE
HCV Ab: REACTIVE — AB
Hep A IgM: REACTIVE — AB
Hep B C IgM: NONREACTIVE
Hepatitis B Surface Ag: NONREACTIVE

## 2020-03-11 LAB — COMPREHENSIVE METABOLIC PANEL
ALT: 1195 U/L — ABNORMAL HIGH (ref 0–44)
AST: 678 U/L — ABNORMAL HIGH (ref 15–41)
Albumin: 3.3 g/dL — ABNORMAL LOW (ref 3.5–5.0)
Alkaline Phosphatase: 107 U/L (ref 38–126)
Anion gap: 8 (ref 5–15)
BUN: 9 mg/dL (ref 6–20)
CO2: 26 mmol/L (ref 22–32)
Calcium: 9.1 mg/dL (ref 8.9–10.3)
Chloride: 104 mmol/L (ref 98–111)
Creatinine, Ser: 0.8 mg/dL (ref 0.61–1.24)
GFR calc Af Amer: >60 mL/min (ref >60)
GFR calc non Af Amer: >60 mL/min (ref >60)
Glucose, Bld: 153 mg/dL — ABNORMAL HIGH (ref 70–99)
Potassium: 4.6 mmol/L (ref 3.5–5.1)
Sodium: 138 mmol/L (ref 135–145)
Total Bilirubin: 7.4 mg/dL — ABNORMAL HIGH (ref 0.3–1.2)
Total Protein: 7.8 g/dL (ref 6.5–8.1)

## 2020-03-11 LAB — RAPID URINE DRUG SCREEN, HOSP PERFORMED
Amphetamines: POSITIVE — AB
Barbiturates: NOT DETECTED
Benzodiazepines: NOT DETECTED
Cocaine: NOT DETECTED
Opiates: NOT DETECTED
Tetrahydrocannabinol: POSITIVE — AB

## 2020-03-11 LAB — BILIRUBIN, DIRECT: Bilirubin, Direct: 3.6 mg/dL — ABNORMAL HIGH (ref 0.0–0.2)

## 2020-03-11 LAB — RAPID HIV SCREEN (HIV 1/2 AB+AG)
HIV 1/2 Antibodies: NONREACTIVE
HIV-1 P24 Antigen - HIV24: NONREACTIVE

## 2020-03-11 LAB — LIPASE, BLOOD: Lipase: 19 U/L (ref 11–51)

## 2020-03-11 LAB — CK: Total CK: 268 U/L (ref 49–397)

## 2020-03-11 LAB — PROTIME-INR
INR: 1.1 (ref 0.8–1.2)
Prothrombin Time: 14.5 seconds (ref 11.4–15.2)

## 2020-03-11 LAB — ACETAMINOPHEN LEVEL: Acetaminophen (Tylenol), Serum: <10 ug/mL — ABNORMAL LOW (ref 10–30)

## 2020-03-11 MED ORDER — SODIUM CHLORIDE 0.9% FLUSH
3.0000 mL | Freq: Once | INTRAVENOUS | Status: AC
  Administered 2020-03-11: 3 mL via INTRAVENOUS

## 2020-03-11 MED ORDER — SODIUM CHLORIDE 0.9 % IV BOLUS
1000.0000 mL | Freq: Once | INTRAVENOUS | Status: AC
  Administered 2020-03-11: 1000 mL via INTRAVENOUS

## 2020-03-11 NOTE — Telephone Encounter (Signed)
Jaundice o/w Asx Polysubstance abuser New + HCV Ab HAV IgM + [place] neg Rec check INR and if ok dc  We will call patient next week and offer appointment w/ APP  He needs advice re: HAV/HCV transmission precautions also

## 2020-03-11 NOTE — ED Triage Notes (Signed)
Pt in reports abd pain, gen weakness and yellow sclera X3 days. States similar episode happened recently but S/S resolved within a few days. Reports recent Heroin use - but states he snorts vs inject

## 2020-03-11 NOTE — ED Provider Notes (Signed)
[facility] Provider Note   CSN: [id number] Arrival date & time: 03/11/20  1736     History Chief Complaint  Patient presents with  . Jaundice    [facility] is a 39 y.o. male with past medical history significant for substance abuse, tobacco abuse, rhabdomyolysis who presents for evaluation of jaundice.  Patient notes over the last 4 days he has had progressive yellowing to his skin in his eyes.  Initially had some pain to his epigastric and right upper quadrant however none currently.  States he did have diarrhea yesterday however none today.  No melena or bright red blood per rectum.  Does admit to polysubstance abuse however states he does not "shoot up anymore."  Admits to sniffing and eating illicit substances.  Had similar symptoms 1 month ago however they self resolved.  Has also noted some darkened urine.  Denies fever, chills, nausea, vomiting, chest pain, shortness of breath, constipation.  Denies aggravating or alleviating factors.  Denies any chronic alcohol use.  Denies sick contacts.  He is not followed by GI.  Denies prior diagnosis of hepatitis.  Hx of myositis. Denies redness, swelling, warmth to extremities.  History obtained from patient and past medical record.  No interpreter used.  HPI     Past Medical History:  Diagnosis Date  . Overdose     Patient Active Problem List   Diagnosis Date Noted  . Rhabdomyolysis 06/30/2019  . Myositis of right thigh 06/29/2019  . History of substance abuse ([place]) 03/27/2016  . Irregular cardiac rhythm 03/27/2016  . TOBACCO ABUSE 03/12/2008  . VENEREAL WART 12/27/2007  . ANXIETY 02/07/2007  . PANIC ATTACKS 02/07/2007    Past Surgical History:  Procedure Laterality Date  . MANDIBLE FRACTURE SURGERY     Altercation       Family History  Problem Relation Age of Onset  . Hypertension Mother   . Hyperlipidemia Mother   . Diabetes Father     Social History   Tobacco Use   . Smoking status: Current Some Day Smoker    Packs/day: 1.00    Years: 15.00    Pack years: 15.00    Types: Cigarettes  . Smokeless tobacco: Never Used  Substance Use Topics  . Alcohol use: Yes    Alcohol/week: 0.0 standard drinks  . Drug use: Yes    Types: Methamphetamines, MDMA (Ecstacy), Cocaine, IV    Home Medications Prior to Admission medications   Medication Sig Start Date End Date Taking? Authorizing Provider  loratadine (CLARITIN) 10 MG tablet Take 10 mg by mouth daily.   Yes [provider]  fluconazole (DIFLUCAN) 150 MG tablet Take 2 tablets today. Repeat in 1 week. Patient not taking: Reported on 10/22/2018 03/27/16   Harrison Mons, PA    Allergies    Patient has no known allergies.  Review of Systems   Review of Systems  Constitutional: Negative.   HENT: Negative.   Respiratory: Negative.   Cardiovascular: Negative.   Gastrointestinal: Positive for abdominal pain (Yesterday none today) and diarrhea (Yesterday none today). Negative for abdominal distention, anal bleeding, blood in stool, constipation, nausea, rectal pain and vomiting.  Genitourinary:       Dark urine  Musculoskeletal: Negative.   Skin: Negative.   Neurological: Positive for weakness (Generalized).  All other systems reviewed and are negative.   Physical Exam Updated Vital Signs BP 120/87   Pulse (!) 49   Temp 98.1 F (36.7 C) (Oral)  Resp 17   Ht '6\' 3"'  (1.905 m)   Wt 99.8 kg   SpO2 99%   BMI 27.50 kg/m   Physical Exam Vitals and nursing note reviewed.  Constitutional:      General: He is not in acute distress.    Appearance: He is well-developed. He is not ill-appearing, toxic-appearing or diaphoretic.  HENT:     Head: Normocephalic and atraumatic.     Nose: Nose normal.     Mouth/Throat:     Mouth: Mucous membranes are moist.     Pharynx: Oropharynx is clear.  Eyes:     General: Scleral icterus present.     Pupils: Pupils are equal, round, and reactive to  light.     Comments: Scleral icterus  Cardiovascular:     Rate and Rhythm: Normal rate and regular rhythm.     Pulses: Normal pulses.     Heart sounds: Normal heart sounds.  Pulmonary:     Effort: Pulmonary effort is normal. No respiratory distress.     Breath sounds: Normal breath sounds.  Abdominal:     General: Bowel sounds are normal. There is no distension.     Palpations: Abdomen is soft.     Comments: Soft, nontender with rebound or guarding.  Negative Murphy sign.  Musculoskeletal:        General: Normal range of motion.     Cervical back: Normal range of motion and neck supple.     Comments: Moves all 4 extremities at difficulty.  No bony tenderness.  Compartment soft.  Skin:    General: Skin is warm and dry.     Capillary Refill: Capillary refill takes less than 2 seconds.     Coloration: Skin is jaundiced.  Neurological:     General: No focal deficit present.     Mental Status: He is alert and oriented to person, place, and time.     ED Results / Procedures / Treatments   Labs (all labs ordered are listed, but only abnormal results are displayed) Labs Reviewed  COMPREHENSIVE METABOLIC PANEL - Abnormal; Notable for the following components:      Result Value   Glucose, Bld 153 (*)    Albumin 3.3 (*)    AST 678 (*)    ALT 1,195 (*)    Total Bilirubin 7.4 (*)    All other components within normal limits  URINALYSIS, ROUTINE W REFLEX MICROSCOPIC - Abnormal; Notable for the following components:   Color, Urine AMBER (*)    Bilirubin Urine SMALL (*)    All other components within normal limits  ACETAMINOPHEN LEVEL - Abnormal; Notable for the following components:   Acetaminophen (Tylenol), Serum <10 (*)    All other components within normal limits  RAPID URINE DRUG SCREEN, HOSP PERFORMED - Abnormal; Notable for the following components:   Amphetamines POSITIVE (*)    Tetrahydrocannabinol POSITIVE (*)    All other components within normal limits  LIPASE, BLOOD    CBC  RAPID HIV SCREEN (HIV 1/2 AB+AG)  CK  HEPATITIS PANEL, ACUTE    EKG None  Radiology No results found.  Procedures Procedures (including critical care time)  Medications Ordered in ED Medications  sodium chloride flush (NS) 0.9 % injection 3 mL (3 mLs Intravenous Given 03/11/20 1901)  sodium chloride 0.9 % bolus 1,000 mL (1,000 mLs Intravenous New Bag/Given 03/11/20 1923)    ED Course  I have reviewed the triage vital signs and the nursing notes.  Pertinent labs & imaging  results that were available during my care of the patient were reviewed by me and considered in my medical decision making (see chart for details).  39 year old male known polysubstance abuse presents for rash and jaundice x4 days.  He is afebrile, nonseptic, non-ill-appearing.  Patient with scleral icterus and marked jaundice to skin.  Abdomen soft, nontender.  Not followed by GI.  Denies Tylenol use or alcohol use.  States he does not inject illicit substance however rather snorts and eats them.  Labs obtained from triage.  Will give fluids and additional labs as well as imaging.  Labs and imaging personally reviewed and interpreted CBC without leukocytosis, hemoglobin stable Metabolic panel with mild hyperglycemia to 153, marked LFTs, however normal alk phos.  Bili 7.4 Lipase 19 CK 268 Hepatitis panel pending UDS positive for amphetamines and THC UA negative for infection, bili present  Care transferred to Dr. Wilson Singer he will plan to follow-up after imaging and determine disposition    MDM Rules/Calculators/A&P                       Final Clinical Impression(s) / ED Diagnoses Final diagnoses:  Elevated LFTs  Amphetamine user [facility])    Rx / DC Orders ED Discharge Orders    None       Jailene Cupit A, PA-C 03/11/20 2029    Virgel Manifold, MD 03/12/20 1835

## 2020-03-15 NOTE — Telephone Encounter (Signed)
Left message for patient to call back  

## 2020-03-16 NOTE — Telephone Encounter (Signed)
Left message for patient to call back  

## 2020-03-17 NOTE — Telephone Encounter (Signed)
Left message for patient to call back  

## 2020-03-18 NOTE — Telephone Encounter (Signed)
Noted.  If he has a mailing address send a letter but you can stop calling,

## 2020-03-18 NOTE — Telephone Encounter (Signed)
Dr. Leone Payor no return calls from the patient

## 2020-03-31 ENCOUNTER — Ambulatory Visit: Payer: Self-pay | Admitting: Internal Medicine

## 2020-04-23 ENCOUNTER — Encounter: Payer: Self-pay | Admitting: Internal Medicine

## 2020-04-23 ENCOUNTER — Other Ambulatory Visit (INDEPENDENT_AMBULATORY_CARE_PROVIDER_SITE_OTHER): Payer: Self-pay

## 2020-04-23 ENCOUNTER — Ambulatory Visit (INDEPENDENT_AMBULATORY_CARE_PROVIDER_SITE_OTHER): Payer: Self-pay | Admitting: Internal Medicine

## 2020-04-23 VITALS — BP 122/84 | HR 84 | Temp 97.8°F | Ht 75.0 in | Wt 221.2 lb

## 2020-04-23 DIAGNOSIS — B159 Hepatitis A without hepatic coma: Secondary | ICD-10-CM

## 2020-04-23 DIAGNOSIS — F1920 Other psychoactive substance dependence, uncomplicated: Secondary | ICD-10-CM

## 2020-04-23 DIAGNOSIS — R7689 Other specified abnormal immunological findings in serum: Secondary | ICD-10-CM

## 2020-04-23 DIAGNOSIS — R768 Other specified abnormal immunological findings in serum: Secondary | ICD-10-CM

## 2020-04-23 LAB — HEPATIC FUNCTION PANEL
ALT: 53 U/L (ref 0–53)
AST: 35 U/L (ref 0–37)
Albumin: 4 g/dL (ref 3.5–5.2)
Alkaline Phosphatase: 76 U/L (ref 39–117)
Bilirubin, Direct: 0.5 mg/dL — ABNORMAL HIGH (ref 0.0–0.3)
Total Bilirubin: 1 mg/dL (ref 0.2–1.2)
Total Protein: 7.3 g/dL (ref 6.0–8.3)

## 2020-04-23 NOTE — Patient Instructions (Signed)
Your provider has requested that you go to the basement level for lab work before leaving today. Press "B" on the elevator. The lab is located at the first door on the left as you exit the elevator.   We are giving you information to read on Hepatitis A and also about rehab centers in [place].    I appreciate the opportunity to care for you. Stan Head, MD, [facility]

## 2020-04-23 NOTE — Progress Notes (Signed)
   [facility] 39 y.o. July 14, 1981 [id number]  Assessment & Plan:   Encounter Diagnoses  Name Primary?  . Viral hepatitis A without hepatic coma Yes  . Hepatitis C antibody positive in blood   . Addiction to drug [facility])     I suspect his hepatitis A is resolved.  We will confirm that he has immunity with a hepatitis A total antibody.  Evaluate abnormal hepatitis C antibody with an RNA test.  He indicates he is trying to stop his addiction he has never been to rehab.  He was given a toll-free number look for free health and a list of possible rehab centers. He seems to understand he has a problem and needs to stop using drugs and needs to take the next steps. He is not sharing needles and uses a condom for intercourse.     Subjective:   Chief Complaint: Hepatitis A and jaundice  HPI The patient is a 39 year old man who was in the emergency department on April 1 with jaundice and abnormal LFTs and had a positive hepatitis A IgM and a positive hepatitis C antibody but negative hepatitis B studies.  An ultrasound suggested fatty liver.  I had spoken to the doctor on call that night and recommended we follow-up his labs to make sure that his jaundice clears etc. No Known Allergies No outpatient medications have been marked as taking for the 04/23/20 encounter (Office Visit) with Iva Boop, MD.   Past Medical History:  Diagnosis Date  . Acute hepatitis A 03/2020  . Anxiety   . Carpal tunnel syndrome    bilateral  . Drug addiction ([facility])   . Elevated LFTs   . Inguinal hernia    left  . Overdose   . Panic attacks   . Tobacco abuse   . Venereal wart    Past Surgical History:  Procedure Laterality Date  . 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   Review of Systems  As per HPI Objective:   Physical Exam BP 122/84   Pulse 84   Temp 97.8 F (36.6 C)   Ht 6\' 3"  (1.905 m)   Wt 221 lb 3.2 oz (100.3 kg)   SpO2 97%   BMI 27.65 kg/m  Well-developed well-nourished overweight white man Eyes anicteric Mouth shows teeth with some caries no other damage Lungs clear Heart sounds are normal Abdomen is soft and nontender without organomegaly or mass

## 2020-04-26 LAB — HEPATITIS A ANTIBODY, TOTAL: Hepatitis A AB,Total: REACTIVE — AB

## 2020-04-30 LAB — HCV RNA NAA QUAL RFX TO QUANT: HCV RNA NAA Qualitative: POSITIVE — AB

## 2020-04-30 LAB — HCV REAL-TIME, PCR, QUANT

## 2020-05-04 ENCOUNTER — Encounter: Payer: Self-pay | Admitting: Internal Medicine

## 2020-05-04 DIAGNOSIS — B1920 Unspecified viral hepatitis C without hepatic coma: Secondary | ICD-10-CM | POA: Insufficient documentation

## 2020-06-09 ENCOUNTER — Ambulatory Visit: Payer: Self-pay | Admitting: Internal Medicine

## 2020-07-02 ENCOUNTER — Emergency Department (HOSPITAL_COMMUNITY)
Admission: EM | Admit: 2020-07-02 | Discharge: 2020-07-03 | Disposition: A | Discharge status: Home / Self Care | Payer: Self-pay | Attending: Emergency Medicine | Admitting: Emergency Medicine

## 2020-07-02 ENCOUNTER — Encounter (HOSPITAL_COMMUNITY): Payer: Self-pay | Admitting: Emergency Medicine

## 2020-07-02 ENCOUNTER — Emergency Department (HOSPITAL_COMMUNITY): Payer: Self-pay

## 2020-07-02 ENCOUNTER — Other Ambulatory Visit: Payer: Self-pay

## 2020-07-02 DIAGNOSIS — M79644 Pain in right finger(s): Secondary | ICD-10-CM | POA: Insufficient documentation

## 2020-07-02 DIAGNOSIS — F17210 Nicotine dependence, cigarettes, uncomplicated: Secondary | ICD-10-CM | POA: Insufficient documentation

## 2020-07-02 NOTE — ED Triage Notes (Signed)
Patient reports right hand swelling onset yesterday denies injury , no fever or chills .

## 2020-07-03 MED ORDER — DOXYCYCLINE HYCLATE 100 MG PO CAPS: 100.0000 mg | ORAL_CAPSULE | Freq: Two times a day (BID) | ORAL | 0 refills | Status: AC

## 2020-07-03 MED ORDER — DOXYCYCLINE HYCLATE 100 MG PO TABS
100.0000 mg | ORAL_TABLET | Freq: Once | ORAL | Status: AC
  Administered 2020-07-03: 100 mg via ORAL
  Filled 2020-07-03: qty 1

## 2020-07-03 NOTE — ED Notes (Signed)
Pt called x3 for vital signs. No response. Pt not seen within waiting room or waiting room bathrooms.

## 2020-07-03 NOTE — Progress Notes (Signed)
Orthopedic Tech Progress Note Patient Details:  [facility] 18-Sep-1981 [id number] Patient refused thumb spica Patient ID: Terrence Henry, male   DOB: 1981-04-14, 39 y.o.   MRN: [id number]   [facility] 07/03/2020, 11:50 AM

## 2020-07-03 NOTE — ED Notes (Signed)
Pt refused thumb spica. AVS reviewed with pt who verbalized understanding. Pt ambulatory out of dept.

## 2020-07-03 NOTE — Discharge Instructions (Addendum)
At this time there does not appear to be the presence of an emergent medical condition, however there is always the potential for conditions to change. Please read and follow the below instructions.  Please return to the Emergency Department immediately for any new or worsening symptoms. Please be sure to follow up with your Primary Care Provider within one week regarding your visit today; please call their office to schedule an appointment even if you are feeling better for a follow-up visit. Please take your antibiotic Doxycycline as prescribed until complete to help with your symptoms.  Please drink enough water to avoid dehydration and get plenty of rest. You have been given referral to the hand surgeon Dr. Roney Mans for follow-up.  Please use the thumb spica splint to help protect your thumb from further injury.  Call his office today to schedule the follow-up appointment. Please have your thumb rechecked in 2-3 days.  You may have your thumb rechecked by the hand specialist, and urgent care or here at the emergency department to ensure improvement of symptoms.  Return to the ER immediately if you develop any new or worsening symptoms including fever, redness, swelling.  Get help right away if: Your thumb feels numb, tingles, turns cold, or turns blue, even after loosening your splint or bandage (if applicable). You have fever or chills You have redness or swelling You have any new/concerning or worsening of symptoms  Please read the additional information packets attached to your discharge summary.  Do not take your medicine if  develop an itchy rash, swelling in your mouth or lips, or difficulty breathing; call 911 and seek immediate emergency medical attention if this occurs.  You may review your lab tests and imaging results in their entirety on your MyChart account.  Please discuss all results of fully with your primary care provider and other specialist at your follow-up visit.  Note:  Portions of this text may have been transcribed using voice recognition software. Every effort was made to ensure accuracy; however, inadvertent computerized transcription errors may still be present.

## 2020-07-03 NOTE — ED Provider Notes (Signed)
[facility] EMERGENCY DEPARTMENT Provider Note   CSN: [id number] Arrival date & time: 07/02/20  2259     History Chief Complaint  Patient presents with  . Hand Swelling    [facility] is a 39 y.o. male history of polysubstance abuse, hepatitis C.  Patient presents today for right thumb pain and swelling.  Onset yesterday uncertain if he may have struck his thumb on something.  He reports mild aching pain of the thumb diffusely worsened with palpation improved with rest, pain does not radiate.  He feels that his thumb may be slightly swollen cannot tell if this is new today.  He denies any erythema, fever, nausea/vomiting, numbness/weakness, tingling or any additional concerns.  HPI     Past Medical History:  Diagnosis Date  . Acute hepatitis A 03/2020  . Anxiety   . Carpal tunnel syndrome    bilateral  . Drug addiction ([facility])   . Elevated LFTs   . Hepatitis C virus infection   . Inguinal hernia    left  . Overdose   . Panic attacks   . Tobacco abuse   . Venereal wart     Patient Active Problem List   Diagnosis Date Noted  . Hepatitis C virus infection   . Rhabdomyolysis 06/30/2019  . Myositis of right thigh 06/29/2019  . History of substance abuse ([facility]) 03/27/2016  . Irregular cardiac rhythm 03/27/2016  . TOBACCO ABUSE 03/12/2008  . VENEREAL WART 12/27/2007  . ANXIETY 02/07/2007  . PANIC ATTACKS 02/07/2007    Past Surgical History:  Procedure Laterality Date  . MANDIBLE FRACTURE SURGERY     Altercation       Family History  Problem Relation Age of Onset  . Hypertension Mother   . Hyperlipidemia Mother   . Diabetes Father     Social History   Tobacco Use  . Smoking status: Current Some Day Smoker    Packs/day: 1.00    Years: 15.00    Pack years: 15.00    Types: Cigarettes  . Smokeless tobacco: Never Used  Substance Use Topics  . Alcohol use: Yes    Alcohol/week: 0.0 standard drinks  . Drug use: Yes    Types:  Methamphetamines, MDMA (Ecstacy), Cocaine, IV    Comment: Heroin    Home Medications Prior to Admission medications   Medication Sig Start Date End Date Taking? Authorizing Provider  doxycycline (VIBRAMYCIN) 100 MG capsule Take 1 capsule (100 mg total) by mouth 2 (two) times daily for 10 days. 07/03/20 07/13/20  Harlene Salts A, PA-C  loratadine (CLARITIN) 10 MG tablet Take 10 mg by mouth daily.    [provider]    Allergies    Patient has no known allergies.  Review of Systems   Review of Systems  Constitutional: Negative.  Negative for chills and fever.  Gastrointestinal: Negative for abdominal pain, nausea and vomiting.  Musculoskeletal: Positive for arthralgias. Negative for back pain and neck pain.  Skin: Negative.  Negative for color change and wound.  Neurological: Negative.  Negative for weakness, numbness and headaches.    Physical Exam Updated Vital Signs BP (!) 108/51 (BP Location: Right Arm)   Pulse 51   Temp 98.3 F (36.8 C) (Oral)   Resp 16   Ht 6\' 3"  (1.905 m)   Wt (!) 108 kg   SpO2 100%   BMI 29.76 kg/m   Physical Exam Constitutional:      General: He is not in acute distress.  Appearance: Normal appearance. He is well-developed. He is not ill-appearing or diaphoretic.  HENT:     Head: Normocephalic and atraumatic.  Eyes:     General: Vision grossly intact. Gaze aligned appropriately.     Pupils: Pupils are equal, round, and reactive to light.  Neck:     Trachea: Trachea and phonation normal.  Cardiovascular:     Rate and Rhythm: Normal rate and regular rhythm.     Pulses:          Radial pulses are 2+ on the right side and 2+ on the left side.  Pulmonary:     Effort: Pulmonary effort is normal. No respiratory distress.  Abdominal:     General: There is no distension.     Palpations: Abdomen is soft.     Tenderness: There is no abdominal tenderness. There is no guarding or rebound.  Musculoskeletal:        General: Normal range of  motion.     Cervical back: Normal range of motion.     Comments: Right hand: No gross deformities, skin intact.  Minimal diffuse swelling of the right thumb without erythema.  No fluctuance or induration, no skin breaks.  No tenderness to palpation.  Flexion and extension is intact as well as thumb opposition, minimal pain with movement. No snuffbox tenderness to palpation. No tenderness to palpation over flexor sheath. Finger adduction/abduction intact with 5/5 strength.  Thumb opposition intact. Full active and resisted ROM to flexion/extension at wrist, MCP, PIP and DIP of all fingers.  FDS/FDP intact. Grip 5/5 strength.  Radial artery 2+ with <2sec cap refill in all fingers.  Sensation intact to light-tough in median/ulnar/radial distributions.  Skin:    General: Skin is warm and dry.  Neurological:     Mental Status: He is alert.     GCS: GCS eye subscore is 4. GCS verbal subscore is 5. GCS motor subscore is 6.     Comments: Speech is clear and goal oriented, follows commands Major Cranial nerves without deficit, no facial droop Moves extremities without ataxia, coordination intact  Psychiatric:        Behavior: Behavior normal.       ED Results / Procedures / Treatments   Labs (all labs ordered are listed, but only abnormal results are displayed) Labs Reviewed - No data to display  EKG None  Radiology DG Hand Complete Right  Result Date: 07/03/2020 CLINICAL DATA:  Right hand swelling EXAM: RIGHT HAND - COMPLETE 3+ VIEW COMPARISON:  None. FINDINGS: There is no evidence of fracture or dislocation. There is no evidence of arthropathy or other focal bone abnormality. Soft tissues are unremarkable. IMPRESSION: Negative. Electronically Signed   By: Helyn Numbers MD   On: 07/03/2020 00:12    Procedures Procedures (including critical care time)  Medications Ordered in ED Medications  doxycycline (VIBRA-TABS) tablet 100 mg (has no administration in time range)    ED Course  I  have reviewed the triage vital signs and the nursing notes.  Pertinent labs & imaging results that were available during my care of the patient were reviewed by me and considered in my medical decision making (see chart for details).    MDM Rules/Calculators/A&P                          Additional history obtained from: 1. Nursing notes from this visit. 2. Electronic medical record reviewed. ------------------------------ DG right hand:    IMPRESSION:  Negative.  I have personally reviewed patient's right hand x-ray and agree with radiologist interpretation.  39 year old male presents with pain and minimal swelling of the right thumb.  He is neurovascularly intact, good capillary refill and sensation, strong equal radial pulses.  Movement is intact at the right thumb with minimal increase in pain.  There are no skin breaks and no significant erythema.  No fluctuance or induration suggestive of abscess.  X-ray above is reassuring.  Possible the patient may have a sprain of his thumb, though he does not remember any injuries from yesterday.  Patient was placed in a thumb spica splint.  Patient is high risk for infection given continued IV drug use does not appear to be any evidence of septic arthritis, osteomyelitis, cellulitis at this time however given his past medical history feel it is beneficial to start patient on doxycycline at this time in case this is an early infection.  He is well-appearing in no acute distress no negation for blood work at this time.  Referral given to hand specialist for follow-up.  I encourage patient to have his thumb rechecked in 2-3 days either with the hand specialist, here at the emergency department or at an urgent care.  He is aware to return to the emergency department immediately if he develops signs of infection or any new or worsening symptoms  Discussed case and reviewed imaging with Dr. Rodena Medin who agrees with discharge with outpatient follow-up and  doxycycline.  At this time there does not appear to be any evidence of an acute emergency medical condition and the patient appears stable for discharge with appropriate outpatient follow up. Diagnosis was discussed with patient who verbalizes understanding of care plan and is agreeable to discharge. I have discussed return precautions with patient who verbalizes understanding. Patient encouraged to follow-up with their PCP and Hand. All questions answered.    Note: Portions of this report may have been transcribed using voice recognition software. Every effort was made to ensure accuracy; however, inadvertent computerized transcription errors may still be present.  Final Clinical Impression(s) / ED Diagnoses Final diagnoses:  Pain of right thumb    Rx / DC Orders ED Discharge Orders         Ordered    doxycycline (VIBRAMYCIN) 100 MG capsule  2 times daily     Discontinue  Reprint     07/03/20 1114           Elizabeth Palau 07/03/20 1119    Wynetta Fines, MD 07/05/20 1455

## 2020-10-31 ENCOUNTER — Inpatient Hospital Stay (HOSPITAL_COMMUNITY)
Admission: EM | Admit: 2020-10-31 | Discharge: 2020-11-18 | DRG: 917 | Disposition: A | Discharge status: Home / Self Care | Payer: Self-pay | Attending: Internal Medicine | Admitting: Internal Medicine

## 2020-10-31 ENCOUNTER — Emergency Department (HOSPITAL_COMMUNITY): Payer: Self-pay

## 2020-10-31 ENCOUNTER — Inpatient Hospital Stay (HOSPITAL_COMMUNITY): Payer: Self-pay

## 2020-10-31 DIAGNOSIS — E874 Mixed disorder of acid-base balance: Secondary | ICD-10-CM | POA: Diagnosis present

## 2020-10-31 DIAGNOSIS — R579 Shock, unspecified: Secondary | ICD-10-CM

## 2020-10-31 DIAGNOSIS — R739 Hyperglycemia, unspecified: Secondary | ICD-10-CM | POA: Diagnosis not present

## 2020-10-31 DIAGNOSIS — Z20822 Contact with and (suspected) exposure to covid-19: Secondary | ICD-10-CM | POA: Diagnosis present

## 2020-10-31 DIAGNOSIS — I469 Cardiac arrest, cause unspecified: Secondary | ICD-10-CM | POA: Diagnosis present

## 2020-10-31 DIAGNOSIS — T17908S Unspecified foreign body in respiratory tract, part unspecified causing other injury, sequela: Secondary | ICD-10-CM

## 2020-10-31 DIAGNOSIS — I4901 Ventricular fibrillation: Secondary | ICD-10-CM | POA: Diagnosis present

## 2020-10-31 DIAGNOSIS — D72829 Elevated white blood cell count, unspecified: Secondary | ICD-10-CM | POA: Diagnosis not present

## 2020-10-31 DIAGNOSIS — I21A1 Myocardial infarction type 2: Secondary | ICD-10-CM

## 2020-10-31 DIAGNOSIS — T68XXXA Hypothermia, initial encounter: Secondary | ICD-10-CM | POA: Insufficient documentation

## 2020-10-31 DIAGNOSIS — Z8249 Family history of ischemic heart disease and other diseases of the circulatory system: Secondary | ICD-10-CM

## 2020-10-31 DIAGNOSIS — T401X1A Poisoning by heroin, accidental (unintentional), initial encounter: Principal | ICD-10-CM | POA: Diagnosis present

## 2020-10-31 DIAGNOSIS — Z83438 Family history of other disorder of lipoprotein metabolism and other lipidemia: Secondary | ICD-10-CM

## 2020-10-31 DIAGNOSIS — F419 Anxiety disorder, unspecified: Secondary | ICD-10-CM | POA: Diagnosis not present

## 2020-10-31 DIAGNOSIS — R197 Diarrhea, unspecified: Secondary | ICD-10-CM | POA: Diagnosis not present

## 2020-10-31 DIAGNOSIS — I462 Cardiac arrest due to underlying cardiac condition: Secondary | ICD-10-CM | POA: Diagnosis present

## 2020-10-31 DIAGNOSIS — N179 Acute kidney failure, unspecified: Secondary | ICD-10-CM | POA: Diagnosis not present

## 2020-10-31 DIAGNOSIS — D649 Anemia, unspecified: Secondary | ICD-10-CM | POA: Diagnosis not present

## 2020-10-31 DIAGNOSIS — R0902 Hypoxemia: Secondary | ICD-10-CM

## 2020-10-31 DIAGNOSIS — B1920 Unspecified viral hepatitis C without hepatic coma: Secondary | ICD-10-CM | POA: Diagnosis present

## 2020-10-31 DIAGNOSIS — I447 Left bundle-branch block, unspecified: Secondary | ICD-10-CM | POA: Diagnosis present

## 2020-10-31 DIAGNOSIS — K5900 Constipation, unspecified: Secondary | ICD-10-CM

## 2020-10-31 DIAGNOSIS — F17210 Nicotine dependence, cigarettes, uncomplicated: Secondary | ICD-10-CM | POA: Diagnosis present

## 2020-10-31 DIAGNOSIS — J9602 Acute respiratory failure with hypercapnia: Secondary | ICD-10-CM | POA: Diagnosis present

## 2020-10-31 DIAGNOSIS — Z978 Presence of other specified devices: Secondary | ICD-10-CM

## 2020-10-31 DIAGNOSIS — Z452 Encounter for adjustment and management of vascular access device: Secondary | ICD-10-CM

## 2020-10-31 DIAGNOSIS — R4189 Other symptoms and signs involving cognitive functions and awareness: Secondary | ICD-10-CM | POA: Diagnosis present

## 2020-10-31 DIAGNOSIS — Z833 Family history of diabetes mellitus: Secondary | ICD-10-CM

## 2020-10-31 DIAGNOSIS — E876 Hypokalemia: Secondary | ICD-10-CM | POA: Diagnosis present

## 2020-10-31 DIAGNOSIS — R680 Hypothermia, not associated with low environmental temperature: Secondary | ICD-10-CM | POA: Diagnosis present

## 2020-10-31 DIAGNOSIS — Z0189 Encounter for other specified special examinations: Secondary | ICD-10-CM

## 2020-10-31 DIAGNOSIS — J9601 Acute respiratory failure with hypoxia: Secondary | ICD-10-CM | POA: Diagnosis present

## 2020-10-31 DIAGNOSIS — R509 Fever, unspecified: Secondary | ICD-10-CM

## 2020-10-31 DIAGNOSIS — G931 Anoxic brain damage, not elsewhere classified: Secondary | ICD-10-CM | POA: Diagnosis not present

## 2020-10-31 DIAGNOSIS — Z781 Physical restraint status: Secondary | ICD-10-CM

## 2020-10-31 DIAGNOSIS — Z4659 Encounter for fitting and adjustment of other gastrointestinal appliance and device: Secondary | ICD-10-CM

## 2020-10-31 HISTORY — DX: Cardiac arrest, cause unspecified: I46.9

## 2020-10-31 LAB — I-STAT VENOUS BLOOD GAS, ED
Acid-base deficit: 18 mmol/L — ABNORMAL HIGH (ref 0.0–2.0)
Bicarbonate: 15.4 mmol/L — ABNORMAL LOW (ref 20.0–28.0)
Calcium, Ion: 1.06 mmol/L — ABNORMAL LOW (ref 1.15–1.40)
HCT: 54 % — ABNORMAL HIGH (ref 39.0–52.0)
Hemoglobin: 18.4 g/dL — ABNORMAL HIGH (ref 13.0–17.0)
O2 Saturation: 84 %
Potassium: 3.8 mmol/L (ref 3.5–5.1)
Sodium: 142 mmol/L (ref 135–145)
TCO2: 17 mmol/L — ABNORMAL LOW (ref 22–32)
pCO2, Ven: 66.3 mmHg — ABNORMAL HIGH (ref 44.0–60.0)
pH, Ven: 6.973 — CL (ref 7.250–7.430)
pO2, Ven: 77 mmHg — ABNORMAL HIGH (ref 32.0–45.0)

## 2020-10-31 LAB — URINALYSIS, ROUTINE W REFLEX MICROSCOPIC
Bilirubin Urine: NEGATIVE
Glucose, UA: 50 mg/dL — AB
Ketones, ur: NEGATIVE mg/dL
Leukocytes,Ua: NEGATIVE
Nitrite: NEGATIVE
Protein, ur: >=300 mg/dL — AB
RBC / HPF: >50 RBC/hpf — ABNORMAL HIGH (ref 0–5)
Specific Gravity, Urine: 1.025 (ref 1.005–1.030)
pH: 6 (ref 5.0–8.0)

## 2020-10-31 LAB — TROPONIN I (HIGH SENSITIVITY)
Troponin I (High Sensitivity): 66 ng/L — ABNORMAL HIGH (ref <18)
Troponin I (High Sensitivity): 67 ng/L — ABNORMAL HIGH (ref <18)
Troponin I (High Sensitivity): 2909 ng/L (ref <18)

## 2020-10-31 LAB — CBC WITH DIFFERENTIAL/PLATELET
Abs Immature Granulocytes: 1.36 10*3/uL — ABNORMAL HIGH (ref 0.00–0.07)
Basophils Absolute: 0.1 10*3/uL (ref 0.0–0.1)
Basophils Relative: 0 %
Eosinophils Absolute: 0.1 10*3/uL (ref 0.0–0.5)
Eosinophils Relative: 1 %
HCT: 57.8 % — ABNORMAL HIGH (ref 39.0–52.0)
Hemoglobin: 17.7 g/dL — ABNORMAL HIGH (ref 13.0–17.0)
Immature Granulocytes: 7 %
Lymphocytes Relative: 44 %
Lymphs Abs: 9 10*3/uL — ABNORMAL HIGH (ref 0.7–4.0)
MCH: 30.3 pg (ref 26.0–34.0)
MCHC: 30.6 g/dL (ref 30.0–36.0)
MCV: 98.8 fL (ref 80.0–100.0)
Monocytes Absolute: 0.4 10*3/uL (ref 0.1–1.0)
Monocytes Relative: 2 %
Neutro Abs: 9.3 10*3/uL — ABNORMAL HIGH (ref 1.7–7.7)
Neutrophils Relative %: 46 %
Platelets: 223 10*3/uL (ref 150–400)
RBC: 5.85 MIL/uL — ABNORMAL HIGH (ref 4.22–5.81)
RDW: 12.4 % (ref 11.5–15.5)
WBC Morphology: INCREASED
WBC: 20.2 10*3/uL — ABNORMAL HIGH (ref 4.0–10.5)
nRBC: 0.3 % — ABNORMAL HIGH (ref 0.0–0.2)

## 2020-10-31 LAB — COMPREHENSIVE METABOLIC PANEL
ALT: 26 U/L (ref 0–44)
AST: 42 U/L — ABNORMAL HIGH (ref 15–41)
Albumin: 2.7 g/dL — ABNORMAL LOW (ref 3.5–5.0)
Alkaline Phosphatase: 53 U/L (ref 38–126)
Anion gap: 18 — ABNORMAL HIGH (ref 5–15)
BUN: 12 mg/dL (ref 6–20)
CO2: 16 mmol/L — ABNORMAL LOW (ref 22–32)
Calcium: 8.4 mg/dL — ABNORMAL LOW (ref 8.9–10.3)
Chloride: 107 mmol/L (ref 98–111)
Creatinine, Ser: 1.13 mg/dL (ref 0.61–1.24)
GFR, Estimated: >60 mL/min (ref >60)
Glucose, Bld: 128 mg/dL — ABNORMAL HIGH (ref 70–99)
Potassium: 4 mmol/L (ref 3.5–5.1)
Sodium: 141 mmol/L (ref 135–145)
Total Bilirubin: 0.7 mg/dL (ref 0.3–1.2)
Total Protein: 5 g/dL — ABNORMAL LOW (ref 6.5–8.1)

## 2020-10-31 LAB — I-STAT ARTERIAL BLOOD GAS, ED
Acid-base deficit: 14 mmol/L — ABNORMAL HIGH (ref 0.0–2.0)
Bicarbonate: 17 mmol/L — ABNORMAL LOW (ref 20.0–28.0)
Calcium, Ion: 1.14 mmol/L — ABNORMAL LOW (ref 1.15–1.40)
HCT: 54 % — ABNORMAL HIGH (ref 39.0–52.0)
Hemoglobin: 18.4 g/dL — ABNORMAL HIGH (ref 13.0–17.0)
O2 Saturation: 100 %
Patient temperature: 92.7
Potassium: 3.4 mmol/L — ABNORMAL LOW (ref 3.5–5.1)
Sodium: 142 mmol/L (ref 135–145)
TCO2: 19 mmol/L — ABNORMAL LOW (ref 22–32)
pCO2 arterial: 48.4 mmHg — ABNORMAL HIGH (ref 32.0–48.0)
pH, Arterial: 7.132 — CL (ref 7.350–7.450)
pO2, Arterial: 504 mmHg — ABNORMAL HIGH (ref 83.0–108.0)

## 2020-10-31 LAB — ECHOCARDIOGRAM COMPLETE
Area-P 1/2: 3.03 cm2
Calc EF: 33.6 %
Height: 75 in
S' Lateral: 3.1 cm
Single Plane A2C EF: 34.3 %
Single Plane A4C EF: 32 %
Weight: 3527.36 oz

## 2020-10-31 LAB — RESPIRATORY PANEL BY RT PCR (FLU A&B, COVID)
Influenza A by PCR: NEGATIVE
Influenza B by PCR: NEGATIVE
SARS Coronavirus 2 by RT PCR: NEGATIVE

## 2020-10-31 LAB — GLUCOSE, CAPILLARY: Glucose-Capillary: 130 mg/dL — ABNORMAL HIGH (ref 70–99)

## 2020-10-31 LAB — LACTIC ACID, PLASMA
Lactic Acid, Venous: 10.1 mmol/L (ref 0.5–1.9)
Lactic Acid, Venous: 6.1 mmol/L (ref 0.5–1.9)

## 2020-10-31 LAB — POCT I-STAT 7, (LYTES, BLD GAS, ICA,H+H)
Acid-base deficit: 11 mmol/L — ABNORMAL HIGH (ref 0.0–2.0)
Bicarbonate: 14.6 mmol/L — ABNORMAL LOW (ref 20.0–28.0)
Calcium, Ion: 1.18 mmol/L (ref 1.15–1.40)
HCT: 59 % — ABNORMAL HIGH (ref 39.0–52.0)
Hemoglobin: 20.1 g/dL — ABNORMAL HIGH (ref 13.0–17.0)
O2 Saturation: 87 %
Patient temperature: 97.3
Potassium: 3.7 mmol/L (ref 3.5–5.1)
Sodium: 138 mmol/L (ref 135–145)
TCO2: 16 mmol/L — ABNORMAL LOW (ref 22–32)
pCO2 arterial: 30.4 mmHg — ABNORMAL LOW (ref 32.0–48.0)
pH, Arterial: 7.285 — ABNORMAL LOW (ref 7.350–7.450)
pO2, Arterial: 57 mmHg — ABNORMAL LOW (ref 83.0–108.0)

## 2020-10-31 LAB — I-STAT CHEM 8, ED
BUN: 16 mg/dL (ref 6–20)
Calcium, Ion: 1.06 mmol/L — ABNORMAL LOW (ref 1.15–1.40)
Chloride: 111 mmol/L (ref 98–111)
Creatinine, Ser: 0.9 mg/dL (ref 0.61–1.24)
Glucose, Bld: 119 mg/dL — ABNORMAL HIGH (ref 70–99)
HCT: 54 % — ABNORMAL HIGH (ref 39.0–52.0)
Hemoglobin: 18.4 g/dL — ABNORMAL HIGH (ref 13.0–17.0)
Potassium: 3.8 mmol/L (ref 3.5–5.1)
Sodium: 142 mmol/L (ref 135–145)
TCO2: 17 mmol/L — ABNORMAL LOW (ref 22–32)

## 2020-10-31 LAB — CBG MONITORING, ED: Glucose-Capillary: 119 mg/dL — ABNORMAL HIGH (ref 70–99)

## 2020-10-31 LAB — ETHANOL: Alcohol, Ethyl (B): <10 mg/dL (ref <10)

## 2020-10-31 MED ORDER — CALCIUM GLUCONATE-NACL 2-0.675 GM/100ML-% IV SOLN
2.0000 g | Freq: Once | INTRAVENOUS | Status: AC
  Administered 2020-10-31: 2000 mg via INTRAVENOUS
  Filled 2020-10-31: qty 100

## 2020-10-31 MED ORDER — MIDAZOLAM HCL 2 MG/2ML IJ SOLN
2.0000 mg | INTRAMUSCULAR | Status: AC | PRN
  Administered 2020-10-31 – 2020-11-05 (×3): 2 mg via INTRAVENOUS
  Filled 2020-10-31 (×3): qty 2

## 2020-10-31 MED ORDER — SODIUM BICARBONATE 8.4 % IV SOLN
INTRAVENOUS | Status: AC
  Filled 2020-10-31: qty 50

## 2020-10-31 MED ORDER — FENTANYL CITRATE (PF) 100 MCG/2ML IJ SOLN
100.0000 ug | INTRAMUSCULAR | Status: DC | PRN
  Administered 2020-11-06 (×2): 100 ug via INTRAVENOUS
  Filled 2020-10-31 (×3): qty 2

## 2020-10-31 MED ORDER — SODIUM BICARBONATE 8.4 % IV SOLN: 50.0000 meq | Freq: Once | INTRAVENOUS | Status: DC

## 2020-10-31 MED ORDER — CHLORHEXIDINE GLUCONATE 0.12% ORAL RINSE (MEDLINE KIT)
15.0000 mL | Freq: Two times a day (BID) | OROMUCOSAL | Status: DC
  Administered 2020-10-31 – 2020-11-06 (×12): 15 mL via OROMUCOSAL

## 2020-10-31 MED ORDER — ENOXAPARIN SODIUM 40 MG/0.4ML ~~LOC~~ SOLN
40.0000 mg | SUBCUTANEOUS | Status: DC
  Administered 2020-10-31 – 2020-11-17 (×18): 40 mg via SUBCUTANEOUS
  Filled 2020-10-31 (×18): qty 0.4

## 2020-10-31 MED ORDER — POLYETHYLENE GLYCOL 3350 17 G PO PACK: 17.0000 g | PACK | Freq: Every day | ORAL | Status: DC | PRN

## 2020-10-31 MED ORDER — ETOMIDATE 2 MG/ML IV SOLN
INTRAVENOUS | Status: AC | PRN
  Administered 2020-10-31: 20 mg via INTRAVENOUS

## 2020-10-31 MED ORDER — FENTANYL CITRATE (PF) 100 MCG/2ML IJ SOLN
100.0000 ug | Freq: Once | INTRAMUSCULAR | Status: AC
  Administered 2020-10-31: 100 ug via INTRAVENOUS
  Filled 2020-10-31: qty 2

## 2020-10-31 MED ORDER — NOREPINEPHRINE 4 MG/250ML-% IV SOLN
INTRAVENOUS | Status: AC | PRN
  Administered 2020-10-31: 20 ug/min via INTRAVENOUS

## 2020-10-31 MED ORDER — ROCURONIUM BROMIDE 50 MG/5ML IV SOLN
INTRAVENOUS | Status: AC | PRN
  Administered 2020-10-31: 100 mg via INTRAVENOUS

## 2020-10-31 MED ORDER — SODIUM CHLORIDE 0.9 % IV SOLN
INTRAVENOUS | Status: AC | PRN
  Administered 2020-10-31: 1000 mL via INTRAVENOUS

## 2020-10-31 MED ORDER — SODIUM BICARBONATE 8.4 % IV SOLN
100.0000 meq | Freq: Once | INTRAVENOUS | Status: AC
  Administered 2020-10-31: 100 meq via INTRAVENOUS
  Filled 2020-10-31: qty 50

## 2020-10-31 MED ORDER — ORAL CARE MOUTH RINSE
15.0000 mL | OROMUCOSAL | Status: DC
  Administered 2020-10-31 – 2020-11-06 (×54): 15 mL via OROMUCOSAL

## 2020-10-31 MED ORDER — POTASSIUM CHLORIDE 10 MEQ/100ML IV SOLN
10.0000 meq | INTRAVENOUS | Status: AC
  Administered 2020-10-31 – 2020-11-01 (×5): 10 meq via INTRAVENOUS
  Filled 2020-10-31 (×6): qty 100

## 2020-10-31 MED ORDER — SODIUM BICARBONATE 8.4 % IV SOLN
INTRAVENOUS | Status: DC
  Filled 2020-10-31 (×14): qty 850

## 2020-10-31 MED ORDER — CHLORHEXIDINE GLUCONATE CLOTH 2 % EX PADS
6.0000 | MEDICATED_PAD | Freq: Every day | CUTANEOUS | Status: DC
  Administered 2020-11-01 – 2020-11-16 (×15): 6 via TOPICAL

## 2020-10-31 MED ORDER — MIDAZOLAM HCL 2 MG/2ML IJ SOLN
2.0000 mg | INTRAMUSCULAR | Status: DC | PRN
  Administered 2020-11-05: 2 mg via INTRAVENOUS
  Filled 2020-10-31: qty 2

## 2020-10-31 MED ORDER — VECURONIUM BROMIDE 10 MG IV SOLR: 0.1000 mg/kg | Freq: Once | INTRAVENOUS | Status: DC

## 2020-10-31 MED ORDER — FENTANYL CITRATE (PF) 100 MCG/2ML IJ SOLN
100.0000 ug | INTRAMUSCULAR | Status: AC | PRN
  Administered 2020-10-31 – 2020-11-06 (×3): 100 ug via INTRAVENOUS
  Filled 2020-10-31 (×3): qty 2

## 2020-10-31 MED ORDER — DOCUSATE SODIUM 100 MG PO CAPS: 100.0000 mg | ORAL_CAPSULE | Freq: Two times a day (BID) | ORAL | Status: DC | PRN

## 2020-10-31 MED ORDER — FENTANYL 2500MCG IN NS 250ML (10MCG/ML) PREMIX INFUSION
0.0000 ug/h | INTRAVENOUS | Status: DC
  Administered 2020-10-31 (×2): 100 ug/h via INTRAVENOUS
  Administered 2020-11-01 (×3): 350 ug/h via INTRAVENOUS
  Administered 2020-11-01: 300 ug/h via INTRAVENOUS
  Administered 2020-11-02 – 2020-11-03 (×5): 350 ug/h via INTRAVENOUS
  Administered 2020-11-03: 300 ug/h via INTRAVENOUS
  Administered 2020-11-04: 400 ug/h via INTRAVENOUS
  Administered 2020-11-04 (×2): 350 ug/h via INTRAVENOUS
  Administered 2020-11-05: 275 ug/h via INTRAVENOUS
  Filled 2020-10-31 (×15): qty 250

## 2020-10-31 MED ORDER — MIDAZOLAM 50MG/50ML (1MG/ML) PREMIX INFUSION
0.5000 mg/h | INTRAVENOUS | Status: DC
  Administered 2020-10-31: 10 mg/h via INTRAVENOUS
  Administered 2020-10-31: 4 mg/h via INTRAVENOUS
  Administered 2020-11-01 (×3): 7 mg/h via INTRAVENOUS
  Administered 2020-11-02: 4 mg/h via INTRAVENOUS
  Administered 2020-11-02: 7 mg/h via INTRAVENOUS
  Administered 2020-11-03: 2.5 mg/h via INTRAVENOUS
  Administered 2020-11-03: 1 mg/h via INTRAVENOUS
  Filled 2020-10-31 (×9): qty 50

## 2020-10-31 MED ORDER — NOREPINEPHRINE BITARTRATE 1 MG/ML IV SOLN
INTRAVENOUS | Status: AC | PRN
  Administered 2020-10-31: 15 ug/kg/min via INTRAVENOUS
  Administered 2020-10-31: 15 ug via INTRAVENOUS
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
## 2020-10-31 NOTE — ED Notes (Signed)
Patient transported to CT 

## 2020-10-31 NOTE — Progress Notes (Signed)
  Echocardiogram 2D Echocardiogram has been performed.  Terrence Henry 10/31/2020, 5:42 PM

## 2020-10-31 NOTE — Progress Notes (Signed)
eLink Physician-Brief Progress Note Patient Name: Terrence Henry DOB: 1981/08/02 MRN: [id number]   Date of Service  10/31/2020  HPI/Events of Note  Ventilator asynchrony - Now on both Fentnayl and Versed IV infusion. Request for NMB.  eICU Interventions  Plan: 1. Vecuronium 10 mg IV now.      Intervention Category Major Interventions: Respiratory failure - evaluation and management  Lenell Antu 10/31/2020, 9:44 PM
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
## 2020-10-31 NOTE — ED Triage Notes (Addendum)
Patient arrived by [facility] following probable OD. EMS found patient asystole in bathroom with open sharps with probable heroin use. EMS reports that family administered narcan prior to their arrival. CPR start 1414-ROSC 1434  CPR  IO left tib PEA  EPI x 5 V-fib-defib x 1 200J amio 300mg  Return of pulses NSR King airway cbg NOT obtained prior to 

## 2020-10-31 NOTE — Progress Notes (Signed)
eLink Physician-Brief Progress Note Patient Name: Terrence Henry DOB: July 27, 1981 MRN: [id number]   Date of Service  10/31/2020  HPI/Events of Note  Multiple issues: 1. ABG on 100%/PRVC 35/TV 580/P 5 = 7.285/30.4/57 and 2. Ventilator asynchrony - Request for NMB, however, patient is not sedated enough yet. Patient currently on a NaHCO3 IV infusion at 150 mL/hour.   eICU Interventions  Plan: 1. Increase PEEP to 8. 2. NaHOC3 100 meq IV now. 3. Increase ceiling on Fentanyl IV infusion to 400 mcg/hour. 4. Versed IV infusion. Titrate to RASS = 0 to -1.  5. Repeat ABG at 12 midnight.     Intervention Category Major Interventions: Acid-Base disturbance - evaluation and management;Respiratory failure - evaluation and management  Lenell Antu 10/31/2020, 8:56 PM

## 2020-10-31 NOTE — Procedures (Signed)
Arterial Catheter Insertion Procedure Note  Terrence Henry  [id number]  [birth year]  Date:10/31/20  Time:11:19 PM    Provider Performing: Lear Ng    Procedure: Insertion of Arterial Line (53202) without US guidance  Indication(s) Blood pressure monitoring and/or need for frequent ABGs  Consent Unable to obtain consent due to emergent nature of procedure.  Anesthesia None   Time Out Verified patient identification, verified procedure, site/side was marked, verified correct patient position, special equipment/implants available, medications/allergies/relevant history reviewed, required imaging and test results available.   Sterile Technique Maximal sterile technique including full sterile barrier drape, hand hygiene, sterile gown, sterile gloves, mask, hair covering, sterile ultrasound probe cover (if used).   Procedure Description Area of catheter insertion was cleaned with chlorhexidine and draped in sterile fashion. Without real-time ultrasound guidance an arterial catheter was placed into the left radial artery.  Appropriate arterial tracings confirmed on monitor.     Complications/Tolerance None; patient tolerated the procedure well.   EBL    Specimen(s) None

## 2020-10-31 NOTE — Progress Notes (Signed)
RT NOTE: RT transported patient on ventilator from trauma room C to CT and back to trauma room C with no complications. Vitals are stable. RT will continue to monitor.

## 2020-10-31 NOTE — ED Notes (Signed)
Bair hugger placed on pt.

## 2020-10-31 NOTE — Progress Notes (Signed)
eLink Physician-Brief Progress Note Patient Name: Terrence Henry DOB: 1981/06/15 MRN: [id number]   Date of Service  10/31/2020  HPI/Events of Note  Ventilator asynchrony.   eICU Interventions  Plan: 1. ABG STAT. 2. Fentanyl IV infusion. Titrate to RASS = 0 to -1.     Intervention Category Major Interventions: Other:;Respiratory failure - evaluation and management  Shanasia Ibrahim Dennard Nip 10/31/2020, 8:03 PM

## 2020-11-01 ENCOUNTER — Inpatient Hospital Stay (HOSPITAL_COMMUNITY): Payer: Self-pay

## 2020-11-01 DIAGNOSIS — Z452 Encounter for adjustment and management of vascular access device: Secondary | ICD-10-CM

## 2020-11-01 DIAGNOSIS — I469 Cardiac arrest, cause unspecified: Secondary | ICD-10-CM

## 2020-11-01 DIAGNOSIS — I21A1 Myocardial infarction type 2: Secondary | ICD-10-CM

## 2020-11-01 DIAGNOSIS — R579 Shock, unspecified: Secondary | ICD-10-CM

## 2020-11-01 LAB — BLOOD CULTURE ID PANEL (REFLEXED) - BCID2

## 2020-11-01 LAB — POCT I-STAT 7, (LYTES, BLD GAS, ICA,H+H)
Acid-base deficit: 6 mmol/L — ABNORMAL HIGH (ref 0.0–2.0)
Acid-base deficit: 7 mmol/L — ABNORMAL HIGH (ref 0.0–2.0)
Acid-base deficit: 8 mmol/L — ABNORMAL HIGH (ref 0.0–2.0)
Acid-base deficit: 9 mmol/L — ABNORMAL HIGH (ref 0.0–2.0)
Bicarbonate: 16 mmol/L — ABNORMAL LOW (ref 20.0–28.0)
Bicarbonate: 16.6 mmol/L — ABNORMAL LOW (ref 20.0–28.0)
Bicarbonate: 17.7 mmol/L — ABNORMAL LOW (ref 20.0–28.0)
Bicarbonate: 18.3 mmol/L — ABNORMAL LOW (ref 20.0–28.0)
Calcium, Ion: 1.04 mmol/L — ABNORMAL LOW (ref 1.15–1.40)
Calcium, Ion: 1.13 mmol/L — ABNORMAL LOW (ref 1.15–1.40)
Calcium, Ion: 1.14 mmol/L — ABNORMAL LOW (ref 1.15–1.40)
Calcium, Ion: 1.2 mmol/L (ref 1.15–1.40)
HCT: 55 % — ABNORMAL HIGH (ref 39.0–52.0)
HCT: 61 % — ABNORMAL HIGH (ref 39.0–52.0)
HCT: 62 % — ABNORMAL HIGH (ref 39.0–52.0)
HCT: 63 % — ABNORMAL HIGH (ref 39.0–52.0)
Hemoglobin: 18.7 g/dL — ABNORMAL HIGH (ref 13.0–17.0)
Hemoglobin: 20.7 g/dL — ABNORMAL HIGH (ref 13.0–17.0)
Hemoglobin: 21.1 g/dL (ref 13.0–17.0)
Hemoglobin: 21.4 g/dL (ref 13.0–17.0)
O2 Saturation: 100 %
O2 Saturation: 100 %
O2 Saturation: 100 %
O2 Saturation: 99 %
Patient temperature: 34.2
Patient temperature: 34.4
Patient temperature: 35.7
Patient temperature: 36.6
Potassium: 3.1 mmol/L — ABNORMAL LOW (ref 3.5–5.1)
Potassium: 3.2 mmol/L — ABNORMAL LOW (ref 3.5–5.1)
Potassium: 3.3 mmol/L — ABNORMAL LOW (ref 3.5–5.1)
Potassium: 4.1 mmol/L (ref 3.5–5.1)
Sodium: 141 mmol/L (ref 135–145)
Sodium: 142 mmol/L (ref 135–145)
Sodium: 142 mmol/L (ref 135–145)
Sodium: 142 mmol/L (ref 135–145)
TCO2: 17 mmol/L — ABNORMAL LOW (ref 22–32)
TCO2: 18 mmol/L — ABNORMAL LOW (ref 22–32)
TCO2: 19 mmol/L — ABNORMAL LOW (ref 22–32)
TCO2: 19 mmol/L — ABNORMAL LOW (ref 22–32)
pCO2 arterial: 27.9 mmHg — ABNORMAL LOW (ref 32.0–48.0)
pCO2 arterial: 28 mmHg — ABNORMAL LOW (ref 32.0–48.0)
pCO2 arterial: 29.6 mmHg — ABNORMAL LOW (ref 32.0–48.0)
pCO2 arterial: 36.1 mmHg (ref 32.0–48.0)
pH, Arterial: 7.311 — ABNORMAL LOW (ref 7.350–7.450)
pH, Arterial: 7.351 (ref 7.350–7.450)
pH, Arterial: 7.371 (ref 7.350–7.450)
pH, Arterial: 7.378 (ref 7.350–7.450)
pO2, Arterial: 149 mmHg — ABNORMAL HIGH (ref 83.0–108.0)
pO2, Arterial: 333 mmHg — ABNORMAL HIGH (ref 83.0–108.0)
pO2, Arterial: 351 mmHg — ABNORMAL HIGH (ref 83.0–108.0)
pO2, Arterial: 415 mmHg — ABNORMAL HIGH (ref 83.0–108.0)

## 2020-11-01 LAB — BASIC METABOLIC PANEL
Anion gap: 12 (ref 5–15)
Anion gap: 14 (ref 5–15)
BUN: 23 mg/dL — ABNORMAL HIGH (ref 6–20)
BUN: 28 mg/dL — ABNORMAL HIGH (ref 6–20)
CO2: 14 mmol/L — ABNORMAL LOW (ref 22–32)
CO2: 16 mmol/L — ABNORMAL LOW (ref 22–32)
Calcium: 7.3 mg/dL — ABNORMAL LOW (ref 8.9–10.3)
Calcium: 7.1 mg/dL — ABNORMAL LOW (ref 8.9–10.3)
Chloride: 114 mmol/L — ABNORMAL HIGH (ref 98–111)
Chloride: 109 mmol/L (ref 98–111)
Creatinine, Ser: 1.94 mg/dL — ABNORMAL HIGH (ref 0.61–1.24)
Creatinine, Ser: 2.53 mg/dL — ABNORMAL HIGH (ref 0.61–1.24)
GFR, Estimated: 45 mL/min — ABNORMAL LOW (ref >60)
GFR, Estimated: 32 mL/min — ABNORMAL LOW (ref >60)
Glucose, Bld: 174 mg/dL — ABNORMAL HIGH (ref 70–99)
Glucose, Bld: 137 mg/dL — ABNORMAL HIGH (ref 70–99)
Potassium: 2.9 mmol/L — ABNORMAL LOW (ref 3.5–5.1)
Potassium: 4.1 mmol/L (ref 3.5–5.1)
Sodium: 140 mmol/L (ref 135–145)
Sodium: 139 mmol/L (ref 135–145)

## 2020-11-01 LAB — CBC
HCT: 60.7 % — ABNORMAL HIGH (ref 39.0–52.0)
Hemoglobin: 20.5 g/dL — ABNORMAL HIGH (ref 13.0–17.0)
MCH: 30.3 pg (ref 26.0–34.0)
MCHC: 33.8 g/dL (ref 30.0–36.0)
MCV: 89.7 fL (ref 80.0–100.0)
Platelets: 267 10*3/uL (ref 150–400)
RBC: 6.77 MIL/uL — ABNORMAL HIGH (ref 4.22–5.81)
RDW: 13.1 % (ref 11.5–15.5)
WBC: 21.1 10*3/uL — ABNORMAL HIGH (ref 4.0–10.5)
nRBC: 0 % (ref 0.0–0.2)

## 2020-11-01 LAB — RAPID URINE DRUG SCREEN, HOSP PERFORMED
Amphetamines: POSITIVE — AB
Barbiturates: NOT DETECTED
Benzodiazepines: NOT DETECTED
Cocaine: NOT DETECTED
Opiates: NOT DETECTED
Tetrahydrocannabinol: NOT DETECTED

## 2020-11-01 LAB — PATHOLOGIST SMEAR REVIEW

## 2020-11-01 LAB — GLUCOSE, CAPILLARY
Glucose-Capillary: 136 mg/dL — ABNORMAL HIGH (ref 70–99)
Glucose-Capillary: 160 mg/dL — ABNORMAL HIGH (ref 70–99)
Glucose-Capillary: 164 mg/dL — ABNORMAL HIGH (ref 70–99)
Glucose-Capillary: 190 mg/dL — ABNORMAL HIGH (ref 70–99)
Glucose-Capillary: 56 mg/dL — ABNORMAL LOW (ref 70–99)
Glucose-Capillary: 74 mg/dL (ref 70–99)
Glucose-Capillary: 89 mg/dL (ref 70–99)

## 2020-11-01 LAB — MAGNESIUM: Magnesium: 1.5 mg/dL — ABNORMAL LOW (ref 1.7–2.4)

## 2020-11-01 LAB — PHOSPHORUS: Phosphorus: 1.3 mg/dL — ABNORMAL LOW (ref 2.5–4.6)

## 2020-11-01 LAB — MRSA PCR SCREENING: MRSA by PCR: NEGATIVE

## 2020-11-01 MED ORDER — DEXTROSE 50 % IV SOLN
INTRAVENOUS | Status: AC
  Filled 2020-11-01: qty 50

## 2020-11-01 MED ORDER — SODIUM CHLORIDE 0.9 % IV BOLUS
1000.0000 mL | Freq: Once | INTRAVENOUS | Status: AC
  Administered 2020-11-01: 1000 mL via INTRAVENOUS

## 2020-11-01 MED ORDER — VITAL 1.5 CAL PO LIQD
1000.0000 mL | ORAL | Status: DC
  Administered 2020-11-01 – 2020-11-03 (×4): 1000 mL
  Filled 2020-11-01 (×3): qty 1000

## 2020-11-01 MED ORDER — POTASSIUM CHLORIDE 20 MEQ PO PACK
40.0000 meq | PACK | Freq: Once | ORAL | Status: AC
  Administered 2020-11-01: 40 meq
  Filled 2020-11-01: qty 2

## 2020-11-01 MED ORDER — PANTOPRAZOLE SODIUM 40 MG IV SOLR
40.0000 mg | INTRAVENOUS | Status: DC
  Administered 2020-11-01 – 2020-11-06 (×6): 40 mg via INTRAVENOUS
  Filled 2020-11-01 (×6): qty 40

## 2020-11-01 MED ORDER — POTASSIUM CHLORIDE 10 MEQ/50ML IV SOLN
10.0000 meq | INTRAVENOUS | Status: AC
  Administered 2020-11-01 (×2): 10 meq via INTRAVENOUS
  Filled 2020-11-01 (×2): qty 50

## 2020-11-01 MED ORDER — SODIUM CHLORIDE 0.9 % IV SOLN
INTRAVENOUS | Status: DC
  Administered 2020-11-01 (×2): 1000 mL via INTRAVENOUS

## 2020-11-01 MED ORDER — POTASSIUM CHLORIDE 10 MEQ/50ML IV SOLN: 10.0000 meq | INTRAVENOUS | Status: DC

## 2020-11-01 MED ORDER — SODIUM CHLORIDE 0.9% FLUSH: 10.0000 mL | INTRAVENOUS | Status: DC | PRN

## 2020-11-01 MED ORDER — POTASSIUM PHOSPHATES 15 MMOLE/5ML IV SOLN
30.0000 mmol | Freq: Once | INTRAVENOUS | Status: AC
  Administered 2020-11-01: 30 mmol via INTRAVENOUS
  Filled 2020-11-01: qty 10

## 2020-11-01 MED ORDER — PROSOURCE TF PO LIQD
90.0000 mL | Freq: Three times a day (TID) | ORAL | Status: DC
  Administered 2020-11-01 – 2020-11-08 (×14): 90 mL
  Filled 2020-11-01 (×15): qty 90

## 2020-11-01 MED ORDER — MAGNESIUM SULFATE 2 GM/50ML IV SOLN
2.0000 g | Freq: Once | INTRAVENOUS | Status: AC
  Administered 2020-11-01: 2 g via INTRAVENOUS
  Filled 2020-11-01: qty 50

## 2020-11-01 MED ORDER — DEXTROSE 50 % IV SOLN
12.5000 g | INTRAVENOUS | Status: AC
  Administered 2020-11-01: 12.5 g via INTRAVENOUS

## 2020-11-01 MED ORDER — NOREPINEPHRINE 4 MG/250ML-% IV SOLN
INTRAVENOUS | Status: AC
  Filled 2020-11-01: qty 250

## 2020-11-01 MED ORDER — PANTOPRAZOLE SODIUM 40 MG PO PACK
40.0000 mg | PACK | Freq: Every day | ORAL | Status: DC
  Filled 2020-11-01: qty 20

## 2020-11-01 MED ORDER — NOREPINEPHRINE 4 MG/250ML-% IV SOLN
0.0000 ug/min | INTRAVENOUS | Status: DC
  Administered 2020-11-01: 25 ug/min via INTRAVENOUS
  Administered 2020-11-01: 10 ug/min via INTRAVENOUS
  Administered 2020-11-01 (×2): 25 ug/min via INTRAVENOUS
  Administered 2020-11-01: 5 ug/min via INTRAVENOUS
  Filled 2020-11-01 (×4): qty 250

## 2020-11-01 NOTE — Progress Notes (Signed)
CDS called, referral number [phone]

## 2020-11-01 NOTE — Consult Note (Signed)
Subjective:  Patient is presently intubated, therefore history is obtained through chart review.   39 year old male with a history of polysubstance abuse who presented to the [facility] ED via EMS. He was found by family down in the bathroom at home reportedly with needles around him with unknown down time.  EMS arrived on scene patient noted to be in asystole he was given epinephrine x5, followed by an episode of V. fib and PEA arrest, ROSC was achieved after 20 minutes of CPR.  Patient was intubated and work-up in ED showed pH of 6.9 and lactate of 10.  EKG demonstrated ST elevations concerning for myocardial infarction.  Initial troponins were mildly elevated, with third of 2909.  Our cardiology team was consulted at this time and recommend stat echocardiogram, which noted global hypokinesis of LV, without regional wall motion abnormalities.   Unable to complete ROS, as patient is intubated.   Objective:  Vital Signs in the last 24 hours: Temp:  [88.5 F (31.4 C)-100.2 F (37.9 C)] 93.6 F (34.2 C) (11/22 0400) Pulse Rate:  [69-120] 120 (11/22 0600) Resp:  [14-43] 35 (11/22 0700) BP: (47-199)/(32-178) 195/101 (11/22 0700) SpO2:  [87 %-100 %] 100 % (11/22 0600) Arterial Line BP: (76-89)/(55-72) 78/55 (11/22 0700) FiO2 (%):  [40 %-100 %] 100 % (11/22 0141) Weight:  [100 kg-106.7 kg] 106.7 kg (11/22 0400)  Intake/Output from previous day: 11/21 0701 - 11/22 0700 In: 5153.8 [I.V.:3449.3; IV Piggyback:1704.5] Out: 15 [Urine:15]  Physical Exam Vitals reviewed.  Constitutional:      Comments: Overweight male. Intubated.   HENT:     Head: Normocephalic and atraumatic.  Eyes:     Comments: Pupils non-dialted  Neck:     Vascular: No carotid bruit or JVD.  Cardiovascular:     Rate and Rhythm: Normal rate and regular rhythm.     Pulses: Intact distal pulses.     Heart sounds: S1 normal and S2 normal. No murmur heard.  No gallop.      Comments: No JVD. No leg edema.  Pulmonary:      Breath sounds: No wheezing, rhonchi or rales.     Comments: Intubated Abdominal:     General: Bowel sounds are normal. There is no distension.  Musculoskeletal:     Right lower leg: No edema.     Left lower leg: No edema.  Skin:    General: Skin is dry.     Findings: No erythema or rash.  Neurological:     Mental Status: He is alert.     Comments: No response to verbal stimuli.     Lab Results: BMP Recent Labs    03/11/20 1745 03/11/20 1745 10/31/20 1501 10/31/20 1501 10/31/20 1506 10/31/20 1559 11/01/20 0414 11/01/20 0417 11/01/20 0429  NA 138   < > 141   < > 142  142   < > 140 142 142  K 4.6   < > 4.0   < > 3.8  3.8   < > 2.9* 3.1* 3.2*  CL 104   < > 107  --  111  --  114*  --   --   CO2 26  --  16*  --   --   --  14*  --   --   GLUCOSE 153*   < > 128*  --  119*  --  174*  --   --   BUN 9   < > 12  --  16  --  23*  --   --  CREATININE 0.80   < > 1.13  --  0.90  --  1.94*  --   --   CALCIUM 9.1  --  8.4*  --   --   --  7.3*  --   --   GFRNONAA >60  --  >60  --   --   --  45*  --   --   GFRAA >60  --   --   --   --   --   --   --   --    < > = values in this interval not displayed.    CBC Recent Labs  Lab 10/31/20 1501 10/31/20 1506 11/01/20 0414 11/01/20 0417 11/01/20 0429  WBC 20.2*   < > 21.1*  --   --   RBC 5.85*   < > 6.77*  --   --   HGB 17.7*   < > 20.5*   < > 20.7*  HCT 57.8*   < > 60.7*   < > 61.0*  PLT 223   < > 267  --   --   MCV 98.8   < > 89.7  --   --   MCH 30.3   < > 30.3  --   --   MCHC 30.6   < > 33.8  --   --   RDW 12.4   < > 13.1  --   --   LYMPHSABS 9.0*  --   --   --   --   MONOABS 0.4  --   --   --   --   EOSABS 0.1  --   --   --   --   BASOSABS 0.1  --   --   --   --    < > = values in this interval not displayed.    HEMOGLOBIN A1C No results found for: HGBA1C, MPG  Cardiac Panel (last 3 results) Recent Labs    03/11/20 1840  CKTOTAL 268    BNP (last 3 results) No results for input(s): BNP in the last 8760  hours.  TSH No results for input(s): TSH in the last 8760 hours.  Lipid Panel  No results found for: CHOL, TRIG, HDL, CHOLHDL, VLDL, LDLCALC, LDLDIRECT   Hepatic Function Panel Recent Labs    03/11/20 1745 03/11/20 2128 04/23/20 1140 10/31/20 1501  PROT 7.8  --  7.3 5.0*  ALBUMIN 3.3*  --  4.0 2.7*  AST 678*  --  35 42*  ALT 1,195*  --  53 26  ALKPHOS 107  --  76 53  BILITOT 7.4*  --  1.0 0.7  BILIDIR  --  3.6* 0.5*  --     Imaging: CT Head Wo Contrast  Result Date: 10/31/2020 CLINICAL DATA:  Found unresponsive. EXAM: CT HEAD WITHOUT CONTRAST TECHNIQUE: Contiguous axial images were obtained from the base of the skull through the vertex without intravenous contrast. COMPARISON:  09/06/2008 FINDINGS: Brain: No acute intracranial abnormality. Specifically, no hemorrhage, hydrocephalus, mass lesion, acute infarction, or significant intracranial injury. Vascular: No hyperdense vessel or unexpected calcification. Skull: No acute calvarial abnormality. Sinuses/Orbits: Mucosal thickening in the ethmoid air cells. No air-fluid levels. Other: None IMPRESSION: No intracranial abnormality. Electronically Signed   By: Charlett Nose M.D.   On: 10/31/2020 17:03   DG Chest Portable 1 View  Result Date: 10/31/2020 CLINICAL DATA:  Post intubation EXAM: PORTABLE CHEST 1 VIEW COMPARISON:  02/26/2019 FINDINGS: Endotracheal tube tip about 2.5  cm superior to carina. Esophageal tube tip overlies the proximal stomach. No focal opacity or pleural effusion. Stable cardiomediastinal silhouette. No pneumothorax. IMPRESSION: Endotracheal tube tip about 2.5 cm superior to carina. Esophageal tube tip overlies the proximal stomach. Grossly clear lung fields. Electronically Signed   By: Jasmine Pang M.D.   On: 10/31/2020 15:53   ECHOCARDIOGRAM COMPLETE  Result Date: 10/31/2020    ECHOCARDIOGRAM REPORT   Patient Name:   Terrence Henry Date of Exam: 10/31/2020 Medical Rec #:  [id number]       Height:       75.0  in Accession #:    [id number]      Weight:       220.5 lb Date of Birth:  1981/09/14       BSA:          2.287 m Patient Age:    39 years        BP:           142/114 mmHg Patient Gender: M               HR:           69 bpm. Exam Location:  Inpatient Procedure: 2D Echo STAT ECHO Indications:    cardiac arrest  History:        Patient has no prior history of Echocardiogram examinations.                 Risk Factors:substance abuse.  Sonographer:    Delcie Roch Referring Phys: [id number] PAULA B SIMPSON IMPRESSIONS  1. Left ventricular ejection fraction, by estimation, is 40 to 45%. The left ventricle has mildly decreased function. The left ventricle demonstrates global hypokinesis. There is mild left ventricular hypertrophy. Left ventricular diastolic parameters are consistent with Grade I diastolic dysfunction (impaired relaxation).  2. Right ventricular systolic function is normal. The right ventricular size is normal.  3. The mitral valve is normal in structure. No evidence of mitral valve regurgitation. No evidence of mitral stenosis.  4. The aortic valve was not well visualized. Aortic valve regurgitation is not visualized. No aortic stenosis is present.  5. The inferior vena cava is normal in size with greater than 50% respiratory variability, suggesting right atrial pressure of 3 mmHg. FINDINGS  Left Ventricle: Left ventricular ejection fraction, by estimation, is 40 to 45%. The left ventricle has mildly decreased function. The left ventricle demonstrates global hypokinesis. The left ventricular internal cavity size was normal in size. There is  mild left ventricular hypertrophy. Abnormal (paradoxical) septal motion, consistent with left bundle branch block. Left ventricular diastolic parameters are consistent with Grade I diastolic dysfunction (impaired relaxation). Right Ventricle: The right ventricular size is normal. No increase in right ventricular wall thickness. Right ventricular systolic function is  normal. Left Atrium: Left atrial size was normal in size. Right Atrium: Right atrial size was normal in size. Pericardium: There is no evidence of pericardial effusion. Mitral Valve: The mitral valve is normal in structure. No evidence of mitral valve regurgitation. No evidence of mitral valve stenosis. Tricuspid Valve: The tricuspid valve is normal in structure. Tricuspid valve regurgitation is not demonstrated. No evidence of tricuspid stenosis. Aortic Valve: The aortic valve was not well visualized. Aortic valve regurgitation is not visualized. No aortic stenosis is present. Pulmonic Valve: The pulmonic valve was normal in structure. Pulmonic valve regurgitation is not visualized. No evidence of pulmonic stenosis. Aorta: The aortic root is normal in size and structure. Venous: The inferior vena  cava is normal in size with greater than 50% respiratory variability, suggesting right atrial pressure of 3 mmHg. IAS/Shunts: No atrial level shunt detected by color flow Doppler.  LEFT VENTRICLE PLAX 2D LVIDd:         4.10 cm     Diastology LVIDs:         3.10 cm     LV e' medial:    6.64 cm/s LV PW:         1.40 cm     LV E/e' medial:  5.9 LV IVS:        1.10 cm     LV e' lateral:   10.00 cm/s LVOT diam:     2.00 cm     LV E/e' lateral: 3.9 LV SV:         31 LV SV Index:   14 LVOT Area:     3.14 cm  LV Volumes (MOD) LV vol d, MOD A2C: 46.7 ml LV vol d, MOD A4C: 62.1 ml LV vol s, MOD A2C: 30.7 ml LV vol s, MOD A4C: 42.2 ml LV SV MOD A2C:     16.0 ml LV SV MOD A4C:     62.1 ml LV SV MOD BP:      18.2 ml RIGHT VENTRICLE             IVC RV S prime:     12.60 cm/s  IVC diam: 1.70 cm TAPSE (M-mode): 1.3 cm LEFT ATRIUM           Index       RIGHT ATRIUM          Index LA diam:      3.30 cm 1.44 cm/m  RA Area:     9.91 cm LA Vol (A4C): 27.7 ml 12.11 ml/m RA Volume:   18.70 ml 8.18 ml/m  AORTIC VALVE LVOT Vmax:   71.10 cm/s LVOT Vmean:  45.700 cm/s LVOT VTI:    0.099 m  AORTA Ao Root diam: 3.20 cm Ao Asc diam:  3.20 cm  MITRAL VALVE MV Area (PHT): 3.03 cm    SHUNTS MV Decel Time: 250 msec    Systemic VTI:  0.10 m MV E velocity: 39.00 cm/s  Systemic Diam: 2.00 cm MV A velocity: 41.60 cm/s MV E/A ratio:  0.94 Donato Schultz MD Electronically signed by Donato Schultz MD Signature Date/Time: 10/31/2020/6:02:17 PM    Final     Cardiac Studies:  EKG: 10/31/2020: Sinus rhythm at a rate of 75 bpm with single PVC. IVCD. Poor R wave progression, cannot exclude anteroseptal infarct old. ST T wave changes, cannot exclude infarct.  11/01/2020: Sinus tachycardia at a rate of 105 bpm, left atrial enlargement. Normal axis. Cannot exclude anteroseptal infarct age indeterminate.   Echocardiogram 10/31/2020: 1. Left ventricular ejection fraction, by estimation, is 40 to 45%. The left ventricle has mildly decreased function. The left ventricle demonstrates global hypokinesis. There is mild left ventricular hypertrophy. Left ventricular diastolic parameters are consistent with Grade I diastolic dysfunction (impaired relaxation).  2. Right ventricular systolic function is normal. The right ventricular size is normal.  3. The mitral valve is normal in structure. No evidence of mitral valve regurgitation. No evidence of mitral stenosis.  4. The aortic valve was not well visualized. Aortic valve regurgitation is not visualized. No aortic stenosis is present.  5. The inferior vena cava is normal in size with greater than 50% respiratory variability, suggesting right atrial pressure of 3 mmHg.  Assessment & Recommendations:  Post-Cardiac Arrest:  39 year old male post cardiac arrest, likely secondary to drug overdose in view of history of polysubstance abuse and as he was found with needles present.  Initial EKG demonstrated IVCD with ST changes suggestive of myocardial infarction.  However patient with unfavorable factors for emergent cath including pH < 7.2, lactate lactate >10, unwitnessed arrest with unknown downtime, as well as  presently guarded prognosis. Suspect cardiac arrest is not related to primary cardiac etiology but rather drug overdose.  Elevated troponin likely secondary to type II MI from cardiac arrest.  Echo did demonstrate left ventricular hypokinesis, but with no regional wall. Initial EKG changes potentially suggestive of STEMI were not prominent in repeat EKG, coupled with no reginal wall abnormalities on echo suggest etiology if not myocardial infarction. At this time do not recommend emergent cath, but could consider cardiac catheterization if and when patient recovers from present critical illness.   Mildly reduced LVEF:  See above recommendations.   Elevated Troponin:  Likely secondary to type II MI due to cardiac arrest.   Continue supportive care per critical care team.   Patient was seen in collaboration with Dr. Rosemary HolmsPatwardhan. He also reviewed patient's chart and examined the patient. Dr. Rosemary HolmsPatwardhan is in agreement of the plan.     Rayford HalstedCeleste C Braelin Brosch, PA-C 11/01/2020, 8:57 AM Office: [phone]

## 2020-11-01 NOTE — Progress Notes (Signed)
eLink Physician-Brief Progress Note Patient Name: Terrence Henry DOB: [birth year] MRN: [id number]   Date of Service  11/01/2020  HPI/Events of Note  Hypotension and oliguria - Bladder scan pending. BP = 81/71 by A-line. Hgb = 21.4.   eICU Interventions  Plan: 1. Back off on sedation until BP improved.  2. Bolus with 0.9 NaCl 1 liter IV over 1 hour now.      Intervention Category Major Interventions: Hypotension - evaluation and management  Mykira Hofmeister Eugene 11/01/2020, 12:26 AM

## 2020-11-01 NOTE — Progress Notes (Signed)
NAME:  Terrence Henry, MRN:  [id number], DOB:  [birth year], LOS: 1 ADMISSION DATE:  10/31/2020, CONSULTATION DATE: 10/31/2020 REFERRING MD: Dr. Deanna Artis, CHIEF COMPLAINT: Cardiac arrest  Brief History   39 year old male with history of polysubstance abuse presented after he was found down in the bathroom with the needles around, was given Narcan by family before EMS arrived.  Unknown downtime.  ROSC achieved after 20 minutes of CPR in the ED  History of present illness   39 year old male with polysubstance abuse who presented in the emergency department cardiac arrest. Patient is intubated so history is taken from emergency department note. Per EMS report patient was found in the bathroom with needles around him, family gave him Narcan before EMS arrived, CPR was initiated per ACLS protocol.  Initial rhythm was asystole, he was given epinephrine x5, then he had V. fib rhythm and followed by PEA arrest, ROSC was achieved after 20 minutes of CPR.  He was intubated.  Postintubation ABG showed pH of 6.9. His EKG showed left bundle branch block, on bedside echo patient had septal wall motion abnormalities.  Cardiology was called they recommend doing stat echocardiogram.  Past Medical History  Polysubstance abuse Inguinal hernia  Significant Hospital Events     Consults:  Cardiology PCCM  Procedures:  11/21 ETT  Significant Diagnostic Tests:  11/21 CT head: neg acute  11/21 2D echo >>> EF 40-45%, global hypokinesis, grade 1 diastolic dysfunction   Micro Data:    Antimicrobials:     Interim history/subjective:  Hypotensive, oliguric, frequent ectopy per RN  Objective   Blood pressure (!) 78/48, pulse (!) 105, temperature 98.8 F (37.1 C), resp. rate (!) 36, height 6\' 3"  (1.905 m), weight 106.7 kg, SpO2 100 %.    Vent Mode: PRVC FiO2 (%):  [40 %-100 %] 80 % Set Rate:  [30 bmp-35 bmp] 35 bmp Vt Set:  [580 mL-670 mL] 580 mL PEEP:  [5 cmH20-8 cmH20] 8 cmH20 Plateau  Pressure:  [14 cmH20-23 cmH20] 14 cmH20   Intake/Output Summary (Last 24 hours) at 11/01/2020 11/03/2020 Last data filed at 11/01/2020 0700 Gross per 24 hour  Intake 5153.75 ml  Output 15 ml  Net 5138.75 ml   Filed Weights   10/31/20 1504 11/01/20 0400  Weight: 100 kg 106.7 kg    Examination: Physical exam: General: Young Caucasian male, acutely ill appearing  HEENT: mm moist, no JVD, ETT Neuro: sedated on vent, RASS -4, breathing over vent rate, does not open eyes or follow commands, does not grimace to pain, pupils 23mm non reactive. No gag. Chest: Coarse breath sounds, no wheezes or rhonchi Heart: Regular rate and rhythm, no murmurs or gallops Abdomen: Soft, nontender, nondistended, bowel sounds present Skin: No rash  Resolved Hospital Problem list     Assessment & Plan:   Asystolic cardiac arrest - unknown downtime prior to CPR, 3m CPR prior to ROSC. Suspect r/t drug OD.  Lactic acidosis Left bundle branch block PLAN -  Normothermia  Replete Mg  Trend lactate   Cardiogenic shock - post arrest  PLAN -  Pressors as needed to keep MAP >65  Gentle volume  Echo pending  Place CVL, monitor CVP  Acute hypoxic/hypercapnic respiratory failure PLAN -  Vent support - 8cc/kg  F/u CXR  F/u ABG   AKI  PLAN -  Gentle volume  Trend Scr  Monitor uop  BP support  Replace electrolytes   Mixed respiratory/metabolic acidosis  PLAN -  Vent changes  Trend chem  F/u abg  Trend lactate  Continue bicarb gtt    AMS - post arrest with prolonged downtime  PLAN -  Normothermia protocol as above  PAD protocol for sedation - has not had any purposeful movement, sedation added for vent synchrony  EEG    Best practice:  Diet: NPO Pain/Anxiety/Delirium protocol (if indicated): fent/versed gtt VAP protocol (if indicated): Yes DVT prophylaxis: Lovenox GI prophylaxis: Pepcid Glucose control: N/A Mobility: Bedrest Code Status: Full code Family Communication: We will  update patient's family  Disposition: ICU  Labs   CBC: Recent Labs  Lab 10/31/20 1501 10/31/20 1506 10/31/20 2028 11/01/20 0003 11/01/20 0414 11/01/20 0417 11/01/20 0429  WBC 20.2*  --   --   --  21.1*  --   --   NEUTROABS 9.3*  --   --   --   --   --   --   HGB 17.7*   < > 20.1* 21.4* 20.5* 21.1* 20.7*  HCT 57.8*   < > 59.0* 63.0* 60.7* 62.0* 61.0*  MCV 98.8  --   --   --  89.7  --   --   PLT 223  --   --   --  267  --   --    < > = values in this interval not displayed.    Basic Metabolic Panel: Recent Labs  Lab 10/31/20 1501 10/31/20 1501 10/31/20 1506 10/31/20 1559 10/31/20 2028 11/01/20 0003 11/01/20 0414 11/01/20 0417 11/01/20 0429  NA 141   < > 142  142   < > 138 141 140 142 142  K 4.0   < > 3.8  3.8   < > 3.7 3.3* 2.9* 3.1* 3.2*  CL 107  --  111  --   --   --  114*  --   --   CO2 16*  --   --   --   --   --  14*  --   --   GLUCOSE 128*  --  119*  --   --   --  174*  --   --   BUN 12  --  16  --   --   --  23*  --   --   CREATININE 1.13  --  0.90  --   --   --  1.94*  --   --   CALCIUM 8.4*  --   --   --   --   --  7.3*  --   --   MG  --   --   --   --   --   --  1.5*  --   --   PHOS  --   --   --   --   --   --  1.3*  --   --    < > = values in this interval not displayed.   GFR: Estimated Creatinine Clearance: 68.2 mL/min (A) (by C-G formula based on SCr of 1.94 mg/dL (H)). Recent Labs  Lab 10/31/20 1501 10/31/20 1808 11/01/20 0414  WBC 20.2*  --  21.1*  LATICACIDVEN 10.1* 6.1*  --     Liver Function Tests: Recent Labs  Lab 10/31/20 1501  AST 42*  ALT 26  ALKPHOS 53  BILITOT 0.7  PROT 5.0*  ALBUMIN 2.7*   No results for input(s): LIPASE, AMYLASE in the last 168 hours. No results for input(s): AMMONIA in the last 168 hours.  ABG  Component Value Date/Time   PHART 7.371 11/01/2020 0429   PCO2ART 27.9 (L) 11/01/2020 0429   PO2ART 351 (H) 11/01/2020 0429   HCO3 16.6 (L) 11/01/2020 0429   TCO2 18 (L) 11/01/2020 0429   ACIDBASEDEF  8.0 (H) 11/01/2020 0429   O2SAT 100.0 11/01/2020 0429     Coagulation Profile: No results for input(s): INR, PROTIME in the last 168 hours.  Cardiac Enzymes: No results for input(s): CKTOTAL, CKMB, CKMBINDEX, TROPONINI in the last 168 hours.  HbA1C: No results found for: HGBA1C  CBG: Recent Labs  Lab 10/31/20 1919 10/31/20 2359 11/01/20 0412 11/01/20 0728 11/01/20 0802  GLUCAP 130* 160* 164* 56* 190*     Total critical care time: 45 minutes  Dirk Dress, NP Pulmonary/Critical Care Medicine  11/01/2020  9:42 AM

## 2020-11-01 NOTE — Progress Notes (Signed)
eLink Physician-Brief Progress Note Patient Name: Terrence Henry DOB: 11-Sep-1981 MRN: [id number]   Date of Service  11/01/2020  HPI/Events of Note  Frequent liquid stools - Request for Flexiseal.   eICU Interventions  Will place Flexiseal.      Intervention Category Major Interventions: Other:  Normagene Harvie Dennard Nip 11/01/2020, 1:01 AM

## 2020-11-01 NOTE — Progress Notes (Signed)
PHARMACY - PHYSICIAN COMMUNICATION CRITICAL VALUE ALERT - BLOOD CULTURE IDENTIFICATION (BCID)  Terrence Henry is an 39 y.o. male who presented to [facility] on 10/31/2020  Assessment:  45 yom presenting s/p cardiac arrest. 1 of 4 BCx bottles growing coag negative staph (no resistance detected).  Name of physician (or Provider) Contacted: Arsenio Loader, S  Current antibiotics: none  Changes to prescribed antibiotics recommended:  None - likely contaminant  Results for orders placed or performed during the hospital encounter of 10/31/20  Blood Culture ID Panel (Reflexed) (Collected: 10/31/2020  3:39 PM)  Result Value Ref Range   Enterococcus faecalis NOT DETECTED NOT DETECTED   Enterococcus Faecium NOT DETECTED NOT DETECTED   Listeria monocytogenes NOT DETECTED NOT DETECTED   Staphylococcus species DETECTED (A) NOT DETECTED   Staphylococcus aureus (BCID) NOT DETECTED NOT DETECTED   Staphylococcus epidermidis NOT DETECTED NOT DETECTED   Staphylococcus lugdunensis NOT DETECTED NOT DETECTED   Streptococcus species NOT DETECTED NOT DETECTED   Streptococcus agalactiae NOT DETECTED NOT DETECTED   Streptococcus pneumoniae NOT DETECTED NOT DETECTED   Streptococcus pyogenes NOT DETECTED NOT DETECTED   A.calcoaceticus-baumannii NOT DETECTED NOT DETECTED   Bacteroides fragilis NOT DETECTED NOT DETECTED   Enterobacterales NOT DETECTED NOT DETECTED   Enterobacter cloacae complex NOT DETECTED NOT DETECTED   Escherichia coli NOT DETECTED NOT DETECTED   Klebsiella aerogenes NOT DETECTED NOT DETECTED   Klebsiella oxytoca NOT DETECTED NOT DETECTED   Klebsiella pneumoniae NOT DETECTED NOT DETECTED   Proteus species NOT DETECTED NOT DETECTED   Salmonella species NOT DETECTED NOT DETECTED   Serratia marcescens NOT DETECTED NOT DETECTED   Haemophilus influenzae NOT DETECTED NOT DETECTED   Neisseria meningitidis NOT DETECTED NOT DETECTED   Pseudomonas aeruginosa NOT DETECTED NOT DETECTED    Stenotrophomonas maltophilia NOT DETECTED NOT DETECTED   Candida albicans NOT DETECTED NOT DETECTED   Candida auris NOT DETECTED NOT DETECTED   Candida glabrata NOT DETECTED NOT DETECTED   Candida krusei NOT DETECTED NOT DETECTED   Candida parapsilosis NOT DETECTED NOT DETECTED   Candida tropicalis NOT DETECTED NOT DETECTED   Cryptococcus neoformans/gattii NOT DETECTED NOT DETECTED    Terrence Henry 11/01/2020  9:38 PM

## 2020-11-01 NOTE — Progress Notes (Signed)
Portable EEG completed, results pending. 

## 2020-11-01 NOTE — Progress Notes (Signed)
   11/01/20 0358  Clinical Encounter Type  Visited With Health care provider  Visit Type Code  Referral From Nurse  Consult/Referral To Chaplain  The chaplain responded to the code cool page. The patient is unavailable. The chaplain spoke with the nurse and if further chaplain services are needed the chaplain will be paged.

## 2020-11-01 NOTE — Progress Notes (Signed)
eLink Physician-Brief Progress Note Patient Name: Terrence Henry DOB: Dec 23, 1980 MRN: [id number]   Date of Service  11/01/2020  HPI/Events of Note  Hypokalemia - K+ = 2.9 and Creatinine = 1.92.   eICU Interventions  Will replace K+.     Intervention Category Major Interventions: Electrolyte abnormality - evaluation and management  Ivann Trimarco Eugene 11/01/2020, 5:24 AM

## 2020-11-01 NOTE — Procedures (Signed)
Central Venous Catheter Insertion Procedure Note  Terrence Henry  [id number]  1981-07-27  Date:11/01/20  Time:9:44 AM   Provider Performing:Antoney Biven   Procedure: Insertion of Non-tunneled Central Venous Catheter(36556) with US guidance (73403)   Indication(s) Medication administration  Consent Unable to obtain consent due to emergent nature of procedure.  Anesthesia Topical only with 1% lidocaine   Timeout Verified patient identification, verified procedure, site/side was marked, verified correct patient position, special equipment/implants available, medications/allergies/relevant history reviewed, required imaging and test results available.  Sterile Technique Maximal sterile technique including full sterile barrier drape, hand hygiene, sterile gown, sterile gloves, mask, hair covering, sterile ultrasound probe cover (if used).  Procedure Description Area of catheter insertion was cleaned with chlorhexidine and draped in sterile fashion.  With real-time ultrasound guidance a HD catheter was placed into the left internal jugular vein. Nonpulsatile blood flow and easy flushing noted in all ports.  The catheter was sutured in place and sterile dressing applied.  Complications/Tolerance None; patient tolerated the procedure well. Chest X-ray is ordered to verify placement for internal jugular or subclavian cannulation.   Chest x-ray is not ordered for femoral cannulation.  EBL Minimal  Specimen(s) None   Performed under direct MD supervision.  Performed using ultrasound guidance.  Wire visualized in vessel under ultrasound.

## 2020-11-01 NOTE — Procedures (Signed)
Patient Name: Terrence Henry  MRN: [id number]  Epilepsy Attending: Charlsie Quest  Referring Physician/Provider: Quintella Baton, NP Date: 11/01/2020 Duration: 23.45 minutes  Patient history: 39 year old male status post cardiac arrest.  EEG to evaluate for seizures.  Level of alertness: Comatose  AEDs during EEG study: Versed  Technical aspects: This EEG study was done with scalp electrodes positioned according to the 10-20 International system of electrode placement. Electrical activity was acquired at a sampling rate of 500Hz  and reviewed with a high frequency filter of 70Hz  and a low frequency filter of 1Hz . EEG data were recorded continuously and digitally stored.   Description:   EEG showed continuous generalized predominantly 9-10 Hz generalized alpha activity. EEG was not reactive to noxious stimulation.  Hyperventilation and photic stimulation were not performed.     ABNORMALITY -Alpha coma  IMPRESSION: This study is suggestive of profound diffuse encephalopathy, nonspecific etiology but likely related to sedation, anoxic/hypoxic brain injury. No seizures or epileptiform discharges were seen throughout the recording.   Marleny Faller 

## 2020-11-01 NOTE — Progress Notes (Signed)
Initial Nutrition Assessment  DOCUMENTATION CODES:   Not applicable  INTERVENTION:   Tube feeding:  -Vital 1.5 @ 20 ml/hr via OG -Increase by 10 ml Q12 hours to goal rate of 60 ml/hr (1440 ml) -ProSource TF 90 ml TID  Provides: 2400 kcals, 163 grams protein, 1100 ml free water.   NUTRITION DIAGNOSIS:   Increased nutrient needs related to acute illness as evidenced by estimated needs.  GOAL:   Patient will meet greater than or equal to 90% of their needs  MONITOR:   Vent status, Skin, TF tolerance, Weight trends, Labs, I & O's  REASON FOR ASSESSMENT:   Ventilator    ASSESSMENT:   Patient with PMH significant for polysubstance use and inguinal hernia. Presents this admission with asystolic cardiac arrest 2/2 to suspect OD.   Pt discussed during ICU rounds and with RN.   Requiring pressors. On TTM 36. Awaiting neuro results. Xray confirm OG in stomach. Okay to feed per CCM.   Weight shows to remain stable over the last 6 months. Mother at bedside unsure of weight status or nutrition intake prior to admission.   Patient is currently intubated on ventilator support MV: 20.4 L/min Temp (24hrs), Avg:95 F (35 C), Min:93.1 F (33.9 C), Max:100.2 F (37.9 C)   UOP: 15 ml x 24 hrs   Drips: levophed, potassium phosphate, sodium bicarbonate Labs: Cr 2.53- trending up  NUTRITION - FOCUSED PHYSICAL EXAM:    Most Recent Value  Orbital Region No depletion  Upper Arm Region No depletion  Thoracic and Lumbar Region Unable to assess  Buccal Region No depletion  Temple Region No depletion  Clavicle Bone Region No depletion  Clavicle and Acromion Bone Region No depletion  Scapular Bone Region Unable to assess  Dorsal Hand No depletion  Patellar Region No depletion  Anterior Thigh Region No depletion  Posterior Calf Region No depletion  Edema (RD Assessment) None  Hair Reviewed  Eyes Unable to assess  Mouth Unable to assess  Skin Reviewed  Nails Reviewed      Diet Order:   Diet Order            Diet NPO time specified  Diet effective now                 EDUCATION NEEDS:   Not appropriate for education at this time  Skin:  Skin Assessment: Reviewed RN Assessment  Last BM:  11/22  Height:   Ht Readings from Last 1 Encounters:  10/31/20 6\' 3"  (1.905 m)    Weight:   Wt Readings from Last 1 Encounters:  11/01/20 106.7 kg    BMI:  Body mass index is 29.4 kg/m.  Estimated Nutritional Needs:   Kcal:  2324 kcal  Protein:  160-185 grams  Fluid:  >/= 2L/day  11/03/20 RD, LDN Clinical Nutrition Pager listed in AMION

## 2020-11-02 LAB — PHOSPHORUS: Phosphorus: 3.8 mg/dL (ref 2.5–4.6)

## 2020-11-02 LAB — GLUCOSE, CAPILLARY
Glucose-Capillary: 131 mg/dL — ABNORMAL HIGH (ref 70–99)
Glucose-Capillary: 153 mg/dL — ABNORMAL HIGH (ref 70–99)
Glucose-Capillary: 145 mg/dL — ABNORMAL HIGH (ref 70–99)
Glucose-Capillary: 123 mg/dL — ABNORMAL HIGH (ref 70–99)

## 2020-11-02 LAB — CBC
HCT: 53 % — ABNORMAL HIGH (ref 39.0–52.0)
Hemoglobin: 18.3 g/dL — ABNORMAL HIGH (ref 13.0–17.0)
MCH: 30.2 pg (ref 26.0–34.0)
MCHC: 34.5 g/dL (ref 30.0–36.0)
MCV: 87.5 fL (ref 80.0–100.0)
Platelets: 239 10*3/uL (ref 150–400)
RBC: 6.06 MIL/uL — ABNORMAL HIGH (ref 4.22–5.81)
RDW: 13.2 % (ref 11.5–15.5)
WBC: 11.3 10*3/uL — ABNORMAL HIGH (ref 4.0–10.5)
nRBC: 0 % (ref 0.0–0.2)

## 2020-11-02 LAB — BASIC METABOLIC PANEL
Anion gap: 12 (ref 5–15)
BUN: 34 mg/dL — ABNORMAL HIGH (ref 6–20)
CO2: 20 mmol/L — ABNORMAL LOW (ref 22–32)
Calcium: 7.3 mg/dL — ABNORMAL LOW (ref 8.9–10.3)
Chloride: 108 mmol/L (ref 98–111)
Creatinine, Ser: 2.02 mg/dL — ABNORMAL HIGH (ref 0.61–1.24)
GFR, Estimated: 42 mL/min — ABNORMAL LOW (ref >60)
Glucose, Bld: 162 mg/dL — ABNORMAL HIGH (ref 70–99)
Potassium: 4.1 mmol/L (ref 3.5–5.1)
Sodium: 140 mmol/L (ref 135–145)

## 2020-11-02 LAB — MAGNESIUM: Magnesium: 1.7 mg/dL (ref 1.7–2.4)

## 2020-11-02 MED ORDER — ACETAMINOPHEN 160 MG/5ML PO SOLN
650.0000 mg | Freq: Four times a day (QID) | ORAL | Status: DC | PRN
  Administered 2020-11-02 – 2020-11-10 (×8): 650 mg
  Filled 2020-11-02 (×10): qty 20.3

## 2020-11-02 MED ORDER — NUTRISOURCE FIBER PO PACK
1.0000 | PACK | Freq: Two times a day (BID) | ORAL | Status: DC
  Administered 2020-11-02 – 2020-11-16 (×20): 1
  Filled 2020-11-02 (×32): qty 1

## 2020-11-02 MED ORDER — ACETAMINOPHEN 325 MG PO TABS
650.0000 mg | ORAL_TABLET | ORAL | Status: DC | PRN
  Administered 2020-11-02 (×2): 650 mg
  Filled 2020-11-02 (×3): qty 2

## 2020-11-02 MED ORDER — ACETAMINOPHEN 325 MG PO TABS: 650.0000 mg | ORAL_TABLET | ORAL | Status: DC | PRN

## 2020-11-02 NOTE — Progress Notes (Signed)
NAME:  BEVERLY SURIANO, MRN:  [id number], DOB:  March 19, 1981, LOS: 2 ADMISSION DATE:  10/31/2020, CONSULTATION DATE: 10/31/2020 REFERRING MD: Dr. Deanna Artis, CHIEF COMPLAINT: Cardiac arrest  Brief History   39 year old male with history of polysubstance abuse presented after he was found down in the bathroom with needles around, was given Narcan by family before EMS arrived.  Unknown downtime.  ROSC achieved after 20 minutes of CPR in the ED  History of present illness   39 year old male with polysubstance abuse who presented in the emergency department cardiac arrest.  Patient is intubated so history is taken from emergency department note. Per EMS report patient was found in the bathroom with needles around him, family gave him Narcan before EMS arrived, CPR was initiated per ACLS protocol.  Initial rhythm was asystole, he was given epinephrine x5, then he had V. fib rhythm and followed by PEA arrest, ROSC was achieved after 20 minutes of CPR.  He was intubated.  Postintubation ABG showed pH of 6.9. His EKG showed left bundle branch block, on bedside echo patient had septal wall motion abnormalities.  Cardiology was called they recommend doing stat echocardiogram.  Past Medical History  Polysubstance abuse Inguinal hernia  Significant Hospital Events     Consults:  Cardiology PCCM Neurology   Procedures:  11/21 ETT  Significant Diagnostic Tests:  11/21 CT head > neg acute  11/21 2D echo >EF 40-45%, global hypokinesis, grade 1 diastolic dysfunction   Micro Data:  COVID 11/21 > negative  MRSA PCR 11/21 > negative Blood culture 11/22 > Staphylococcus species seen in 1/2 bottles    Antimicrobials:     Interim history/subjective:  No acute events overnight  Temp trending up, packed with ice and PRN tylenol given   Objective   Blood pressure 108/74, pulse 99, temperature 100 F (37.8 C), temperature source Bladder, resp. rate (!) 35, height 6\' 3"  (1.905 m), weight  109.5 kg, SpO2 100 %. CVP:  [0 mmHg-13 mmHg] 6 mmHg  Vent Mode: PRVC FiO2 (%):  [40 %-90 %] 40 % Set Rate:  [35 bmp] 35 bmp Vt Set:  [580 mL] 580 mL PEEP:  [8 cmH20] 8 cmH20 Plateau Pressure:  [20 cmH20-24 cmH20] 23 cmH20   Intake/Output Summary (Last 24 hours) at 11/02/2020 11/04/2020 Last data filed at 11/02/2020 0800 Gross per 24 hour  Intake 8295.16 ml  Output 2497 ml  Net 5798.16 ml   Filed Weights   10/31/20 1504 11/01/20 0400 11/02/20 0448  Weight: 100 kg 106.7 kg 109.5 kg    Examination: General: Acute ill appearing adult male lying in bed on mechanical ventilation, in NAD HEENT: ETT, MM pink/moist, PERRL,  Neuro: Sedated on vent  CV: s1s2 regular rate and rhythm, no murmur, rubs, or gallops,  PULM:  Clear to ascultation bilaterally, no added breath sounds, no increased work of breathing  GI: soft, bowel sounds active in all 4 quadrants, non-tender, non-distended, tolerating TF Extremities: warm/dry, no edema  Skin: no rashes or lesions  Resolved Hospital Problem list     Assessment & Plan:   Asystolic cardiac arrest  - unknown downtime prior to CPR, 11/04/20 CPR prior to ROSC. Suspect r/t drug OD.  Lactic acidosis Left bundle branch block Circulatory shock P: Start TTM, goal 37 degrees per new guidelines  CVC in place  Continue levophed for MAP > 65 ECHO as above  UDS positive for Amphetamines Trend troponin, lactate Cardiology following, appreciate assistance   Acute hypoxic and hypercapnia respiratory failure  -  Secondary to cardiac arrest  Mixed respiratory/metabolic acidosis  P: Continue ventilator support with lung protective strategies  Wean PEEP and FiO2 for sats greater than 90%. Head of bed elevated 30 degrees. Plateau pressures less than 30 cm H20.  Follow intermittent chest x-ray and ABG.   Ensure adequate pulmonary hygiene  Follow cultures  VAP bundle in place  PAD protocol Continue Bicarb drip   At risk for anoxic encephalopathy AMS  -  post arrest with prolonged downtime  P: Currently sedated on fentanyl and versed  RASS goal 0 to  -1 EEG with porfound diffuse encephalopathy  Consult neurology  Normothermia protocl  PAP protocol   Cardiogenic shock - post arrest  P: Continue pressor support  ECHO as above  CVC in place  CVP 6  Acute Kidney Injury  -in the setting of cardiac arrest and cardiogenic shock. Baseline creatinine 0.80 03/11/2020, creatinine on admission 1/13 with peak of 2.53 P: Follow renal function / urine output Trend Bmet Avoid nephrotoxins Ensure adequate renal perfusion  Enteral hydration   Best practice:  Diet: NPO Pain/Anxiety/Delirium protocol (if indicated): fent/versed gtt VAP protocol (if indicated): Yes DVT prophylaxis: Lovenox GI prophylaxis: Pepcid Glucose control: N/A Mobility: Bedrest Code Status: Full code Family Communication: We will update patient's family  Disposition: ICU  Labs   CBC: Recent Labs  Lab 10/31/20 1501 10/31/20 1506 11/01/20 0414 11/01/20 0417 11/01/20 0429 11/01/20 1336 11/02/20 0336  WBC 20.2*  --  21.1*  --   --   --  11.3*  NEUTROABS 9.3*  --   --   --   --   --   --   HGB 17.7*   < > 20.5* 21.1* 20.7* 18.7* 18.3*  HCT 57.8*   < > 60.7* 62.0* 61.0* 55.0* 53.0*  MCV 98.8  --  89.7  --   --   --  87.5  PLT 223  --  267  --   --   --  239   < > = values in this interval not displayed.    Basic Metabolic Panel: Recent Labs  Lab 10/31/20 1501 10/31/20 1501 10/31/20 1506 10/31/20 1559 11/01/20 0414 11/01/20 0414 11/01/20 0417 11/01/20 0429 11/01/20 1328 11/01/20 1336 11/02/20 0336  NA 141   < > 142  142   < > 140   < > 142 142 139 142 140  K 4.0   < > 3.8  3.8   < > 2.9*   < > 3.1* 3.2* 4.1 4.1 4.1  CL 107  --  111  --  114*  --   --   --  109  --  108  CO2 16*  --   --   --  14*  --   --   --  16*  --  20*  GLUCOSE 128*  --  119*  --  174*  --   --   --  137*  --  162*  BUN 12  --  16  --  23*  --   --   --  28*  --  34*   CREATININE 1.13  --  0.90  --  1.94*  --   --   --  2.53*  --  2.02*  CALCIUM 8.4*  --   --   --  7.3*  --   --   --  7.1*  --  7.3*  MG  --   --   --   --  1.5*  --   --   --   --   --  1.7  PHOS  --   --   --   --  1.3*  --   --   --   --   --  3.8   < > = values in this interval not displayed.   GFR: Estimated Creatinine Clearance: 66.3 mL/min (A) (by C-G formula based on SCr of 2.02 mg/dL (H)). Recent Labs  Lab 10/31/20 1501 10/31/20 1808 11/01/20 0414 11/02/20 0336  WBC 20.2*  --  21.1* 11.3*  LATICACIDVEN 10.1* 6.1*  --   --     Liver Function Tests: Recent Labs  Lab 10/31/20 1501  AST 42*  ALT 26  ALKPHOS 53  BILITOT 0.7  PROT 5.0*  ALBUMIN 2.7*   No results for input(s): LIPASE, AMYLASE in the last 168 hours. No results for input(s): AMMONIA in the last 168 hours.  ABG    Component Value Date/Time   PHART 7.378 11/01/2020 1336   PCO2ART 29.6 (L) 11/01/2020 1336   PO2ART 149 (H) 11/01/2020 1336   HCO3 17.7 (L) 11/01/2020 1336   TCO2 19 (L) 11/01/2020 1336   ACIDBASEDEF 6.0 (H) 11/01/2020 1336   O2SAT 99.0 11/01/2020 1336     Coagulation Profile: No results for input(s): INR, PROTIME in the last 168 hours.  Cardiac Enzymes: No results for input(s): CKTOTAL, CKMB, CKMBINDEX, TROPONINI in the last 168 hours.  HbA1C: No results found for: HGBA1C  CBG: Recent Labs  Lab 11/01/20 1118 11/01/20 1333 11/01/20 1601 11/02/20 0344 11/02/20 0752  GLUCAP 74 136* 89 131* 153*   CRITICAL CARE Performed by: Delfin Gant  Total critical care time: 38 minutes  Critical care time was exclusive of separately billable procedures and treating other patients.  Critical care was necessary to treat or prevent imminent or life-threatening deterioration.  Critical care was time spent personally by me on the following activities: development of treatment plan with patient and/or surrogate as well as nursing, discussions with consultants, evaluation of patient's  response to treatment, examination of patient, obtaining history from patient or surrogate, ordering and performing treatments and interventions, ordering and review of laboratory studies, ordering and review of radiographic studies, pulse oximetry and re-evaluation of patient's condition.  Delfin Gant, NP-C [facility] Contact / Pager information can be found on Amion  11/02/2020, 9:23 AM

## 2020-11-02 NOTE — Plan of Care (Signed)
  Problem: Clinical Measurements: Goal: Ability to maintain clinical measurements within normal limits will improve Outcome: Progressing Goal: Will remain free from infection Outcome: Progressing   

## 2020-11-02 NOTE — Plan of Care (Signed)

## 2020-11-02 NOTE — Plan of Care (Signed)
Patient's temperature trended upwards throughout shift climbing as high as 37.8. Administered prn tylenol and ice packs to achieve temperature goal. Problem: Clinical Measurements: Goal: Ability to maintain clinical measurements within normal limits will improve Outcome: Progressing   Problem: Nutrition: Goal: Adequate nutrition will be maintained Outcome: Progressing   Problem: Elimination: Goal: Will not experience complications related to urinary retention Outcome: Progressing   Problem: Skin Integrity: Goal: Risk for impaired skin integrity will decrease Outcome: Progressing

## 2020-11-03 ENCOUNTER — Inpatient Hospital Stay (HOSPITAL_COMMUNITY): Payer: Self-pay

## 2020-11-03 LAB — GLUCOSE, CAPILLARY
Glucose-Capillary: 102 mg/dL — ABNORMAL HIGH (ref 70–99)
Glucose-Capillary: 121 mg/dL — ABNORMAL HIGH (ref 70–99)
Glucose-Capillary: 122 mg/dL — ABNORMAL HIGH (ref 70–99)
Glucose-Capillary: 139 mg/dL — ABNORMAL HIGH (ref 70–99)
Glucose-Capillary: 147 mg/dL — ABNORMAL HIGH (ref 70–99)
Glucose-Capillary: 73 mg/dL (ref 70–99)
Glucose-Capillary: 97 mg/dL (ref 70–99)

## 2020-11-03 LAB — CULTURE, BLOOD (ROUTINE X 2): Special Requests: ADEQUATE

## 2020-11-03 LAB — BASIC METABOLIC PANEL
Anion gap: 15 (ref 5–15)
BUN: 36 mg/dL — ABNORMAL HIGH (ref 6–20)
CO2: 26 mmol/L (ref 22–32)
Calcium: 7.4 mg/dL — ABNORMAL LOW (ref 8.9–10.3)
Chloride: 97 mmol/L — ABNORMAL LOW (ref 98–111)
Creatinine, Ser: 1.24 mg/dL (ref 0.61–1.24)
GFR, Estimated: >60 mL/min (ref >60)
Glucose, Bld: 153 mg/dL — ABNORMAL HIGH (ref 70–99)
Potassium: 3 mmol/L — ABNORMAL LOW (ref 3.5–5.1)
Sodium: 138 mmol/L (ref 135–145)

## 2020-11-03 LAB — POCT I-STAT 7, (LYTES, BLD GAS, ICA,H+H)
Acid-Base Excess: 8 mmol/L — ABNORMAL HIGH (ref 0.0–2.0)
Bicarbonate: 29.3 mmol/L — ABNORMAL HIGH (ref 20.0–28.0)
Calcium, Ion: 1.03 mmol/L — ABNORMAL LOW (ref 1.15–1.40)
HCT: 44 % (ref 39.0–52.0)
Hemoglobin: 15 g/dL (ref 13.0–17.0)
O2 Saturation: 97 %
Patient temperature: 36.7
Potassium: 2.9 mmol/L — ABNORMAL LOW (ref 3.5–5.1)
Sodium: 137 mmol/L (ref 135–145)
TCO2: 30 mmol/L (ref 22–32)
pCO2 arterial: 29.7 mmHg — ABNORMAL LOW (ref 32.0–48.0)
pH, Arterial: 7.601 (ref 7.350–7.450)
pO2, Arterial: 73 mmHg — ABNORMAL LOW (ref 83.0–108.0)

## 2020-11-03 LAB — CBC
HCT: 46 % (ref 39.0–52.0)
Hemoglobin: 15.4 g/dL (ref 13.0–17.0)
MCH: 30.1 pg (ref 26.0–34.0)
MCHC: 33.5 g/dL (ref 30.0–36.0)
MCV: 89.8 fL (ref 80.0–100.0)
Platelets: 154 10*3/uL (ref 150–400)
RBC: 5.12 MIL/uL (ref 4.22–5.81)
RDW: 13.4 % (ref 11.5–15.5)
WBC: 7.4 10*3/uL (ref 4.0–10.5)
nRBC: 0 % (ref 0.0–0.2)

## 2020-11-03 MED ORDER — LORAZEPAM 2 MG/ML IJ SOLN
0.5000 mg | Freq: Once | INTRAMUSCULAR | Status: AC
  Administered 2020-11-04: 0.5 mg via INTRAVENOUS
  Filled 2020-11-03: qty 1

## 2020-11-03 MED ORDER — POTASSIUM CHLORIDE 20 MEQ/15ML (10%) PO SOLN
40.0000 meq | Freq: Three times a day (TID) | ORAL | Status: DC
  Filled 2020-11-03: qty 30

## 2020-11-03 MED ORDER — FUROSEMIDE 10 MG/ML IJ SOLN
40.0000 mg | Freq: Four times a day (QID) | INTRAMUSCULAR | Status: AC
  Administered 2020-11-03 (×2): 40 mg via INTRAVENOUS
  Filled 2020-11-03 (×2): qty 4

## 2020-11-03 MED ORDER — ONDANSETRON HCL 4 MG/2ML IJ SOLN: 4.0000 mg | Freq: Four times a day (QID) | INTRAMUSCULAR | Status: DC | PRN

## 2020-11-03 MED ORDER — DEXTROSE 50 % IV SOLN
INTRAVENOUS | Status: AC
  Filled 2020-11-03: qty 50

## 2020-11-03 MED ORDER — GERHARDT'S BUTT CREAM
TOPICAL_CREAM | CUTANEOUS | Status: DC | PRN
  Administered 2020-11-13: 1 via TOPICAL
  Filled 2020-11-03 (×2): qty 1

## 2020-11-03 MED ORDER — ONDANSETRON HCL 4 MG/2ML IJ SOLN
INTRAMUSCULAR | Status: AC
  Administered 2020-11-03: 4 mg via INTRAVENOUS
  Filled 2020-11-03: qty 2

## 2020-11-03 MED ORDER — POTASSIUM CHLORIDE 20 MEQ/15ML (10%) PO SOLN
40.0000 meq | Freq: Three times a day (TID) | ORAL | Status: AC
  Administered 2020-11-03 (×2): 40 meq
  Filled 2020-11-03 (×3): qty 30

## 2020-11-03 NOTE — Progress Notes (Signed)
eLink Physician-Brief Progress Note Patient Name: Terrence Henry DOB: 1981/02/01 MRN: [id number]   Date of Service  11/03/2020  HPI/Events of Note  Notified of agitation. Unable to titrate up Precedex due to hypotension. Given due dose alprazolam 4 hours prior  Patient seen hitting the bed and pulling off BiPap  eICU Interventions  Ordered a one time dose of lorazepam 0.5 mg IV which hopefully will tide her over until the next alprazolam dose Hold Lasix due to hypotension     Intervention Category Minor Interventions: Agitation / anxiety - evaluation and management  Darl Pikes 11/03/2020, 11:40 PM

## 2020-11-03 NOTE — Progress Notes (Signed)
Patient restless, vomiting large amount. Tan, tube feeding colored, liquid suctioned from Mouth and ETT. Grabbing at ETT., biting tube, not following commands. E-Link notified, TF held , Zofran given,Versed restarted, Restraints applied and CXR completed per order. Will continue to monitor pt

## 2020-11-03 NOTE — Progress Notes (Signed)
NAME:  Terrence Henry, MRN:  [id number], DOB:  12-Sep-1981, LOS: 3 ADMISSION DATE:  10/31/2020, CONSULTATION DATE: 10/31/2020 REFERRING MD: Dr. Deanna Artis, CHIEF COMPLAINT: Cardiac arrest  Brief History   39 year old male with history of polysubstance abuse presented after he was found down in the bathroom with needles around, was given Narcan by family before EMS arrived.  Unknown downtime.  ROSC achieved after 20 minutes of CPR in the ED  History of present illness   39 year old male with polysubstance abuse who presented in the emergency department cardiac arrest.  Patient is intubated so history is taken from emergency department note. Per EMS report patient was found in the bathroom with needles around him, family gave him Narcan before EMS arrived, CPR was initiated per ACLS protocol.  Initial rhythm was asystole, he was given epinephrine x5, then he had V. fib rhythm and followed by PEA arrest, ROSC was achieved after 20 minutes of CPR.  He was intubated.  Postintubation ABG showed pH of 6.9. His EKG showed left bundle branch block, on bedside echo patient had septal wall motion abnormalities.  Cardiology was called they recommend doing stat echocardiogram.  Past Medical History  Polysubstance abuse Inguinal hernia  Significant Hospital Events     Consults:  Cardiology PCCM Neurology   Procedures:  11/21 ETT  Significant Diagnostic Tests:  11/21 CT head > neg acute  11/21 2D echo >EF 40-45%, global hypokinesis, grade 1 diastolic dysfunction   Micro Data:  COVID 11/21 > negative  MRSA PCR 11/21 > negative Blood culture 11/22 > Staphylococcus species seen in 1/2 bottles    Antimicrobials:     Interim history/subjective:  No acute events overnight  Currently +12L  Objective   Blood pressure 137/71, pulse (!) 102, temperature 97.9 F (36.6 C), temperature source Bladder, resp. rate (!) 35, height 6\' 3"  (1.905 m), weight 112.7 kg, SpO2 99 %. CVP:  [3 mmHg-25  mmHg] 25 mmHg  Vent Mode: PRVC FiO2 (%):  [40 %] 40 % Set Rate:  [35 bmp] 35 bmp Vt Set:  [580 mL] 580 mL PEEP:  [5 cmH20-8 cmH20] 5 cmH20 Plateau Pressure:  [22 cmH20-24 cmH20] 22 cmH20   Intake/Output Summary (Last 24 hours) at 11/03/2020 0719 Last data filed at 11/03/2020 0700 Gross per 24 hour  Intake 5683.09 ml  Output 3299 ml  Net 2384.09 ml   Filed Weights   11/01/20 0400 11/02/20 0448 11/03/20 0500  Weight: 106.7 kg 109.5 kg 112.7 kg    Examination: General: Acute ill appearing adult  Male lying  on mechanical ventilation, in NAD HEENT: ETT, MM pink/moist, PERRL,  Neuro: responsive to pain, opens eyes to sternal rub CV: s1s2 regular rate and rhythm, no murmur, rubs, or gallops,  PULM:  Clear to ascultation, no added breath sounds, tolerating vent well  GI: soft, bowel sounds active in all 4 quadrants, non-tender, non-distended Extremities: warm/dry, 3+ non-pitting edema  Skin: no rashes or lesions  Resolved Hospital Problem list     Assessment & Plan:   Asystolic cardiac arrest  -unknown downtime prior to CPR, 11/05/20 CPR prior to ROSC. Suspect r/t drug OD.  Lactic acidosis Left bundle branch block Circulatory shock P: Continue with goal of normothermia  CVC remains in place  No longer requiring pressor support  ECHO as above  Cardiology assessed during admission   Acute hypoxic and hypercapnia respiratory failure  - Secondary to cardiac arrest  Mixed respiratory/metabolic acidosis  P: Continue ventilator support with lung protective strategies  Wean PEEP and FiO2 for sats greater than 90%. Head of bed elevated 30 degrees. Plateau pressures less than 30 cm H20.  Follow intermittent chest x-ray and ABG.   SAT/SBT as tolerated, mentation preclude extubation  Ensure adequate pulmonary hygiene  Follow cultures  VAP bundle in place  PAD protocol Stop bicarb drip   At risk for anoxic encephalopathy AMS  - post arrest with prolonged downtime   P: Continues to require fentanyl and versed for vent compliance  Try to wean versed, can try precedex as alternative  RASS goal 0 to -1 EEG with profound diffuse encephalopathy but no seizures  PAD protocol  Consider neurology consult   Cardiogenic shock - post arrest, improved  P: Levophed stopped 11/23 ECHO as above  Supportive care  Currently +12L  CVP 25  Diurese today   Acute Kidney Injury, improved  -in the setting of cardiac arrest and cardiogenic shock. Baseline creatinine 0.80 03/11/2020, creatinine on admission 1/13 with peak of 2.53 P: Follow renal function / urine output Trend Bmet Avoid nephrotoxins Ensure adequate renal perfusion  Stop further IV hydration  Gentel diuresing today   Best practice:  Diet: NPO Pain/Anxiety/Delirium protocol (if indicated): fent/versed gtt VAP protocol (if indicated): Yes DVT prophylaxis: Lovenox GI prophylaxis: Pepcid Glucose control: N/A Mobility: Bedrest last date of multidisciplinary goals of care discussion: 11/23 Family and staff present: Lawson Fiscal (mom) and Toma Copier (sister)  Summary of discussion: GOC discussion and updated given to mother and sister evening of 11/23. Family was informed of current plan of care and projection. Family agreed to continue current treatment with hopefully watching and waiting for neurological recovery.  Follow up goals of care discussion due: 11/26 Code Status: Full code Family Communication: We will update patient's family  Disposition: ICU  Labs   CBC: Recent Labs  Lab 10/31/20 1501 10/31/20 1506 11/01/20 0414 11/01/20 0414 11/01/20 0417 11/01/20 0429 11/01/20 1336 11/02/20 0336 11/03/20 0549  WBC 20.2*  --  21.1*  --   --   --   --  11.3* 7.4  NEUTROABS 9.3*  --   --   --   --   --   --   --   --   HGB 17.7*   < > 20.5*   < > 21.1* 20.7* 18.7* 18.3* 15.4  HCT 57.8*   < > 60.7*   < > 62.0* 61.0* 55.0* 53.0* 46.0  MCV 98.8  --  89.7  --   --   --   --  87.5 89.8  PLT 223  --  267   --   --   --   --  239 154   < > = values in this interval not displayed.    Basic Metabolic Panel: Recent Labs  Lab 10/31/20 1501 10/31/20 1501 10/31/20 1506 10/31/20 1559 11/01/20 0414 11/01/20 0417 11/01/20 0429 11/01/20 1328 11/01/20 1336 11/02/20 0336 11/03/20 0549  NA 141   < > 142  142   < > 140   < > 142 139 142 140 138  K 4.0   < > 3.8  3.8   < > 2.9*   < > 3.2* 4.1 4.1 4.1 3.0*  CL 107   < > 111  --  114*  --   --  109  --  108 97*  CO2 16*  --   --   --  14*  --   --  16*  --  20* 26  GLUCOSE 128*   < >  119*  --  174*  --   --  137*  --  162* 153*  BUN 12   < > 16  --  23*  --   --  28*  --  34* 36*  CREATININE 1.13   < > 0.90  --  1.94*  --   --  2.53*  --  2.02* 1.24  CALCIUM 8.4*  --   --   --  7.3*  --   --  7.1*  --  7.3* 7.4*  MG  --   --   --   --  1.5*  --   --   --   --  1.7  --   PHOS  --   --   --   --  1.3*  --   --   --   --  3.8  --    < > = values in this interval not displayed.   GFR: Estimated Creatinine Clearance: 109.4 mL/min (by C-G formula based on SCr of 1.24 mg/dL). Recent Labs  Lab 10/31/20 1501 10/31/20 1808 11/01/20 0414 11/02/20 0336 11/03/20 0549  WBC 20.2*  --  21.1* 11.3* 7.4  LATICACIDVEN 10.1* 6.1*  --   --   --     Liver Function Tests: Recent Labs  Lab 10/31/20 1501  AST 42*  ALT 26  ALKPHOS 53  BILITOT 0.7  PROT 5.0*  ALBUMIN 2.7*   No results for input(s): LIPASE, AMYLASE in the last 168 hours. No results for input(s): AMMONIA in the last 168 hours.  ABG    Component Value Date/Time   PHART 7.378 11/01/2020 1336   PCO2ART 29.6 (L) 11/01/2020 1336   PO2ART 149 (H) 11/01/2020 1336   HCO3 17.7 (L) 11/01/2020 1336   TCO2 19 (L) 11/01/2020 1336   ACIDBASEDEF 6.0 (H) 11/01/2020 1336   O2SAT 99.0 11/01/2020 1336     Coagulation Profile: No results for input(s): INR, PROTIME in the last 168 hours.  Cardiac Enzymes: No results for input(s): CKTOTAL, CKMB, CKMBINDEX, TROPONINI in the last 168  hours.  HbA1C: No results found for: HGBA1C  CBG: Recent Labs  Lab 11/02/20 0752 11/02/20 1202 11/02/20 1956 11/03/20 0147 11/03/20 0414  GLUCAP 153* 145* 123* 122* 102*   CRITICAL CARE Performed by: Delfin Gant  Total critical care time:  Critical care time was exclusive of separately billable procedures and treating other patients.  Critical care was necessary to treat or prevent imminent or life-threatening deterioration.  Critical care was time spent personally by me on the following activities: development of treatment plan with patient and/or surrogate as well as nursing, discussions with consultants, evaluation of patient's response to treatment, examination of patient, obtaining history from patient or surrogate, ordering and performing treatments and interventions, ordering and review of laboratory studies, ordering and review of radiographic studies, pulse oximetry and re-evaluation of patient's condition.  Delfin Gant, NP-C [facility] Contact / Pager information can be found on Amion  11/03/2020, 7:19 AM

## 2020-11-03 NOTE — Progress Notes (Addendum)
eLink Physician-Brief Progress Note Patient Name: DWON SKY DOB: 03/16/1981 MRN: [id number]   Date of Service  11/03/2020  HPI/Events of Note  Notified of emesis and agitation Sedation increased  eICU Interventions  Zofran and CXR ordered Restraints ordered as requested eLink to be informed once xray done  CXR unchanged. No need to start antibiotics at this time. Continue to hold TF for now and place feeding tube on LIWS     Intervention Category Major Interventions: Other:  Darl Pikes 11/03/2020, 8:41 PM

## 2020-11-04 ENCOUNTER — Other Ambulatory Visit: Payer: Self-pay

## 2020-11-04 ENCOUNTER — Inpatient Hospital Stay (HOSPITAL_COMMUNITY): Payer: Self-pay

## 2020-11-04 LAB — CBC
HCT: 45.5 % (ref 39.0–52.0)
Hemoglobin: 15.3 g/dL (ref 13.0–17.0)
MCH: 29.8 pg (ref 26.0–34.0)
MCHC: 33.6 g/dL (ref 30.0–36.0)
MCV: 88.5 fL (ref 80.0–100.0)
Platelets: 151 10*3/uL (ref 150–400)
RBC: 5.14 MIL/uL (ref 4.22–5.81)
RDW: 13.5 % (ref 11.5–15.5)
WBC: 7 10*3/uL (ref 4.0–10.5)
nRBC: 0 % (ref 0.0–0.2)

## 2020-11-04 LAB — GLUCOSE, CAPILLARY
Glucose-Capillary: 88 mg/dL (ref 70–99)
Glucose-Capillary: 106 mg/dL — ABNORMAL HIGH (ref 70–99)
Glucose-Capillary: 114 mg/dL — ABNORMAL HIGH (ref 70–99)
Glucose-Capillary: 100 mg/dL — ABNORMAL HIGH (ref 70–99)
Glucose-Capillary: 72 mg/dL (ref 70–99)

## 2020-11-04 LAB — COMPREHENSIVE METABOLIC PANEL
ALT: 133 U/L — ABNORMAL HIGH (ref 0–44)
AST: 58 U/L — ABNORMAL HIGH (ref 15–41)
Albumin: 2 g/dL — ABNORMAL LOW (ref 3.5–5.0)
Alkaline Phosphatase: 52 U/L (ref 38–126)
Anion gap: 15 (ref 5–15)
BUN: 37 mg/dL — ABNORMAL HIGH (ref 6–20)
CO2: 24 mmol/L (ref 22–32)
Calcium: 8.3 mg/dL — ABNORMAL LOW (ref 8.9–10.3)
Chloride: 98 mmol/L (ref 98–111)
Creatinine, Ser: 1.11 mg/dL (ref 0.61–1.24)
GFR, Estimated: >60 mL/min (ref >60)
Glucose, Bld: 111 mg/dL — ABNORMAL HIGH (ref 70–99)
Potassium: 3.6 mmol/L (ref 3.5–5.1)
Sodium: 137 mmol/L (ref 135–145)
Total Bilirubin: 1.4 mg/dL — ABNORMAL HIGH (ref 0.3–1.2)
Total Protein: 5.1 g/dL — ABNORMAL LOW (ref 6.5–8.1)

## 2020-11-04 MED ORDER — DEXMEDETOMIDINE HCL IN NACL 400 MCG/100ML IV SOLN
0.4000 ug/kg/h | INTRAVENOUS | Status: DC
  Administered 2020-11-04: 0.8 ug/kg/h via INTRAVENOUS
  Administered 2020-11-04: 0.6 ug/kg/h via INTRAVENOUS
  Administered 2020-11-04 (×2): 0.8 ug/kg/h via INTRAVENOUS
  Administered 2020-11-05: 0.6 ug/kg/h via INTRAVENOUS
  Administered 2020-11-05: 1 ug/kg/h via INTRAVENOUS
  Administered 2020-11-05 (×2): 1.2 ug/kg/h via INTRAVENOUS
  Administered 2020-11-05: 0.8 ug/kg/h via INTRAVENOUS
  Administered 2020-11-05: 1 ug/kg/h via INTRAVENOUS
  Administered 2020-11-06 (×4): 1.2 ug/kg/h via INTRAVENOUS
  Administered 2020-11-06: 1 ug/kg/h via INTRAVENOUS
  Administered 2020-11-06: 0.6 ug/kg/h via INTRAVENOUS
  Administered 2020-11-07 (×2): 1.2 ug/kg/h via INTRAVENOUS
  Administered 2020-11-07: 1.1 ug/kg/h via INTRAVENOUS
  Administered 2020-11-07: 1.2 ug/kg/h via INTRAVENOUS
  Administered 2020-11-07: 0.8 ug/kg/h via INTRAVENOUS
  Administered 2020-11-07: 1.2 ug/kg/h via INTRAVENOUS
  Administered 2020-11-07: 0.6 ug/kg/h via INTRAVENOUS
  Administered 2020-11-08 – 2020-11-09 (×9): 1.2 ug/kg/h via INTRAVENOUS
  Filled 2020-11-04 (×3): qty 100
  Filled 2020-11-04: qty 200
  Filled 2020-11-04 (×7): qty 100
  Filled 2020-11-04: qty 200
  Filled 2020-11-04 (×9): qty 100
  Filled 2020-11-04: qty 200
  Filled 2020-11-04 (×9): qty 100

## 2020-11-04 MED ORDER — POTASSIUM CHLORIDE 10 MEQ/50ML IV SOLN
10.0000 meq | INTRAVENOUS | Status: AC
  Administered 2020-11-04 (×3): 10 meq via INTRAVENOUS
  Filled 2020-11-04 (×3): qty 50

## 2020-11-04 MED ORDER — POTASSIUM CHLORIDE 20 MEQ PO PACK: 40.0000 meq | PACK | Freq: Once | ORAL | Status: DC

## 2020-11-04 MED ORDER — METOCLOPRAMIDE HCL 5 MG/ML IJ SOLN
10.0000 mg | Freq: Four times a day (QID) | INTRAMUSCULAR | Status: AC
  Administered 2020-11-04 – 2020-11-06 (×8): 10 mg via INTRAVENOUS
  Filled 2020-11-04 (×8): qty 2

## 2020-11-04 NOTE — Plan of Care (Signed)

## 2020-11-04 NOTE — Progress Notes (Signed)
NAME:  Terrence Henry, MRN:  [id number], DOB:  02-May-1981, LOS: 4 ADMISSION DATE:  10/31/2020, CONSULTATION DATE: 10/31/2020 REFERRING MD: Dr. Deanna Artis, CHIEF COMPLAINT: Cardiac arrest  Brief History   39 year old male with history of polysubstance abuse presented after he was found down in the bathroom with needles around, was given Narcan by family before EMS arrived.  Unknown downtime.  ROSC achieved after 20 minutes of CPR in the ED  History of present illness   39 year old male with polysubstance abuse who presented in the emergency department cardiac arrest.  Patient is intubated so history is taken from emergency department note. Per EMS report patient was found in the bathroom with needles around him, family gave him Narcan before EMS arrived, CPR was initiated per ACLS protocol.  Initial rhythm was asystole, he was given epinephrine x5, then he had V. fib rhythm and followed by PEA arrest, ROSC was achieved after 20 minutes of CPR.  He was intubated.  Postintubation ABG showed pH of 6.9. His EKG showed left bundle branch block, on bedside echo patient had septal wall motion abnormalities.  Cardiology was called they recommend doing stat echocardiogram.  Past Medical History  Polysubstance abuse Inguinal hernia  Significant Hospital Events     Consults:  Cardiology PCCM Neurology   Procedures:  11/21 ETT  Significant Diagnostic Tests:  11/21 CT head > neg acute  11/21 2D echo >EF 40-45%, global hypokinesis, grade 1 diastolic dysfunction   Micro Data:  COVID 11/21 > negative  MRSA PCR 11/21 > negative Blood culture 11/22 > Staphylococcus species seen in 1/2 bottles    Antimicrobials:     Interim history/subjective:   Agitated with sedation weaning overnight.  Purposeful.  Objective   Blood pressure 135/89, pulse (!) 101, temperature 98.5 F (36.9 C), temperature source Axillary, resp. rate (!) 24, height 6\' 3"  (1.905 m), weight 116.7 kg, SpO2 100  %. CVP:  [4 mmHg-25 mmHg] 7 mmHg  Vent Mode: CPAP;PSV FiO2 (%):  [40 %] 40 % Set Rate:  [35 bmp] 35 bmp Vt Set:  [580 mL] 580 mL PEEP:  [5 cmH20] 5 cmH20 Pressure Support:  [15 cmH20] 15 cmH20 Plateau Pressure:  [20 cmH20-32 cmH20] 22 cmH20   Intake/Output Summary (Last 24 hours) at 11/04/2020 1219 Last data filed at 11/04/2020 1100 Gross per 24 hour  Intake 1102.83 ml  Output 4225 ml  Net -3122.17 ml   Filed Weights   11/02/20 0448 11/03/20 0500 11/04/20 0400  Weight: 109.5 kg 112.7 kg 116.7 kg    Examination: General: Acute ill appearing adult  Male lying  on mechanical ventilation, in NAD HEENT: ETT, MM pink/moist, PERRL,  Neuro: responsive to pain, opens eyes to sternal rub current level sedation CV: s1s2 regular rate and rhythm, no murmur, rubs, or gallops,  PULM:  Clear to ascultation, no added breath sounds, tolerating vent well  GI: soft, bowel sounds active in all 4 quadrants, non-tender, non-distended Extremities: warm/dry, 3+ non-pitting edema  Skin: no rashes or lesions  Resolved Hospital Problem list   Acute kidney injury  Assessment & Plan:   Critically ill due to asystolic cardiac arrest requiring TTM.  Showing signs of purposeful movement indicating possibility of new reasonable neurological recovery -Continue targeted temperature management, maintain temperature less than 37 5 -Continue to wean sedatives.  Given prior drug history he may require a gradual taper -We will start Precedex to assist in transition off of Versed.  Acute hypoxic and hypercapnia respiratory failure  -Tolerating pressure  support respiration. -Mental status precludes extubation  Cardiogenic shock - post arrest, improved  -Continue to diurese  Possible gastric ileus with episode of emesis last night. -KUB to assess stool burden given prior drug use -Start prokinetic and resume feeds tomorrow  Best practice:  Diet: NPO - trickle feeds.  Pain/Anxiety/Delirium protocol (if  indicated): fent/versed gtt VAP protocol (if indicated): Yes DVT prophylaxis: Lovenox GI prophylaxis: Pepcid Glucose control: N/A Mobility: Bedrest last date of multidisciplinary goals of care discussion: 11/23 Family and staff present: Lawson Fiscal (mom) and Toma Copier (sister)  Summary of discussion: GOC discussion and updated given to mother and sister evening of 11/23. Family was informed of current plan of care and projection. Family agreed to continue current treatment with hopefully watching and waiting for neurological recovery.  Follow up goals of care discussion due: 11/26 Code Status: Full code Family Communication: We will update patient's family  Disposition: ICU  Labs   CBC: Recent Labs  Lab 10/31/20 1501 10/31/20 1506 11/01/20 0414 11/01/20 0417 11/01/20 1336 11/02/20 0336 11/03/20 0549 11/03/20 0807 11/04/20 0446  WBC 20.2*  --  21.1*  --   --  11.3* 7.4  --  7.0  NEUTROABS 9.3*  --   --   --   --   --   --   --   --   HGB 17.7*   < > 20.5*   < > 18.7* 18.3* 15.4 15.0 15.3  HCT 57.8*   < > 60.7*   < > 55.0* 53.0* 46.0 44.0 45.5  MCV 98.8  --  89.7  --   --  87.5 89.8  --  88.5  PLT 223  --  267  --   --  239 154  --  151   < > = values in this interval not displayed.    Basic Metabolic Panel: Recent Labs  Lab 11/01/20 0414 11/01/20 0417 11/01/20 1328 11/01/20 1328 11/01/20 1336 11/02/20 0336 11/03/20 0549 11/03/20 0807 11/04/20 0446  NA 140   < > 139   < > 142 140 138 137 137  K 2.9*   < > 4.1   < > 4.1 4.1 3.0* 2.9* 3.6  CL 114*  --  109  --   --  108 97*  --  98  CO2 14*  --  16*  --   --  20* 26  --  24  GLUCOSE 174*  --  137*  --   --  162* 153*  --  111*  BUN 23*  --  28*  --   --  34* 36*  --  37*  CREATININE 1.94*  --  2.53*  --   --  2.02* 1.24  --  1.11  CALCIUM 7.3*  --  7.1*  --   --  7.3* 7.4*  --  8.3*  MG 1.5*  --   --   --   --  1.7  --   --   --   PHOS 1.3*  --   --   --   --  3.8  --   --   --    < > = values in this interval not  displayed.   GFR: Estimated Creatinine Clearance: 124.3 mL/min (by C-G formula based on SCr of 1.11 mg/dL). Recent Labs  Lab 10/31/20 1501 10/31/20 1501 10/31/20 1808 11/01/20 0414 11/02/20 0336 11/03/20 0549 11/04/20 0446  WBC 20.2*   < >  --  21.1* 11.3* 7.4 7.0  LATICACIDVEN 10.1*  --  6.1*  --   --   --   --    < > = values in this interval not displayed.    Liver Function Tests: Recent Labs  Lab 10/31/20 1501 11/04/20 0446  AST 42* 58*  ALT 26 133*  ALKPHOS 53 52  BILITOT 0.7 1.4*  PROT 5.0* 5.1*  ALBUMIN 2.7* 2.0*   No results for input(s): LIPASE, AMYLASE in the last 168 hours. No results for input(s): AMMONIA in the last 168 hours.  ABG    Component Value Date/Time   PHART 7.601 (HH) 11/03/2020 0807   PCO2ART 29.7 (L) 11/03/2020 0807   PO2ART 73 (L) 11/03/2020 0807   HCO3 29.3 (H) 11/03/2020 0807   TCO2 30 11/03/2020 0807   ACIDBASEDEF 6.0 (H) 11/01/2020 1336   O2SAT 97.0 11/03/2020 0807     Coagulation Profile: No results for input(s): INR, PROTIME in the last 168 hours.  Cardiac Enzymes: No results for input(s): CKTOTAL, CKMB, CKMBINDEX, TROPONINI in the last 168 hours.  HbA1C: No results found for: HGBA1C  CBG: Recent Labs  Lab 11/03/20 1954 11/03/20 2341 11/04/20 0339 11/04/20 0743 11/04/20 1144  GLUCAP 97 73 88 106* 114*   CRITICAL CARE Performed by: Lynnell Catalan  Total critical care time:  Critical care time was exclusive of separately billable procedures and treating other patients.  Critical care was necessary to treat or prevent imminent or life-threatening deterioration.  Critical care was time spent personally by me on the following activities: development of treatment plan with patient and/or surrogate as well as nursing, discussions with consultants, evaluation of patient's response to treatment, examination of patient, obtaining history from patient or surrogate, ordering and performing treatments and  interventions, ordering and review of laboratory studies, ordering and review of radiographic studies, pulse oximetry and re-evaluation of patient's condition.  Lynnell Catalan, MD [facility] ICU Physician [facility] [place] [facility]  Pager: [phone] Or Epic Secure Chat After hours: [phone].  11/04/2020, 12:26 PM      11/04/2020, 12:19 PM

## 2020-11-05 ENCOUNTER — Inpatient Hospital Stay (HOSPITAL_COMMUNITY): Payer: Self-pay

## 2020-11-05 LAB — CULTURE, BLOOD (ROUTINE X 2)
Culture: NO GROWTH
Special Requests: ADEQUATE

## 2020-11-05 LAB — COMPREHENSIVE METABOLIC PANEL
ALT: 82 U/L — ABNORMAL HIGH (ref 0–44)
AST: 34 U/L (ref 15–41)
Albumin: 1.8 g/dL — ABNORMAL LOW (ref 3.5–5.0)
Alkaline Phosphatase: 44 U/L (ref 38–126)
Anion gap: 15 (ref 5–15)
BUN: 40 mg/dL — ABNORMAL HIGH (ref 6–20)
CO2: 23 mmol/L (ref 22–32)
Calcium: 8 mg/dL — ABNORMAL LOW (ref 8.9–10.3)
Chloride: 101 mmol/L (ref 98–111)
Creatinine, Ser: 1.18 mg/dL (ref 0.61–1.24)
GFR, Estimated: >60 mL/min (ref >60)
Glucose, Bld: 93 mg/dL (ref 70–99)
Potassium: 3.4 mmol/L — ABNORMAL LOW (ref 3.5–5.1)
Sodium: 139 mmol/L (ref 135–145)
Total Bilirubin: 1.4 mg/dL — ABNORMAL HIGH (ref 0.3–1.2)
Total Protein: 4.9 g/dL — ABNORMAL LOW (ref 6.5–8.1)

## 2020-11-05 LAB — GLUCOSE, CAPILLARY
Glucose-Capillary: 76 mg/dL (ref 70–99)
Glucose-Capillary: 81 mg/dL (ref 70–99)

## 2020-11-05 LAB — CBC
HCT: 42.1 % (ref 39.0–52.0)
Hemoglobin: 13.5 g/dL (ref 13.0–17.0)
MCH: 29.2 pg (ref 26.0–34.0)
MCHC: 32.1 g/dL (ref 30.0–36.0)
MCV: 90.9 fL (ref 80.0–100.0)
Platelets: 131 10*3/uL — ABNORMAL LOW (ref 150–400)
RBC: 4.63 MIL/uL (ref 4.22–5.81)
RDW: 13.7 % (ref 11.5–15.5)
WBC: 9.2 10*3/uL (ref 4.0–10.5)
nRBC: 0 % (ref 0.0–0.2)

## 2020-11-05 MED ORDER — POTASSIUM CHLORIDE 10 MEQ/50ML IV SOLN
10.0000 meq | INTRAVENOUS | Status: AC
  Administered 2020-11-05 (×4): 10 meq via INTRAVENOUS
  Filled 2020-11-05 (×2): qty 50

## 2020-11-05 NOTE — Plan of Care (Signed)
°  Problem: Education: Goal: Knowledge of General Education information will improve Description: Including pain rating scale, medication(s)/side effects and non-pharmacologic comfort measures Outcome: Progressing   Problem: Health Behavior/Discharge Planning: Goal: Ability to manage health-related needs will improve Outcome: Progressing   Problem: Clinical Measurements: Goal: Ability to maintain clinical measurements within normal limits will improve Outcome: Progressing Goal: Will remain free from infection Outcome: Progressing Goal: Diagnostic test results will improve Outcome: Progressing Goal: Respiratory complications will improve Outcome: Progressing Goal: Cardiovascular complication will be avoided Outcome: Progressing   Problem: Activity: Goal: Risk for activity intolerance will decrease Outcome: Progressing   Problem: Nutrition: Goal: Adequate nutrition will be maintained Outcome: Progressing   Problem: Coping: Goal: Level of anxiety will decrease Outcome: Progressing   Problem: Elimination: Goal: Will not experience complications related to bowel motility Outcome: Progressing Goal: Will not experience complications related to urinary retention Outcome: Progressing   Problem: Pain Managment: Goal: General experience of comfort will improve Outcome: Progressing   Problem: Safety: Goal: Ability to remain free from injury will improve Outcome: Progressing   Problem: Skin Integrity: Goal: Risk for impaired skin integrity will decrease Outcome: Progressing   Pt continues to become agitated when awake, pulling out tube. Restraints remain in place

## 2020-11-05 NOTE — Progress Notes (Signed)
[facility] ADULT ICU REPLACEMENT PROTOCOL   The patient does apply for the [facility] Adult ICU Electrolyte Replacment Protocol based on the criteria listed below:   1. Is GFR >/= 30 ml/min? Yes.    Patient's GFR today is >60 2. Is SCr </= 2? Yes.   Patient's SCr is 1.18 ml/kg/hr 3. Did SCr increase >/= 0.5 in 24 hours? No. 4. Abnormal electrolyte(s): k 3.4 5. Ordered repletion with: protocol 6. If a panic level lab has been reported, has the CCM MD in charge been notified? No..   Physician:    Markus Daft A 11/05/2020 6:11 AM

## 2020-11-05 NOTE — Progress Notes (Signed)
NAME:  Terrence Henry, MRN:  [id number], DOB:  Jan 28, 1981, LOS: 5 ADMISSION DATE:  10/31/2020, CONSULTATION DATE: 10/31/2020 REFERRING MD: Dr. Deanna Artis, CHIEF COMPLAINT: Cardiac arrest  Brief History   39 year old male with history of polysubstance abuse presented after he was found down in the bathroom with needles around, was given Narcan by family before EMS arrived.  Unknown downtime.  ROSC achieved after 20 minutes of CPR in the ED  History of present illness   39 year old male with polysubstance abuse who presented in the emergency department cardiac arrest.  Patient is intubated so history is taken from emergency department note. Per EMS report patient was found in the bathroom with needles around him, family gave him Narcan before EMS arrived, CPR was initiated per ACLS protocol.  Initial rhythm was asystole, he was given epinephrine x5, then he had V. fib rhythm and followed by PEA arrest, ROSC was achieved after 20 minutes of CPR.  He was intubated.  Postintubation ABG showed pH of 6.9. His EKG showed left bundle branch block, on bedside echo patient had septal wall motion abnormalities.  Cardiology was called they recommend doing stat echocardiogram.  Past Medical History  Polysubstance abuse Inguinal hernia  Significant Hospital Events     Consults:  Cardiology PCCM Neurology   Procedures:  11/21 ETT  Significant Diagnostic Tests:  11/21 CT head > neg acute  11/21 2D echo >EF 40-45%, global hypokinesis, grade 1 diastolic dysfunction   Micro Data:  COVID 11/21 > negative  MRSA PCR 11/21 > negative Blood culture 11/22 > Staphylococcus species seen in 1/2 bottles    Antimicrobials:     Interim history/subjective:   Sedation reduced further now following commands weakly. Continues to have copious NGT output.   Objective   Blood pressure (!) 109/49, pulse 69, temperature 100 F (37.8 C), temperature source Esophageal, resp. rate (!) 28, height 6\' 3"   (1.905 m), weight 115 kg, SpO2 100 %. CVP:  [7 mmHg] 7 mmHg  Vent Mode: PRVC FiO2 (%):  [40 %] 40 % Set Rate:  [35 bmp] 35 bmp Vt Set:  [580 mL] 580 mL PEEP:  [5 cmH20] 5 cmH20 Pressure Support:  [15 cmH20] 15 cmH20 Plateau Pressure:  [22 cmH20-25 cmH20] 24 cmH20   Intake/Output Summary (Last 24 hours) at 11/05/2020 1014 Last data filed at 11/05/2020 0900 Gross per 24 hour  Intake 1697.68 ml  Output 3575 ml  Net -1877.32 ml   Filed Weights   11/03/20 0500 11/04/20 0400 11/05/20 0229  Weight: 112.7 kg 116.7 kg 115 kg    Examination: General: Acute ill appearing adult  Male lying  on mechanical ventilation, in NAD HEENT: ETT, MM pink/moist, PERRL,  Neuro: somnolent but will try to open eyes to voice and will follow commands .  CV: s1s2 regular rate and rhythm, no murmur, rubs, or gallops,  PULM:  Clear to ascultation, no added breath sounds, tolerating vent well  GI: soft but distended, copious NGT output, flexiseal with liquid stool. Extremities: warm/dry, 3+ non-pitting edema  Skin: no rashes or lesions  Resolved Hospital Problem list   Acute kidney injury  Assessment & Plan:   Critically ill due to asystolic cardiac arrest requiring TTM.  Showing signs of purposeful movement indicating possibility of new reasonable neurological recovery -Continue targeted temperature management, maintain temperature less than 37 5 -Wean precedex to off.  Acute hypoxic and hypercapnia respiratory failure  -Tolerating pressure support respiration. -   Extubate once awake.   Cardiogenic shock -  post arrest, improved  -Continue to diurese  Possible gastric ileus with episode of emesis last night. -Continue NGT suction.   Best practice:  Diet: NPO -   Pain/Anxiety/Delirium protocol (if indicated): Wean propofol off VAP protocol (if indicated): Yes DVT prophylaxis: Lovenox GI prophylaxis: Pepcid Glucose control: N/A Mobility: Bedrest last date of multidisciplinary goals of care  discussion: 11/23 Family and staff present: Lawson Fiscal (mom) and Toma Copier (sister)  Summary of discussion: GOC discussion and updated given to mother and sister evening of 11/23. Family was informed of current plan of care and projection. Family agreed to continue current treatment with hopefully watching and waiting for neurological recovery.  Follow up goals of care discussion due: 11/26 Code Status: Full code Family Communication: We will update patient's family  Disposition: ICU  Labs   CBC: Recent Labs  Lab 10/31/20 1501 10/31/20 1506 11/01/20 0414 11/01/20 0417 11/02/20 0336 11/03/20 0549 11/03/20 0807 11/04/20 0446 11/05/20 0355  WBC 20.2*   < > 21.1*  --  11.3* 7.4  --  7.0 9.2  NEUTROABS 9.3*  --   --   --   --   --   --   --   --   HGB 17.7*   < > 20.5*   < > 18.3* 15.4 15.0 15.3 13.5  HCT 57.8*   < > 60.7*   < > 53.0* 46.0 44.0 45.5 42.1  MCV 98.8   < > 89.7  --  87.5 89.8  --  88.5 90.9  PLT 223   < > 267  --  239 154  --  151 131*   < > = values in this interval not displayed.    Basic Metabolic Panel: Recent Labs  Lab 11/01/20 0414 11/01/20 0417 11/01/20 1328 11/01/20 1336 11/02/20 0336 11/03/20 0549 11/03/20 0807 11/04/20 0446 11/05/20 0355  NA 140   < > 139   < > 140 138 137 137 139  K 2.9*   < > 4.1   < > 4.1 3.0* 2.9* 3.6 3.4*  CL 114*   < > 109  --  108 97*  --  98 101  CO2 14*   < > 16*  --  20* 26  --  24 23  GLUCOSE 174*   < > 137*  --  162* 153*  --  111* 93  BUN 23*   < > 28*  --  34* 36*  --  37* 40*  CREATININE 1.94*   < > 2.53*  --  2.02* 1.24  --  1.11 1.18  CALCIUM 7.3*   < > 7.1*  --  7.3* 7.4*  --  8.3* 8.0*  MG 1.5*  --   --   --  1.7  --   --   --   --   PHOS 1.3*  --   --   --  3.8  --   --   --   --    < > = values in this interval not displayed.   GFR: Estimated Creatinine Clearance: 116.1 mL/min (by C-G formula based on SCr of 1.18 mg/dL). Recent Labs  Lab 10/31/20 1501 10/31/20 1808 11/01/20 0414 11/02/20 0336 11/03/20 0549  11/04/20 0446 11/05/20 0355  WBC 20.2*  --    < > 11.3* 7.4 7.0 9.2  LATICACIDVEN 10.1* 6.1*  --   --   --   --   --    < > = values in this interval not displayed.  Liver Function Tests: Recent Labs  Lab 10/31/20 1501 11/04/20 0446 11/05/20 0355  AST 42* 58* 34  ALT 26 133* 82*  ALKPHOS 53 52 44  BILITOT 0.7 1.4* 1.4*  PROT 5.0* 5.1* 4.9*  ALBUMIN 2.7* 2.0* 1.8*   No results for input(s): LIPASE, AMYLASE in the last 168 hours. No results for input(s): AMMONIA in the last 168 hours.  ABG    Component Value Date/Time   PHART 7.601 (HH) 11/03/2020 0807   PCO2ART 29.7 (L) 11/03/2020 0807   PO2ART 73 (L) 11/03/2020 0807   HCO3 29.3 (H) 11/03/2020 0807   TCO2 30 11/03/2020 0807   ACIDBASEDEF 6.0 (H) 11/01/2020 1336   O2SAT 97.0 11/03/2020 0807     Coagulation Profile: No results for input(s): INR, PROTIME in the last 168 hours.  Cardiac Enzymes: No results for input(s): CKTOTAL, CKMB, CKMBINDEX, TROPONINI in the last 168 hours.  HbA1C: No results found for: HGBA1C  CBG: Recent Labs  Lab 11/04/20 0743 11/04/20 1144 11/04/20 1607 11/04/20 2318 11/05/20 0330  GLUCAP 106* 114* 100* 72 76   CRITICAL CARE Performed by: Lynnell Catalan  Total critical care time: 40 minutes  Critical care time was exclusive of separately billable procedures and treating other patients.  Critical care was necessary to treat or prevent imminent or life-threatening deterioration.  Critical care was time spent personally by me on the following activities: development of treatment plan with patient and/or surrogate as well as nursing, discussions with consultants, evaluation of patient's response to treatment, examination of patient, obtaining history from patient or surrogate, ordering and performing treatments and interventions, ordering and review of laboratory studies, ordering and review of radiographic studies, pulse oximetry and re-evaluation of patient's condition.  Lynnell Catalan, MD [facility] ICU Physician [facility] [place] Critical Care  Pager: [phone] Or Epic Secure Chat After hours: [phone].  11/05/2020, 10:14 AM      11/05/2020, 10:14 AM

## 2020-11-05 NOTE — Plan of Care (Signed)
?  Problem: Clinical Measurements: ?Goal: Ability to maintain clinical measurements within normal limits will improve ?Outcome: Progressing ?Goal: Will remain free from infection ?Outcome: Progressing ?Goal: Diagnostic test results will improve ?Outcome: Progressing ?  ?

## 2020-11-05 NOTE — Plan of Care (Signed)
?  Problem: Clinical Measurements: ?Goal: Will remain free from infection ?Outcome: Progressing ?Goal: Diagnostic test results will improve ?Outcome: Progressing ?Goal: Respiratory complications will improve ?Outcome: Progressing ?Goal: Cardiovascular complication will be avoided ?Outcome: Progressing ?  ?Problem: Coping: ?Goal: Level of anxiety will decrease ?Outcome: Progressing ?  ?Problem: Elimination: ?Goal: Will not experience complications related to bowel motility ?Outcome: Progressing ?Goal: Will not experience complications related to urinary retention ?Outcome: Progressing ?  ?Problem: Pain Managment: ?Goal: General experience of comfort will improve ?Outcome: Progressing ?  ?Problem: Safety: ?Goal: Ability to remain free from injury will improve ?Outcome: Progressing ?  ?Problem: Skin Integrity: ?Goal: Risk for impaired skin integrity will decrease ?Outcome: Progressing ?  ?Problem: Education: ?Goal: Knowledge of General Education information will improve ?Description: Including pain rating scale, medication(s)/side effects and non-pharmacologic comfort measures ?Outcome: Not Progressing ?  ?Problem: Health Behavior/Discharge Planning: ?Goal: Ability to manage health-related needs will improve ?Outcome: Not Progressing ?  ?Problem: Clinical Measurements: ?Goal: Ability to maintain clinical measurements within normal limits will improve ?Outcome: Not Progressing ?  ?Problem: Activity: ?Goal: Risk for activity intolerance will decrease ?Outcome: Not Progressing ?  ?Problem: Nutrition: ?Goal: Adequate nutrition will be maintained ?Outcome: Not Progressing ?  ?

## 2020-11-06 ENCOUNTER — Inpatient Hospital Stay (HOSPITAL_COMMUNITY): Payer: Self-pay

## 2020-11-06 ENCOUNTER — Encounter (HOSPITAL_COMMUNITY): Payer: Self-pay | Admitting: Internal Medicine

## 2020-11-06 ENCOUNTER — Other Ambulatory Visit: Payer: Self-pay

## 2020-11-06 LAB — BASIC METABOLIC PANEL
Anion gap: 15 (ref 5–15)
Anion gap: 10 (ref 5–15)
BUN: 35 mg/dL — ABNORMAL HIGH (ref 6–20)
BUN: 32 mg/dL — ABNORMAL HIGH (ref 6–20)
CO2: 21 mmol/L — ABNORMAL LOW (ref 22–32)
CO2: 25 mmol/L (ref 22–32)
Calcium: 7.8 mg/dL — ABNORMAL LOW (ref 8.9–10.3)
Calcium: 8.2 mg/dL — ABNORMAL LOW (ref 8.9–10.3)
Chloride: 106 mmol/L (ref 98–111)
Chloride: 110 mmol/L (ref 98–111)
Creatinine, Ser: 1.26 mg/dL — ABNORMAL HIGH (ref 0.61–1.24)
Creatinine, Ser: 1.02 mg/dL (ref 0.61–1.24)
GFR, Estimated: >60 mL/min (ref >60)
GFR, Estimated: >60 mL/min (ref >60)
Glucose, Bld: 96 mg/dL (ref 70–99)
Glucose, Bld: 110 mg/dL — ABNORMAL HIGH (ref 70–99)
Potassium: 2.9 mmol/L — ABNORMAL LOW (ref 3.5–5.1)
Potassium: 3.5 mmol/L (ref 3.5–5.1)
Sodium: 142 mmol/L (ref 135–145)
Sodium: 145 mmol/L (ref 135–145)

## 2020-11-06 LAB — CBC
HCT: 36.4 % — ABNORMAL LOW (ref 39.0–52.0)
Hemoglobin: 11.5 g/dL — ABNORMAL LOW (ref 13.0–17.0)
MCH: 29.1 pg (ref 26.0–34.0)
MCHC: 31.6 g/dL (ref 30.0–36.0)
MCV: 92.2 fL (ref 80.0–100.0)
Platelets: 119 10*3/uL — ABNORMAL LOW (ref 150–400)
RBC: 3.95 MIL/uL — ABNORMAL LOW (ref 4.22–5.81)
RDW: 13.6 % (ref 11.5–15.5)
WBC: 10.5 10*3/uL (ref 4.0–10.5)
nRBC: 0.2 % (ref 0.0–0.2)

## 2020-11-06 LAB — GLUCOSE, CAPILLARY
Glucose-Capillary: 105 mg/dL — ABNORMAL HIGH (ref 70–99)
Glucose-Capillary: 100 mg/dL — ABNORMAL HIGH (ref 70–99)
Glucose-Capillary: 87 mg/dL (ref 70–99)
Glucose-Capillary: 82 mg/dL (ref 70–99)
Glucose-Capillary: 91 mg/dL (ref 70–99)
Glucose-Capillary: 102 mg/dL — ABNORMAL HIGH (ref 70–99)

## 2020-11-06 LAB — MAGNESIUM
Magnesium: 2.2 mg/dL (ref 1.7–2.4)
Magnesium: 2.5 mg/dL — ABNORMAL HIGH (ref 1.7–2.4)

## 2020-11-06 MED ORDER — HALOPERIDOL LACTATE 5 MG/ML IJ SOLN
5.0000 mg | Freq: Once | INTRAMUSCULAR | Status: AC
  Administered 2020-11-06: 5 mg via INTRAVENOUS
  Filled 2020-11-06: qty 1

## 2020-11-06 MED ORDER — PREDNISONE 20 MG PO TABS: 30.0000 mg | ORAL_TABLET | Freq: Every day | ORAL | Status: DC

## 2020-11-06 MED ORDER — VITAL HIGH PROTEIN PO LIQD
1000.0000 mL | ORAL | Status: DC
  Administered 2020-11-06 – 2020-11-07 (×2): 1000 mL

## 2020-11-06 MED ORDER — FENTANYL CITRATE (PF) 100 MCG/2ML IJ SOLN
25.0000 ug | INTRAMUSCULAR | Status: DC | PRN
  Administered 2020-11-07 – 2020-11-08 (×4): 25 ug via INTRAVENOUS
  Filled 2020-11-06 (×4): qty 2

## 2020-11-06 MED ORDER — ATROPINE SULFATE 1 MG/10ML IJ SOSY
PREFILLED_SYRINGE | INTRAMUSCULAR | Status: AC
  Filled 2020-11-06: qty 10

## 2020-11-06 MED ORDER — PREDNISONE 10 MG PO TABS: 15.0000 mg | ORAL_TABLET | Freq: Every day | ORAL | Status: DC

## 2020-11-06 MED ORDER — CHLORHEXIDINE GLUCONATE 0.12 % MT SOLN
15.0000 mL | Freq: Two times a day (BID) | OROMUCOSAL | Status: DC
  Administered 2020-11-06 – 2020-11-18 (×19): 15 mL via OROMUCOSAL
  Filled 2020-11-06 (×21): qty 15

## 2020-11-06 MED ORDER — POTASSIUM CHLORIDE 10 MEQ/50ML IV SOLN
10.0000 meq | INTRAVENOUS | Status: AC
  Administered 2020-11-06 (×8): 10 meq via INTRAVENOUS
  Filled 2020-11-06 (×8): qty 50

## 2020-11-06 MED ORDER — QUETIAPINE FUMARATE 50 MG PO TABS
50.0000 mg | ORAL_TABLET | Freq: Two times a day (BID) | ORAL | Status: DC
  Administered 2020-11-06: 50 mg via ORAL
  Filled 2020-11-06: qty 1

## 2020-11-06 MED ORDER — ORAL CARE MOUTH RINSE
15.0000 mL | Freq: Two times a day (BID) | OROMUCOSAL | Status: DC
  Administered 2020-11-06 – 2020-11-18 (×17): 15 mL via OROMUCOSAL

## 2020-11-06 NOTE — Plan of Care (Signed)
  Problem: Health Behavior/Discharge Planning: Goal: Ability to manage health-related needs will improve Outcome: Progressing   Problem: Clinical Measurements: Goal: Ability to maintain clinical measurements within normal limits will improve Outcome: Progressing Goal: Respiratory complications will improve Outcome: Progressing   Problem: Nutrition: Goal: Adequate nutrition will be maintained Outcome: Progressing   Problem: Coping: Goal: Level of anxiety will decrease Outcome: Progressing   Problem: Safety: Goal: Ability to remain free from injury will improve Outcome: Progressing

## 2020-11-06 NOTE — Procedures (Signed)
Extubation Procedure Note  Patient Details:   Name: Terrence Henry DOB: 11/19/1981 MRN: [id number]   Airway Documentation:    Vent end date: 11/06/20 Vent end time: 0818   Evaluation  O2 sats: stable throughout Complications: No apparent complications Patient did tolerate procedure well. Bilateral Breath Sounds: Clear, Diminished   No,  Prior to extubation, pt did have a positive cuff leak. Pt was extubated to [facility]. Pt unable to follow commands or state his name. No stridor noted. RT will continue to monitor pt.  Megan Mans 11/06/2020, 8:22 AM

## 2020-11-06 NOTE — Progress Notes (Signed)
NAME:  Terrence Henry, MRN:  [id number], DOB:  1981-04-10, LOS: 6 ADMISSION DATE:  10/31/2020, CONSULTATION DATE: 10/31/2020 REFERRING MD: Dr. Deanna Artis, CHIEF COMPLAINT: Cardiac arrest  Brief History   39 year old male with history of polysubstance abuse presented after he was found down in the bathroom with needles around, was given Narcan by family before EMS arrived.  Unknown downtime.  ROSC achieved after 20 minutes of CPR in the ED  History of present illness   39 year old male with polysubstance abuse who presented in the emergency department cardiac arrest.  Patient is intubated so history is taken from emergency department note. Per EMS report patient was found in the bathroom with needles around him, family gave him Narcan before EMS arrived, CPR was initiated per ACLS protocol.  Initial rhythm was asystole, he was given epinephrine x5, then he had V. fib rhythm and followed by PEA arrest, ROSC was achieved after 20 minutes of CPR.  He was intubated.  Postintubation ABG showed pH of 6.9. His EKG showed left bundle branch block, on bedside echo patient had septal wall motion abnormalities.  Cardiology was called they recommend doing stat echocardiogram.  Past Medical History  Polysubstance abuse Inguinal hernia  Significant Hospital Events   Extubated 11/27  Consults:  Cardiology PCCM Neurology   Procedures:  11/21 ETT  Significant Diagnostic Tests:  11/21 CT head > neg acute  11/21 2D echo >EF 40-45%, global hypokinesis, grade 1 diastolic dysfunction   Micro Data:  COVID 11/21 > negative  MRSA PCR 11/21 > negative Blood culture 11/22 > Staphylococcus species seen in 1/2 bottles    Antimicrobials:  None  Interim history/subjective:   Off sedation patient has episodes of agitation but will occasionally follow commands. NG output has diminished.  Objective   Blood pressure 108/60, pulse 61, temperature 98.2 F (36.8 C), temperature source Oral, resp.  rate (!) 35, height 6\' 3"  (1.905 m), weight 105.2 kg, SpO2 98 %. CVP:  [7 mmHg] 7 mmHg  Vent Mode: PRVC FiO2 (%):  [40 %] 40 % Set Rate:  [35 bmp] 35 bmp Vt Set:  [580 mL] 580 mL PEEP:  [5 cmH20] 5 cmH20 Plateau Pressure:  [23 cmH20-24 cmH20] 24 cmH20   Intake/Output Summary (Last 24 hours) at 11/06/2020 1426 Last data filed at 11/06/2020 1200 Gross per 24 hour  Intake 803.73 ml  Output 2638 ml  Net -1834.27 ml   Filed Weights   11/04/20 0400 11/05/20 0229 11/06/20 0441  Weight: 116.7 kg 115 kg 105.2 kg    Examination: General: Acute ill appearing adult  Male lying  on mechanical ventilation, in NAD HEENT: ETT, MM pink/moist, PERRL,  Neuro: somnolent but will try to open eyes to voice and will follow commands . Move all limbs with antigravity strength CV: s1s2 regular rate and rhythm, no murmur, rubs, or gallops,  PULM:  Clear to ascultation, no added breath sounds, tolerating vent well  GI: soft and less distended, NG output has slowed down, flexiseal with liquid stool. Extremities: warm/dry, 3+ non-pitting edema  Skin: no rashes or lesions  Resolved Hospital Problem list   Acute kidney injury  Assessment & Plan:   Critically ill due to asystolic cardiac arrest requiring TTM.  Showing signs of purposeful movement indicating possibility of new reasonable neurological recovery -Discontinue targeted temperature management -Stop all sedative infusions if possible -Haloperidol as needed for agitation once extubated  Hypoxic ischemic encephalopathy. Has made significant recovery and is ready for extubation. However there may still  be some impairment in cognitive function  Acute hypoxic and hypercapnia respiratory failure  -Extubate today  Cardiogenic shock - post arrest, improved  -Continue to diurese  Possible gastric ileus with episode of emesis last night. Appears to be improving -NGT to gravity. -Initiate trickle feeds if no further NG drainage.  Hypokalemia due to  NG drainage. -Replete.  Best practice:  Diet: NPO -we will start trickle feed Pain/Anxiety/Delirium protocol (if indicated): Precedex infusion, wean to off, as needed haloperidol.  VAP protocol (if indicated): Yes DVT prophylaxis: Lovenox GI prophylaxis: Pepcid Glucose control: Currently euglycemic with no therapy Mobility: Bedrest last date of multidisciplinary goals of care discussion: 11/23 Family and staff present: Lawson Fiscal (mom) and Toma Copier (sister)  Summary of discussion: GOC discussion and updated given to mother and sister evening of 11/23. Family was informed of current plan of care and projection. Family agreed to continue current treatment with hopefully watching and waiting for neurological recovery.  Follow up goals of care discussion due: 11/28 Code Status: Full code Family Communication: We will update patient's family  Disposition: ICU  Labs   CBC: Recent Labs  Lab 10/31/20 1501 10/31/20 1506 11/02/20 0336 11/02/20 0336 11/03/20 0549 11/03/20 0807 11/04/20 0446 11/05/20 0355 11/06/20 0346  WBC 20.2*   < > 11.3*  --  7.4  --  7.0 9.2 10.5  NEUTROABS 9.3*  --   --   --   --   --   --   --   --   HGB 17.7*   < > 18.3*   < > 15.4 15.0 15.3 13.5 11.5*  HCT 57.8*   < > 53.0*   < > 46.0 44.0 45.5 42.1 36.4*  MCV 98.8   < > 87.5  --  89.8  --  88.5 90.9 92.2  PLT 223   < > 239  --  154  --  151 131* 119*   < > = values in this interval not displayed.    Basic Metabolic Panel: Recent Labs  Lab 11/01/20 0414 11/01/20 0417 11/02/20 0336 11/02/20 0336 11/03/20 0549 11/03/20 0807 11/04/20 0446 11/05/20 0355 11/06/20 0346  NA 140   < > 140   < > 138 137 137 139 142  K 2.9*   < > 4.1   < > 3.0* 2.9* 3.6 3.4* 2.9*  CL 114*   < > 108  --  97*  --  98 101 106  CO2 14*   < > 20*  --  26  --  24 23 21*  GLUCOSE 174*   < > 162*  --  153*  --  111* 93 96  BUN 23*   < > 34*  --  36*  --  37* 40* 35*  CREATININE 1.94*   < > 2.02*  --  1.24  --  1.11 1.18 1.26*  CALCIUM  7.3*   < > 7.3*  --  7.4*  --  8.3* 8.0* 7.8*  MG 1.5*  --  1.7  --   --   --   --   --  2.2  PHOS 1.3*  --  3.8  --   --   --   --   --   --    < > = values in this interval not displayed.   GFR: Estimated Creatinine Clearance: 104.3 mL/min (A) (by C-G formula based on SCr of 1.26 mg/dL (H)). Recent Labs  Lab 10/31/20 1501 10/31/20 1808 11/01/20 0414 11/03/20 0549 11/04/20  2951 11/05/20 0355 11/06/20 0346  WBC 20.2*  --    < > 7.4 7.0 9.2 10.5  LATICACIDVEN 10.1* 6.1*  --   --   --   --   --    < > = values in this interval not displayed.    Liver Function Tests: Recent Labs  Lab 10/31/20 1501 11/04/20 0446 11/05/20 0355  AST 42* 58* 34  ALT 26 133* 82*  ALKPHOS 53 52 44  BILITOT 0.7 1.4* 1.4*  PROT 5.0* 5.1* 4.9*  ALBUMIN 2.7* 2.0* 1.8*   No results for input(s): LIPASE, AMYLASE in the last 168 hours. No results for input(s): AMMONIA in the last 168 hours.  ABG    Component Value Date/Time   PHART 7.601 (HH) 11/03/2020 0807   PCO2ART 29.7 (L) 11/03/2020 0807   PO2ART 73 (L) 11/03/2020 0807   HCO3 29.3 (H) 11/03/2020 0807   TCO2 30 11/03/2020 0807   ACIDBASEDEF 6.0 (H) 11/01/2020 1336   O2SAT 97.0 11/03/2020 0807     Coagulation Profile: No results for input(s): INR, PROTIME in the last 168 hours.  Cardiac Enzymes: No results for input(s): CKTOTAL, CKMB, CKMBINDEX, TROPONINI in the last 168 hours.  HbA1C: No results found for: HGBA1C  CBG: Recent Labs  Lab 11/05/20 2025 11/06/20 0112 11/06/20 0452 11/06/20 0823 11/06/20 1142  GLUCAP 81 105* 100* 87 82   CRITICAL CARE Performed by: Lynnell Catalan  Total critical care time: 40 minutes  Critical care time was exclusive of separately billable procedures and treating other patients.  Critical care was necessary to treat or prevent imminent or life-threatening deterioration.  Critical care was time spent personally by me on the following activities: development of treatment plan with patient  and/or surrogate as well as nursing, discussions with consultants, evaluation of patient's response to treatment, examination of patient, obtaining history from patient or surrogate, ordering and performing treatments and interventions, ordering and review of laboratory studies, ordering and review of radiographic studies, pulse oximetry and re-evaluation of patient's condition.  Lynnell Catalan, MD [facility] ICU Physician [facility] [place] Critical Care  Pager: [phone] Or Epic Secure Chat After hours: [phone].  11/06/2020, 2:26 PM

## 2020-11-06 NOTE — Progress Notes (Signed)
eLink Physician-Brief Progress Note Patient Name: Terrence Henry DOB: [birth year] MRN: [id number]   Date of Service  11/06/2020  HPI/Events of Note  Patient has been having brief (5 - 30 secs) bradycardia to the 30s, then spontaneously resolves. Never symptomatic. Not hypotensive. He is on Precedex but requires this to remain calm.   eICU Interventions  Etiology of this patient's intermittent bradycardia is overwhelmingly likely to be excess parasympathetic tone. No intervention is required.  Maintain Precedex at dose required to keep patient calm / RASS 0 to -1. Patient can be extubated on Precedex so there is no necessity to wean this prior to AM at which time he is anticipated to be extubated.     Intervention Category Intermediate Interventions: Arrhythmia - evaluation and management  Marveen Reeks Kimisha Eunice 11/06/2020, 4:22 AM

## 2020-11-07 ENCOUNTER — Encounter (HOSPITAL_COMMUNITY): Payer: Self-pay | Admitting: Internal Medicine

## 2020-11-07 LAB — BASIC METABOLIC PANEL
Anion gap: 11 (ref 5–15)
BUN: 27 mg/dL — ABNORMAL HIGH (ref 6–20)
CO2: 25 mmol/L (ref 22–32)
Calcium: 8.1 mg/dL — ABNORMAL LOW (ref 8.9–10.3)
Chloride: 109 mmol/L (ref 98–111)
Creatinine, Ser: 0.94 mg/dL (ref 0.61–1.24)
GFR, Estimated: >60 mL/min (ref >60)
Glucose, Bld: 120 mg/dL — ABNORMAL HIGH (ref 70–99)
Potassium: 3.5 mmol/L (ref 3.5–5.1)
Sodium: 145 mmol/L (ref 135–145)

## 2020-11-07 LAB — GLUCOSE, CAPILLARY
Glucose-Capillary: 100 mg/dL — ABNORMAL HIGH (ref 70–99)
Glucose-Capillary: 102 mg/dL — ABNORMAL HIGH (ref 70–99)
Glucose-Capillary: 103 mg/dL — ABNORMAL HIGH (ref 70–99)
Glucose-Capillary: 104 mg/dL — ABNORMAL HIGH (ref 70–99)
Glucose-Capillary: 82 mg/dL (ref 70–99)
Glucose-Capillary: 87 mg/dL (ref 70–99)

## 2020-11-07 MED ORDER — POLYETHYLENE GLYCOL 3350 17 G PO PACK: 17.0000 g | PACK | Freq: Every day | ORAL | Status: DC | PRN

## 2020-11-07 MED ORDER — FUROSEMIDE 10 MG/ML IJ SOLN: 40.0000 mg | Freq: Once | INTRAMUSCULAR | Status: AC

## 2020-11-07 MED ORDER — FUROSEMIDE 10 MG/ML IJ SOLN
INTRAMUSCULAR | Status: AC
  Administered 2020-11-07: 40 mg via INTRAVENOUS
  Filled 2020-11-07: qty 4

## 2020-11-07 MED ORDER — HALOPERIDOL LACTATE 5 MG/ML IJ SOLN
2.0000 mg | INTRAMUSCULAR | Status: AC | PRN
  Administered 2020-11-07 (×2): 2 mg via INTRAVENOUS
  Administered 2020-11-07 – 2020-11-08 (×6): 4 mg via INTRAVENOUS
  Filled 2020-11-07 (×9): qty 1

## 2020-11-07 MED ORDER — LOPERAMIDE HCL 1 MG/7.5ML PO SUSP
4.0000 mg | ORAL | Status: DC | PRN
  Administered 2020-11-07 – 2020-11-09 (×2): 4 mg
  Filled 2020-11-07 (×3): qty 30

## 2020-11-07 MED ORDER — QUETIAPINE FUMARATE 100 MG PO TABS
100.0000 mg | ORAL_TABLET | Freq: Two times a day (BID) | ORAL | Status: DC
  Administered 2020-11-07 – 2020-11-14 (×13): 100 mg
  Filled 2020-11-07 (×15): qty 1

## 2020-11-07 MED ORDER — DOCUSATE SODIUM 50 MG/5ML PO LIQD: 100.0000 mg | Freq: Two times a day (BID) | ORAL | Status: DC | PRN

## 2020-11-07 MED ORDER — QUETIAPINE FUMARATE 100 MG PO TABS
100.0000 mg | ORAL_TABLET | Freq: Two times a day (BID) | ORAL | Status: DC
  Administered 2020-11-07: 100 mg via ORAL
  Filled 2020-11-07: qty 1

## 2020-11-07 MED ORDER — POTASSIUM CHLORIDE 20 MEQ PO PACK
40.0000 meq | PACK | Freq: Once | ORAL | Status: AC
  Administered 2020-11-07: 40 meq
  Filled 2020-11-07: qty 2

## 2020-11-07 NOTE — Progress Notes (Signed)
Patient has continued to be extremely agitated and restless since my shift at 0700 today. Pt is on 1.2 precedex, continuous drip, administered 4mg  halodol, 100 of seroquel, repositioned multiple times, used music, therapeutic touch, emotional support and patient is still actively thrashing in the bed attempting to pull out his NG tube. Pt is also in 4 point restraints and mitts and still is able to periodically break his restraints loose and immediately goes for feeding tube. Continue to monitor. 

## 2020-11-07 NOTE — Progress Notes (Addendum)
NAME:  Terrence Henry, MRN:  [id number], DOB:  [birth year], LOS: 7 ADMISSION DATE:  10/31/2020, CONSULTATION DATE: 10/31/2020 REFERRING MD: Dr. Deanna Artis, CHIEF COMPLAINT: Cardiac arrest  Brief History   39 year old male with history of polysubstance abuse presented after he was found down in the bathroom with needles around, was given Narcan by family before EMS arrived.  Unknown downtime.  ROSC achieved after 20 minutes of CPR in the ED  History of present illness   39 year old male with polysubstance abuse who presented in the emergency department cardiac arrest.  Patient is intubated so history is taken from emergency department note. Per EMS report patient was found in the bathroom with needles around him, family gave him Narcan before EMS arrived, CPR was initiated per ACLS protocol.  Initial rhythm was asystole, he was given epinephrine x5, then he had V. fib rhythm and followed by PEA arrest, ROSC was achieved after 20 minutes of CPR.  He was intubated.  Postintubation ABG showed pH of 6.9. His EKG showed left bundle branch block, on bedside echo patient had septal wall motion abnormalities.  Cardiology was called they recommend doing stat echocardiogram.  Past Medical History  Polysubstance abuse Inguinal hernia  Significant Hospital Events   Extubated 11/27  Consults:  Cardiology PCCM Neurology   Procedures:  11/21 ETT  Significant Diagnostic Tests:  11/21 CT head > neg acute  11/21 2D echo >EF 40-45%, global hypokinesis, grade 1 diastolic dysfunction   Micro Data:  COVID 11/21 > negative  MRSA PCR 11/21 > negative Blood culture 11/22 > Staphylococcus species seen in 1/2 bottles    Antimicrobials:  None  Interim history/subjective:   Extubated yesterday.  Remains agitated but will follow commands and is momentarily redirectable.  Objective   Blood pressure (!) 148/97, pulse 94, temperature 98.9 F (37.2 C), temperature source Oral, resp. rate 13,  height 6\' 3"  (1.905 m), weight 103.2 kg, SpO2 92 %.        Intake/Output Summary (Last 24 hours) at 11/07/2020 1232 Last data filed at 11/07/2020 0900 Gross per 24 hour  Intake 1338.85 ml  Output 2200 ml  Net -861.15 ml   Filed Weights   11/05/20 0229 11/06/20 0441 11/07/20 0500  Weight: 115 kg 105.2 kg 103.2 kg    Examination: General: Acute ill appearing adult, in restraints rolling around. HEENT: ETT, MM pink/moist, PERRL, NG tube in place Neuro: somnolent but will try to open eyes to voice and will follow commands . Move all limbs with antigravity strength CV: s1s2 regular rate and rhythm, no murmur, rubs, or gallops,  PULM:  Clear to ascultation, no added breath sounds, occasional upper airway secretions which will clear with coughing. GI: soft and less distended, NG output has slowed down, flexiseal with liquid stool. Extremities: warm/dry, 3+ non-pitting edema  Skin: no rashes or lesions  Resolved Hospital Problem list   Acute kidney injury Acute hypoxic and hypercapnia respiratory failure Possible gastric ileus with episode of emesis last night. Appears to be improving Cardiogenic shock - post arrest, improved   Assessment & Plan:   Critically ill due to asystolic cardiac arrest requiring TTM. Hypoxic ischemic encephalopathy, he has made significant recovery, however there may still be some impairment in cognitive function  -Seroquel increased -Continue as needed Haldol -Follow QTC -Wean dexmedetomidine to off -Attempt to demedicalize as much as possible to relieve delirium   Best practice:  Diet: NPO -on trickle feed will -  advance feeds as tolerated Pain/Anxiety/Delirium protocol (if  indicated): Precedex infusion, wean to off, as needed haloperidol.  Seroquel increased VAP protocol (if indicated): Yes DVT prophylaxis: Lovenox GI prophylaxis: We will stop Pepcid.  Loperamide to decrease diarrhea Glucose control: Currently euglycemic with no therapy Mobility:  Bedrest last date of multidisciplinary goals of care discussion: 11/23 Family and staff present: Lawson Fiscal (mom) and Toma Copier (sister)  Summary of discussion: GOC discussion and updated given to mother and sister evening of 11/23. Family was informed of current plan of care and projection. Family agreed to continue current treatment with hopefully watching and waiting for neurological recovery.  Follow up goals of care discussion due: 11/28 Code Status: Full code Family Communication: We will update patient's family  Disposition: ICU  Labs   CBC: Recent Labs  Lab 10/31/20 1501 10/31/20 1506 11/02/20 0336 11/02/20 0336 11/03/20 0549 11/03/20 0807 11/04/20 0446 11/05/20 0355 11/06/20 0346  WBC 20.2*   < > 11.3*  --  7.4  --  7.0 9.2 10.5  NEUTROABS 9.3*  --   --   --   --   --   --   --   --   HGB 17.7*   < > 18.3*   < > 15.4 15.0 15.3 13.5 11.5*  HCT 57.8*   < > 53.0*   < > 46.0 44.0 45.5 42.1 36.4*  MCV 98.8   < > 87.5  --  89.8  --  88.5 90.9 92.2  PLT 223   < > 239  --  154  --  151 131* 119*   < > = values in this interval not displayed.    Basic Metabolic Panel: Recent Labs  Lab 11/01/20 0414 11/01/20 0417 11/02/20 0336 11/03/20 0549 11/04/20 0446 11/05/20 0355 11/06/20 0346 11/06/20 1946 11/07/20 0315  NA 140   < > 140   < > 137 139 142 145 145  K 2.9*   < > 4.1   < > 3.6 3.4* 2.9* 3.5 3.5  CL 114*   < > 108   < > 98 101 106 110 109  CO2 14*   < > 20*   < > 24 23 21* 25 25  GLUCOSE 174*   < > 162*   < > 111* 93 96 110* 120*  BUN 23*   < > 34*   < > 37* 40* 35* 32* 27*  CREATININE 1.94*   < > 2.02*   < > 1.11 1.18 1.26* 1.02 0.94  CALCIUM 7.3*   < > 7.3*   < > 8.3* 8.0* 7.8* 8.2* 8.1*  MG 1.5*  --  1.7  --   --   --  2.2 2.5*  --   PHOS 1.3*  --  3.8  --   --   --   --   --   --    < > = values in this interval not displayed.   GFR: Estimated Creatinine Clearance: 138.7 mL/min (by C-G formula based on SCr of 0.94 mg/dL). Recent Labs  Lab 10/31/20 1501  10/31/20 1808 11/01/20 0414 11/03/20 0549 11/04/20 0446 11/05/20 0355 11/06/20 0346  WBC 20.2*  --    < > 7.4 7.0 9.2 10.5  LATICACIDVEN 10.1* 6.1*  --   --   --   --   --    < > = values in this interval not displayed.    Liver Function Tests: Recent Labs  Lab 10/31/20 1501 11/04/20 0446 11/05/20 0355  AST 42* 58* 34  ALT 26  133* 82*  ALKPHOS 53 52 44  BILITOT 0.7 1.4* 1.4*  PROT 5.0* 5.1* 4.9*  ALBUMIN 2.7* 2.0* 1.8*   No results for input(s): LIPASE, AMYLASE in the last 168 hours. No results for input(s): AMMONIA in the last 168 hours.  ABG    Component Value Date/Time   PHART 7.601 (HH) 11/03/2020 0807   PCO2ART 29.7 (L) 11/03/2020 0807   PO2ART 73 (L) 11/03/2020 0807   HCO3 29.3 (H) 11/03/2020 0807   TCO2 30 11/03/2020 0807   ACIDBASEDEF 6.0 (H) 11/01/2020 1336   O2SAT 97.0 11/03/2020 0807     Coagulation Profile: No results for input(s): INR, PROTIME in the last 168 hours.  Cardiac Enzymes: No results for input(s): CKTOTAL, CKMB, CKMBINDEX, TROPONINI in the last 168 hours.  HbA1C: No results found for: HGBA1C  CBG: Recent Labs  Lab 11/06/20 1515 11/06/20 2001 11/06/20 2344 11/07/20 0323 11/07/20 0731  GLUCAP 91 102* 82 102* 87   CRITICAL CARE Performed by: Lynnell Catalan  Total critical care time: 35 minutes  Critical care time was exclusive of separately billable procedures and treating other patients.  Critical care was necessary to treat or prevent imminent or life-threatening deterioration.  Critical care was time spent personally by me on the following activities: development of treatment plan with patient and/or surrogate as well as nursing, discussions with consultants, evaluation of patient's response to treatment, examination of patient, obtaining history from patient or surrogate, ordering and performing treatments and interventions, ordering and review of laboratory studies, ordering and review of radiographic studies, pulse oximetry  and re-evaluation of patient's condition.  Lynnell Catalan, MD [facility] ICU Physician [facility] [place] Critical Care  Pager: [phone] Or Epic Secure Chat After hours: [phone].  11/07/2020, 12:32 PM

## 2020-11-07 NOTE — Progress Notes (Addendum)
eLink Physician-Brief Progress Note Patient Name: Terrence Henry DOB: 1981-01-10 MRN: [id number]   Date of Service  11/07/2020  HPI/Events of Note  RN stopped Precedex due to bradycardia. Now patient having more agitation. Request for Haldol instead.   eICU Interventions  Haldol 2-4mg  IV Q4H PRN agitation ordered x 2 days.  Also ordered bilateral ankle soft restraints as requested by RN due to patient kicking in setting of delirium.     Intervention Category Minor Interventions: Agitation / anxiety - evaluation and management  Janae Bridgeman 11/07/2020, 5:29 AM

## 2020-11-08 ENCOUNTER — Inpatient Hospital Stay (HOSPITAL_COMMUNITY): Payer: Self-pay

## 2020-11-08 DIAGNOSIS — R410 Disorientation, unspecified: Secondary | ICD-10-CM

## 2020-11-08 DIAGNOSIS — R739 Hyperglycemia, unspecified: Secondary | ICD-10-CM

## 2020-11-08 LAB — COMPREHENSIVE METABOLIC PANEL
ALT: 39 U/L (ref 0–44)
AST: 24 U/L (ref 15–41)
Albumin: 2.1 g/dL — ABNORMAL LOW (ref 3.5–5.0)
Alkaline Phosphatase: 74 U/L (ref 38–126)
Anion gap: 9 (ref 5–15)
BUN: 24 mg/dL — ABNORMAL HIGH (ref 6–20)
CO2: 24 mmol/L (ref 22–32)
Calcium: 8.3 mg/dL — ABNORMAL LOW (ref 8.9–10.3)
Chloride: 112 mmol/L — ABNORMAL HIGH (ref 98–111)
Creatinine, Ser: 0.94 mg/dL (ref 0.61–1.24)
GFR, Estimated: >60 mL/min (ref >60)
Glucose, Bld: 109 mg/dL — ABNORMAL HIGH (ref 70–99)
Potassium: 3.5 mmol/L (ref 3.5–5.1)
Sodium: 145 mmol/L (ref 135–145)
Total Bilirubin: 1 mg/dL (ref 0.3–1.2)
Total Protein: 5.3 g/dL — ABNORMAL LOW (ref 6.5–8.1)

## 2020-11-08 LAB — CBC
HCT: 37.5 % — ABNORMAL LOW (ref 39.0–52.0)
Hemoglobin: 11.9 g/dL — ABNORMAL LOW (ref 13.0–17.0)
MCH: 29.7 pg (ref 26.0–34.0)
MCHC: 31.7 g/dL (ref 30.0–36.0)
MCV: 93.5 fL (ref 80.0–100.0)
Platelets: 127 10*3/uL — ABNORMAL LOW (ref 150–400)
RBC: 4.01 MIL/uL — ABNORMAL LOW (ref 4.22–5.81)
RDW: 13.5 % (ref 11.5–15.5)
WBC: 13.6 10*3/uL — ABNORMAL HIGH (ref 4.0–10.5)
nRBC: 0 % (ref 0.0–0.2)

## 2020-11-08 LAB — GLUCOSE, CAPILLARY
Glucose-Capillary: 43 mg/dL — CL (ref 70–99)
Glucose-Capillary: 93 mg/dL (ref 70–99)
Glucose-Capillary: 102 mg/dL — ABNORMAL HIGH (ref 70–99)
Glucose-Capillary: 110 mg/dL — ABNORMAL HIGH (ref 70–99)
Glucose-Capillary: 115 mg/dL — ABNORMAL HIGH (ref 70–99)
Glucose-Capillary: 119 mg/dL — ABNORMAL HIGH (ref 70–99)
Glucose-Capillary: 111 mg/dL — ABNORMAL HIGH (ref 70–99)

## 2020-11-08 MED ORDER — OSMOLITE 1.5 CAL PO LIQD
1000.0000 mL | ORAL | Status: AC
  Administered 2020-11-08 – 2020-11-13 (×4): 1000 mL
  Filled 2020-11-08 (×8): qty 1000

## 2020-11-08 MED ORDER — POTASSIUM CHLORIDE 20 MEQ PO PACK
40.0000 meq | PACK | Freq: Once | ORAL | Status: AC
  Administered 2020-11-08: 40 meq
  Filled 2020-11-08: qty 2

## 2020-11-08 MED ORDER — PROSOURCE TF PO LIQD
90.0000 mL | Freq: Four times a day (QID) | ORAL | Status: DC
  Administered 2020-11-08 – 2020-11-12 (×16): 90 mL
  Filled 2020-11-08 (×18): qty 90

## 2020-11-08 MED ORDER — CLONAZEPAM 0.5 MG PO TABS
1.0000 mg | ORAL_TABLET | Freq: Two times a day (BID) | ORAL | Status: DC
  Administered 2020-11-08 – 2020-11-14 (×12): 1 mg
  Filled 2020-11-08: qty 2
  Filled 2020-11-08: qty 1
  Filled 2020-11-08 (×2): qty 2
  Filled 2020-11-08 (×2): qty 1
  Filled 2020-11-08 (×5): qty 2
  Filled 2020-11-08 (×2): qty 1

## 2020-11-08 NOTE — Progress Notes (Signed)
Nutrition Follow Up  DOCUMENTATION CODES:   Not applicable  INTERVENTION:   Replace NG with Cortrak Wednesday   Consider addition of prokinetic agent/advancement of tube post pyloric if vomiting continues   Tube feeding:  -Osmolite 1.5 @ 30 ml/hr via NG -Increase by 10 ml Q8 hours to goal rate of 70 ml/hr (1680 ml) -ProSource TF 45 ml QID  Provides: 2680 kcals, 149 grams protein, 1280 ml free water.   NUTRITION DIAGNOSIS:   Increased nutrient needs related to acute illness as evidenced by estimated needs.  Ongoing  GOAL:   Patient will meet greater than or equal to 90% of their needs   Addressed via TF  MONITOR:   Vent status, Skin, TF tolerance, Weight trends, Labs, I & O's  REASON FOR ASSESSMENT:   Ventilator    ASSESSMENT:   Patient with PMH significant for polysubstance use and inguinal hernia. Presents this admission with asystolic cardiac arrest 2/2 to suspect OD.   11/27- extubated   Pt discussed during ICU rounds and with RN.   Following some commands. Unable to participate in RD questions. Awaiting neurologic recovery at this time. NG placed on 11/26 given concern for gastric ileus. NG output minimal yesterday so trickle Vital High Protein started at 20 ml/hr. Pt tolerating today. Transition formula to better meet needs. Titrate to goal.   Plan to replace NG with Cortrak Wednesday.   Admission weight: 106.7 kg  Current weight: 102.8 kg   UOP: 4450 ml x 24 hrs  Stool: 800 ml x 24 hrs   Drips: precedex  Labs: CBG 93-115  Diet Order:   Diet Order            Diet NPO time specified  Diet effective now                 EDUCATION NEEDS:   Not appropriate for education at this time  Skin:  Skin Assessment: Reviewed RN Assessment  Last BM:  11/28  Height:   Ht Readings from Last 1 Encounters:  10/31/20 6\' 3"  (1.905 m)    Weight:   Wt Readings from Last 1 Encounters:  11/08/20 102.8 kg    BMI:  Body mass index is 28.33  kg/m.  Estimated Nutritional Needs:   Kcal:  2500-2700 kcal  Protein:  125-150 grams  Fluid:  >/= 2 L/day  11/10/20 RD, LDN Clinical Nutrition Pager listed in AMION

## 2020-11-08 NOTE — Progress Notes (Signed)
NAME:  Terrence Henry, MRN:  [id number], DOB:  [birth year], LOS: 8 ADMISSION DATE:  10/31/2020, CONSULTATION DATE: 10/31/2020 REFERRING MD: Dr. Deanna Artis, CHIEF COMPLAINT: Cardiac arrest  Brief History   39 year old male with history of polysubstance abuse presented after he was found down in the bathroom with needles around, was given Narcan by family before EMS arrived.  Unknown downtime.  ROSC achieved after 20 minutes of CPR in the ED  History of present illness   39 year old male with polysubstance abuse who presented in the emergency department cardiac arrest.  Patient is intubated so history is taken from emergency department note. Per EMS report patient was found in the bathroom with needles around him, family gave him Narcan before EMS arrived, CPR was initiated per ACLS protocol.  Initial rhythm was asystole, he was given epinephrine x5, then he had V. fib rhythm and followed by PEA arrest, ROSC was achieved after 20 minutes of CPR.  He was intubated.  Postintubation ABG showed pH of 6.9. His EKG showed left bundle branch block, on bedside echo patient had septal wall motion abnormalities.  Cardiology was called they recommend doing stat echocardiogram.  Past Medical History  Polysubstance abuse Inguinal hernia  Significant Hospital Events   Extubated 11/27  Consults:  Cardiology PCCM Neurology   Procedures:  11/21 ETT  Significant Diagnostic Tests:  11/21 CT head > neg acute  11/21 2D echo >EF 40-45%, global hypokinesis, grade 1 diastolic dysfunction   Micro Data:  COVID 11/21 > negative  MRSA PCR 11/21 > negative Blood culture 11/22 > Staphylococcus species seen in 1/2 bottles    Antimicrobials:  None  Interim history/subjective:  Remains confused, requiring sedatives to avoid agitation.  Received frequent doses of fentanyl overnight last night.   Objective   Blood pressure (!) 141/82, pulse 75, temperature 99.8 F (37.7 C), temperature source  Axillary, resp. rate (!) 24, height 6\' 3"  (1.905 m), weight 102.8 kg, SpO2 96 %.        Intake/Output Summary (Last 24 hours) at 11/08/2020 1135 Last data filed at 11/08/2020 1000 Gross per 24 hour  Intake 1251.81 ml  Output 5300 ml  Net -4048.19 ml   Filed Weights   11/06/20 0441 11/07/20 0500 11/08/20 0500  Weight: 105.2 kg 103.2 kg 102.8 kg    Examination: General: Ill-appearing young man lying in bed no acute distress HEENT: [place]/AT, eyes anicteric, NGT in place Neuro: RASS -3, is able to track but falls back asleep quickly.  Not following commands. CV: Regular rate and rhythm, no murmurs PULM: Clear to auscultation bilaterally, breathing comfortably on nasal cannula GI: Soft, nontender, nondistended.  Flexi-Seal in place with liquid stool Extremities: Dependent edema, no clubbing or cyanosis Skin: No rashes  CXR personally reviewed-low lung volumes, no opacities  Resolved Hospital Problem list   Acute kidney injury Acute hypoxic and hypercapnia respiratory failure Possible gastric ileus with episode of emesis last night. Appears to be improving Cardiogenic shock - post arrest, improved   Assessment & Plan:   Asystolic cardiac arrest requiring TTM. Hypoxic ischemic encephalopathy, he has made significant recovery, however there may still be some impairment in cognitive function -Continue Seroquel -Continue Haldol as needed.  Adding Ativan due to concern for potential benzodiazepine withdrawal -Continue to follow QTC periodically -Titrate off Precedex -Out of bed mobility when able -Remove lines and tubes as much as possible -frequent reorientation  Mild hyperglycemia -Continue Accu-Cheks with sliding scale as needed  Azotemia-improving -Continue to monitor  Leukocytosis -Continue  to monitor -Evaluate for potential source of infection -Re-culture  -Restart antibiotics if persistent fevers  Acute anemia, likely due to critical illness -Transfuse for  hemoglobin less than 7 or hemodynamically significant bleeding -Continue to monitor  Best practice:  Diet: NPO -on trickle feed will -  advance feeds as tolerated Pain/Anxiety/Delirium protocol (if indicated): Precedex infusion, wean to off, as needed haloperidol.  Seroquel increased VAP protocol (if indicated): Yes DVT prophylaxis: Lovenox GI prophylaxis: We will stop Pepcid.  Loperamide to decrease diarrhea Glucose control: Currently euglycemic with no therapy Mobility: Bedrest last date of multidisciplinary goals of care discussion: 11/23 Family and staff present: Lawson Fiscal (mom) and Toma Copier (sister)  Summary of discussion: GOC discussion and updated given to mother and sister evening of 11/23. Family was informed of current plan of care and projection. Family agreed to continue current treatment with hopefully watching and waiting for neurological recovery.  Follow up goals of care discussion due: 11/28 Code Status: Full code Family Communication:  Disposition: ICU  Labs   CBC: Recent Labs  Lab 11/03/20 0549 11/03/20 0549 11/03/20 0807 11/04/20 0446 11/05/20 0355 11/06/20 0346 11/08/20 0430  WBC 7.4  --   --  7.0 9.2 10.5 13.6*  HGB 15.4   < > 15.0 15.3 13.5 11.5* 11.9*  HCT 46.0   < > 44.0 45.5 42.1 36.4* 37.5*  MCV 89.8  --   --  88.5 90.9 92.2 93.5  PLT 154  --   --  151 131* 119* 127*   < > = values in this interval not displayed.    Basic Metabolic Panel: Recent Labs  Lab 11/02/20 0336 11/03/20 0549 11/05/20 0355 11/06/20 0346 11/06/20 1946 11/07/20 0315 11/08/20 0430  NA 140   < > 139 142 145 145 145  K 4.1   < > 3.4* 2.9* 3.5 3.5 3.5  CL 108   < > 101 106 110 109 112*  CO2 20*   < > 23 21* 25 25 24   GLUCOSE 162*   < > 93 96 110* 120* 109*  BUN 34*   < > 40* 35* 32* 27* 24*  CREATININE 2.02*   < > 1.18 1.26* 1.02 0.94 0.94  CALCIUM 7.3*   < > 8.0* 7.8* 8.2* 8.1* 8.3*  MG 1.7  --   --  2.2 2.5*  --   --   PHOS 3.8  --   --   --   --   --   --    < > =  values in this interval not displayed.   GFR: Estimated Creatinine Clearance: 138.4 mL/min (by C-G formula based on SCr of 0.94 mg/dL). Recent Labs  Lab 11/04/20 0446 11/05/20 0355 11/06/20 0346 11/08/20 0430  WBC 7.0 9.2 10.5 13.6*    Liver Function Tests: Recent Labs  Lab 11/04/20 0446 11/05/20 0355 11/08/20 0430  AST 58* 34 24  ALT 133* 82* 39  ALKPHOS 52 44 74  BILITOT 1.4* 1.4* 1.0  PROT 5.1* 4.9* 5.3*  ALBUMIN 2.0* 1.8* 2.1*   No results for input(s): LIPASE, AMYLASE in the last 168 hours. No results for input(s): AMMONIA in the last 168 hours.  ABG    Component Value Date/Time   PHART 7.601 (HH) 11/03/2020 0807   PCO2ART 29.7 (L) 11/03/2020 0807   PO2ART 73 (L) 11/03/2020 0807   HCO3 29.3 (H) 11/03/2020 0807   TCO2 30 11/03/2020 0807   ACIDBASEDEF 6.0 (H) 11/01/2020 1336   O2SAT 97.0 11/03/2020 11/05/2020  Coagulation Profile: No results for input(s): INR, PROTIME in the last 168 hours.  Cardiac Enzymes: No results for input(s): CKTOTAL, CKMB, CKMBINDEX, TROPONINI in the last 168 hours.  HbA1C: No results found for: HGBA1C  CBG: Recent Labs  Lab 11/07/20 1536 11/07/20 2005 11/07/20 2335 11/08/20 0357 11/08/20 0802  GLUCAP 100* 104* 103* 93 102*     This patient is critically ill with multiple organ system failure which requires frequent high complexity decision making, assessment, support, evaluation, and titration of therapies. This was completed through the application of advanced monitoring technologies and extensive interpretation of multiple databases. During this encounter critical care time was devoted to patient care services described in this note for 38 minutes.  Steffanie Dunn, DO 11/08/20 8:43 PM [facility]

## 2020-11-09 DIAGNOSIS — G9340 Encephalopathy, unspecified: Secondary | ICD-10-CM

## 2020-11-09 DIAGNOSIS — J9601 Acute respiratory failure with hypoxia: Secondary | ICD-10-CM

## 2020-11-09 LAB — COMPREHENSIVE METABOLIC PANEL
ALT: 29 U/L (ref 0–44)
AST: 18 U/L (ref 15–41)
Albumin: 2.3 g/dL — ABNORMAL LOW (ref 3.5–5.0)
Alkaline Phosphatase: 74 U/L (ref 38–126)
Anion gap: 12 (ref 5–15)
BUN: 18 mg/dL (ref 6–20)
CO2: 19 mmol/L — ABNORMAL LOW (ref 22–32)
Calcium: 8.4 mg/dL — ABNORMAL LOW (ref 8.9–10.3)
Chloride: 115 mmol/L — ABNORMAL HIGH (ref 98–111)
Creatinine, Ser: 0.85 mg/dL (ref 0.61–1.24)
GFR, Estimated: >60 mL/min (ref >60)
Glucose, Bld: 129 mg/dL — ABNORMAL HIGH (ref 70–99)
Potassium: 4 mmol/L (ref 3.5–5.1)
Sodium: 146 mmol/L — ABNORMAL HIGH (ref 135–145)
Total Bilirubin: 0.9 mg/dL (ref 0.3–1.2)
Total Protein: 5.8 g/dL — ABNORMAL LOW (ref 6.5–8.1)

## 2020-11-09 LAB — CBC
HCT: 38.4 % — ABNORMAL LOW (ref 39.0–52.0)
Hemoglobin: 12.6 g/dL — ABNORMAL LOW (ref 13.0–17.0)
MCH: 30.1 pg (ref 26.0–34.0)
MCHC: 32.8 g/dL (ref 30.0–36.0)
MCV: 91.9 fL (ref 80.0–100.0)
Platelets: 176 10*3/uL (ref 150–400)
RBC: 4.18 MIL/uL — ABNORMAL LOW (ref 4.22–5.81)
RDW: 13.7 % (ref 11.5–15.5)
WBC: 11.6 10*3/uL — ABNORMAL HIGH (ref 4.0–10.5)
nRBC: 0 % (ref 0.0–0.2)

## 2020-11-09 LAB — GLUCOSE, CAPILLARY
Glucose-Capillary: 105 mg/dL — ABNORMAL HIGH (ref 70–99)
Glucose-Capillary: 107 mg/dL — ABNORMAL HIGH (ref 70–99)
Glucose-Capillary: 108 mg/dL — ABNORMAL HIGH (ref 70–99)
Glucose-Capillary: 111 mg/dL — ABNORMAL HIGH (ref 70–99)
Glucose-Capillary: 128 mg/dL — ABNORMAL HIGH (ref 70–99)
Glucose-Capillary: 132 mg/dL — ABNORMAL HIGH (ref 70–99)

## 2020-11-09 LAB — MAGNESIUM: Magnesium: 2.2 mg/dL (ref 1.7–2.4)

## 2020-11-09 LAB — PHOSPHORUS: Phosphorus: 2.5 mg/dL (ref 2.5–4.6)

## 2020-11-09 MED ORDER — PIPERACILLIN-TAZOBACTAM 3.375 G IVPB 30 MIN
3.3750 g | Freq: Once | INTRAVENOUS | Status: AC
  Administered 2020-11-09: 3.375 g via INTRAVENOUS
  Filled 2020-11-09: qty 50

## 2020-11-09 MED ORDER — ADULT MULTIVITAMIN W/MINERALS CH: 1.0000 | ORAL_TABLET | Freq: Every day | ORAL | Status: DC

## 2020-11-09 MED ORDER — ADULT MULTIVITAMIN LIQUID CH
15.0000 mL | Freq: Every day | ORAL | Status: DC
  Administered 2020-11-09 – 2020-11-12 (×4): 15 mL
  Filled 2020-11-09 (×4): qty 15

## 2020-11-09 MED ORDER — VANCOMYCIN HCL 2000 MG/400ML IV SOLN
2000.0000 mg | Freq: Once | INTRAVENOUS | Status: AC
  Administered 2020-11-09: 2000 mg via INTRAVENOUS
  Filled 2020-11-09: qty 400

## 2020-11-09 MED ORDER — LOPERAMIDE HCL 1 MG/7.5ML PO SUSP
4.0000 mg | Freq: Two times a day (BID) | ORAL | Status: AC
  Administered 2020-11-09: 4 mg
  Filled 2020-11-09: qty 30

## 2020-11-09 MED ORDER — THIAMINE HCL 100 MG PO TABS
100.0000 mg | ORAL_TABLET | Freq: Every day | ORAL | Status: DC
  Administered 2020-11-09 – 2020-11-17 (×8): 100 mg
  Filled 2020-11-09 (×8): qty 1

## 2020-11-09 MED ORDER — MAGNESIUM SULFATE 2 GM/50ML IV SOLN
2.0000 g | Freq: Once | INTRAVENOUS | Status: AC
  Administered 2020-11-09: 2 g via INTRAVENOUS
  Filled 2020-11-09: qty 50

## 2020-11-09 MED ORDER — VANCOMYCIN HCL IN DEXTROSE 1-5 GM/200ML-% IV SOLN
1000.0000 mg | Freq: Three times a day (TID) | INTRAVENOUS | Status: DC
  Administered 2020-11-09 – 2020-11-11 (×7): 1000 mg via INTRAVENOUS
  Filled 2020-11-09 (×9): qty 200

## 2020-11-09 MED ORDER — FOLIC ACID 1 MG PO TABS
1.0000 mg | ORAL_TABLET | Freq: Every day | ORAL | Status: DC
  Administered 2020-11-09 – 2020-11-17 (×9): 1 mg
  Filled 2020-11-09 (×9): qty 1

## 2020-11-09 MED ORDER — FOLIC ACID 1 MG PO TABS: 1.0000 mg | ORAL_TABLET | Freq: Every day | ORAL | Status: DC

## 2020-11-09 MED ORDER — POTASSIUM CHLORIDE 20 MEQ/15ML (10%) PO SOLN
40.0000 meq | Freq: Once | ORAL | Status: AC
  Administered 2020-11-09: 40 meq
  Filled 2020-11-09: qty 30

## 2020-11-09 MED ORDER — THIAMINE HCL 100 MG/ML IJ SOLN
100.0000 mg | Freq: Every day | INTRAMUSCULAR | Status: DC
  Administered 2020-11-12: 100 mg via INTRAVENOUS
  Filled 2020-11-09 (×2): qty 2

## 2020-11-09 MED ORDER — LORAZEPAM 2 MG/ML IJ SOLN
0.0000 mg | Freq: Three times a day (TID) | INTRAMUSCULAR | Status: AC
  Administered 2020-11-12: 4 mg via INTRAVENOUS
  Administered 2020-11-12 – 2020-11-13 (×2): 2 mg via INTRAVENOUS
  Filled 2020-11-09 (×2): qty 1
  Filled 2020-11-09: qty 2

## 2020-11-09 MED ORDER — LORAZEPAM 2 MG/ML IJ SOLN
0.0000 mg | INTRAMUSCULAR | Status: AC
  Administered 2020-11-09 – 2020-11-10 (×6): 2 mg via INTRAVENOUS
  Administered 2020-11-11: 1 mg via INTRAVENOUS
  Administered 2020-11-11 (×2): 2 mg via INTRAVENOUS
  Filled 2020-11-09 (×9): qty 1

## 2020-11-09 MED ORDER — THIAMINE HCL 100 MG PO TABS: 100.0000 mg | ORAL_TABLET | Freq: Every day | ORAL | Status: DC

## 2020-11-09 MED ORDER — DOCUSATE SODIUM 50 MG/5ML PO LIQD: 100.0000 mg | Freq: Two times a day (BID) | ORAL | Status: DC | PRN

## 2020-11-09 MED ORDER — THIAMINE HCL 100 MG/ML IJ SOLN: 100.0000 mg | Freq: Every day | INTRAMUSCULAR | Status: DC

## 2020-11-09 MED ORDER — PIPERACILLIN-TAZOBACTAM 3.375 G IVPB
3.3750 g | Freq: Three times a day (TID) | INTRAVENOUS | Status: AC
  Administered 2020-11-09 – 2020-11-13 (×14): 3.375 g via INTRAVENOUS
  Filled 2020-11-09 (×12): qty 50

## 2020-11-09 NOTE — Progress Notes (Addendum)
eLink Physician-Brief Progress Note Patient Name: Terrence Henry DOB: 29-Apr-1981 MRN: [id number]   Date of Service  11/09/2020  HPI/Events of Note  Episodes of agitation despite Precedex [place] on  eICU Interventions  Ordered bilateral soft wrist restraints  Patient reportedly thrashes and kicks Ordered bilateral ankle restraints as well  Bedside team to reassess in am if 4 point restraint necessary        Rosalie Gums Joselynn Amoroso 11/09/2020, 2:08 AM

## 2020-11-09 NOTE — Progress Notes (Signed)
eLink Physician-Brief Progress Note Patient Name: Terrence Henry DOB: 08/18/1981 MRN: [id number]   Date of Service  11/09/2020  HPI/Events of Note  Persistent fever now at 102.9. Blood culture 11/29 NGTD. 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 CXR with suspected LLL pneumonia and right atelectasis  eICU Interventions  Cooling blanket ordered Start vancomycin/piperacillin-tazobactam     Intervention Category Major Interventions: Infection - evaluation and management  Darl Pikes 11/09/2020, 12:32 AM

## 2020-11-09 NOTE — Progress Notes (Signed)
Pharmacy Antibiotic Note  Terrence Henry is a 39 y.o. male admitted on 10/31/2020, now with concern for pneumonia and sepsis.  Pharmacy has been consulted for vancomycin and Zosyn dosing.  Plan: Vancomycin 2000mg  x1 then 1000mg  IV every 8 hours.  Goal trough 15-20 mcg/mL. Zosyn 3.375g IV every 8 hours (4-hour infusion).  Height: 6\' 3"  (190.5 cm) Weight: 102.8 kg (226 lb 10.1 oz) IBW/kg (Calculated) : 84.5  Temp (24hrs), Avg:99.8 F (37.7 C), Min:98.2 F (36.8 C), Max:102.9 F (39.4 C)  Recent Labs  Lab 11/03/20 0549 11/03/20 0549 11/04/20 0446 11/04/20 0446 11/05/20 0355 11/06/20 0346 11/06/20 1946 11/07/20 0315 11/08/20 0430  WBC 7.4  --  7.0  --  9.2 10.5  --   --  13.6*  CREATININE 1.24   < > 1.11   < > 1.18 1.26* 1.02 0.94 0.94   < > = values in this interval not displayed.    Estimated Creatinine Clearance: 138.4 mL/min (by C-G formula based on SCr of 0.94 mg/dL).    No Known Allergies   Thank you for allowing pharmacy to be a part of this patient's care.  11/08/20, PharmD, BCPS  11/09/2020 12:47 AM

## 2020-11-09 NOTE — Progress Notes (Signed)
NAME:  Terrence Henry, MRN:  [id number], DOB:  [birth year], LOS: 9 ADMISSION DATE:  10/31/2020, CONSULTATION DATE: 10/31/2020 REFERRING MD: Dr. Deanna Artis, CHIEF COMPLAINT: Cardiac arrest  Brief History   39 year old male with history of polysubstance abuse presented after he was found down in the bathroom with needles around, was given Narcan by family before EMS arrived.  Unknown downtime.  ROSC achieved after 20 minutes of CPR in the ED  History of present illness   40 year old male with polysubstance abuse who presented in the emergency department cardiac arrest.  Patient is intubated so history is taken from emergency department note. Per EMS report patient was found in the bathroom with needles around him, family gave him Narcan before EMS arrived, CPR was initiated per ACLS protocol.  Initial rhythm was asystole, he was given epinephrine x5, then he had V. fib rhythm and followed by PEA arrest, ROSC was achieved after 20 minutes of CPR.  He was intubated.  Postintubation ABG showed pH of 6.9. His EKG showed left bundle branch block, on bedside echo patient had septal wall motion abnormalities.  Cardiology was called they recommend doing stat echocardiogram.  Past Medical History  Polysubstance abuse Inguinal hernia  Significant Hospital Events   Extubated 11/27  Consults:  Cardiology PCCM Neurology   Procedures:  11/21 ETT  Significant Diagnostic Tests:  11/21 CT head > neg acute  11/21 2D echo >EF 40-45%, global hypokinesis, grade 1 diastolic dysfunction   Micro Data:  COVID 11/21 > negative  MRSA PCR 11/21 > negative Blood culture 11/22 > Staphylococcus species seen in 1/2 bottles    Antimicrobials:  None  Interim history/subjective:  Remains on Precedex, weaned from 1.4 to 0.4  11/30 am for bradycardia ( Into 30's) Follows commands, MAE, but remains non-verbal, does try to mouth words.  T max 102.9, WBC is 13.6 ( up from 10.5), CXR 11/29 with suspected L  pneumonia Platelets 127 K, HGB 11.9  Objective   Blood pressure 120/70, pulse 68, temperature 98.9 F (37.2 C), temperature source Axillary, resp. rate (!) 24, height 6\' 3"  (1.905 m), weight 96.4 kg, SpO2 98 %.        Intake/Output Summary (Last 24 hours) at 11/09/2020 0930 Last data filed at 11/09/2020 0800 Gross per 24 hour  Intake 1587.39 ml  Output 2050 ml  Net -462.61 ml   Filed Weights   11/07/20 0500 11/08/20 0500 11/09/20 0500  Weight: 103.2 kg 102.8 kg 96.4 kg    Examination: General: Ill-appearing young man lying in bed no acute distress HEENT: [place]/AT, eyes anicteric, [place]  in place, No LAD, No JVD Neuro: RASS -1, is able to track but falls back asleep quickly. Follows simple commands  CV: Regular rate and rhythm, no murmurs, brady has resolved PULM: Clear to auscultation bilaterally, breathing comfortably on nasal cannula, diminished per bases GI: Soft, nontender, nondistended.  Flexi-Seal in place with liquid stool Extremities:No obvious deformities,  Dependent edema, no clubbing or cyanosis Skin: No rashes, lesions   CXR 11/29 with Small left pleural effusion with suspected pneumonia left lower lobe.  Resolved Hospital Problem list   Acute kidney injury Acute hypoxic and hypercapnia respiratory failure Possible gastric ileus with episode of emesis last night. Appears to be improving Cardiogenic shock - post arrest, improved   Assessment & Plan:   Asystolic cardiac arrest requiring TTM. Hypoxic ischemic encephalopathy, he has made significant recovery, however there may still be some impairment in cognitive function -Continue Seroquel -Continue Haldol as  needed.  Adding Ativan due to concern for potential benzodiazepine withdrawal -Continue to follow QTC periodically -Titrate off Precedex -Out of bed mobility when able -Remove lines and tubes as much as possible -frequent reorientation  Mild hyperglycemia -Continue Accu-Cheks with sliding scale as  needed  Azotemia-improving -Continue to monitor  Leukocytosis T max of 102.9 on 11/30 -Start vanc and Zosyn as ordered -CXR in am and prn -Re-culture ( Blood Cx 11/29>> follow micro)   Acute anemia, likely due to critical illness -Transfuse for hemoglobin less than 7 or hemodynamically significant bleeding -Continue to monitor for bleeding - Trend CBC  Best practice:  Diet: NPO -on trickle feed will -  advance feeds as tolerated Pain/Anxiety/Delirium protocol (if indicated): Precedex infusion, wean to off, as needed haloperidol.  Seroquel increased VAP protocol (if indicated): Yes DVT prophylaxis: Lovenox GI prophylaxis: We will stop Pepcid.  Loperamide to decrease diarrhea Glucose control: Currently euglycemic with no therapy Mobility: Bedrest last date of multidisciplinary goals of care discussion: 11/23 Family and staff present: Lawson Fiscal (mom) and Toma Copier (sister)  Summary of discussion: GOC discussion and updated given to mother and sister evening of 11/23. Family was informed of current plan of care and projection. Family agreed to continue current treatment with hopefully watching and waiting for neurological recovery.  Follow up goals of care discussion due: 11/28 Code Status: Full code Family Communication:  Disposition: ICU  Labs   CBC: Recent Labs  Lab 11/03/20 0549 11/03/20 0549 11/03/20 0807 11/04/20 0446 11/05/20 0355 11/06/20 0346 11/08/20 0430  WBC 7.4  --   --  7.0 9.2 10.5 13.6*  HGB 15.4   < > 15.0 15.3 13.5 11.5* 11.9*  HCT 46.0   < > 44.0 45.5 42.1 36.4* 37.5*  MCV 89.8  --   --  88.5 90.9 92.2 93.5  PLT 154  --   --  151 131* 119* 127*   < > = values in this interval not displayed.    Basic Metabolic Panel: Recent Labs  Lab 11/05/20 0355 11/06/20 0346 11/06/20 1946 11/07/20 0315 11/08/20 0430  NA 139 142 145 145 145  K 3.4* 2.9* 3.5 3.5 3.5  CL 101 106 110 109 112*  CO2 23 21* 25 25 24   GLUCOSE 93 96 110* 120* 109*  BUN 40* 35* 32* 27*  24*  CREATININE 1.18 1.26* 1.02 0.94 0.94  CALCIUM 8.0* 7.8* 8.2* 8.1* 8.3*  MG  --  2.2 2.5*  --   --    GFR: Estimated Creatinine Clearance: 127.3 mL/min (by C-G formula based on SCr of 0.94 mg/dL). Recent Labs  Lab 11/04/20 0446 11/05/20 0355 11/06/20 0346 11/08/20 0430  WBC 7.0 9.2 10.5 13.6*    Liver Function Tests: Recent Labs  Lab 11/04/20 0446 11/05/20 0355 11/08/20 0430  AST 58* 34 24  ALT 133* 82* 39  ALKPHOS 52 44 74  BILITOT 1.4* 1.4* 1.0  PROT 5.1* 4.9* 5.3*  ALBUMIN 2.0* 1.8* 2.1*   No results for input(s): LIPASE, AMYLASE in the last 168 hours. No results for input(s): AMMONIA in the last 168 hours.  ABG    Component Value Date/Time   PHART 7.601 (HH) 11/03/2020 0807   PCO2ART 29.7 (L) 11/03/2020 0807   PO2ART 73 (L) 11/03/2020 0807   HCO3 29.3 (H) 11/03/2020 0807   TCO2 30 11/03/2020 0807   ACIDBASEDEF 6.0 (H) 11/01/2020 1336   O2SAT 97.0 11/03/2020 0807     Coagulation Profile: No results for input(s): INR, PROTIME in the last  168 hours.  Cardiac Enzymes: No results for input(s): CKTOTAL, CKMB, CKMBINDEX, TROPONINI in the last 168 hours.  HbA1C: No results found for: HGBA1C  CBG: Recent Labs  Lab 11/08/20 1524 11/08/20 1933 11/08/20 2327 11/09/20 0343 11/09/20 0723  GLUCAP 115* 119* 111* 105* 107*   CCM APP Time: 40 minutes  This patient is critically ill with multiple organ system failure which requires frequent high complexity decision making, assessment, support, evaluation, and titration of therapies. This was completed through the application of advanced monitoring technologies and extensive interpretation of multiple databases. During this encounter critical care time was devoted to patient care services described in this note for 40 minutes.  Bevelyn Ngo, MSN, AGACNP-BC [facility] See Amion for personal pager PCCM on call pager [phone] 11/09/2020 9:51 AM

## 2020-11-09 NOTE — Progress Notes (Signed)
CCM via E-Link notified that patient is febrile-Axillary temp of 102.9. Tylenol given and ice packs applied. Vancomycin and Zosyn ordered and given. Notified portable [phone]) that patient is in need of a cooling blanket (none kept on the unit). Per portable none are available at this time. When one is available it will be brought to patient's room and applied-E-link made aware. Until then, will continue with ice packs.

## 2020-11-10 ENCOUNTER — Inpatient Hospital Stay (HOSPITAL_COMMUNITY): Payer: Self-pay

## 2020-11-10 DIAGNOSIS — R197 Diarrhea, unspecified: Secondary | ICD-10-CM

## 2020-11-10 DIAGNOSIS — E872 Acidosis: Secondary | ICD-10-CM

## 2020-11-10 DIAGNOSIS — R451 Restlessness and agitation: Secondary | ICD-10-CM

## 2020-11-10 LAB — MAGNESIUM: Magnesium: 1.9 mg/dL (ref 1.7–2.4)

## 2020-11-10 LAB — GLUCOSE, CAPILLARY
Glucose-Capillary: 95 mg/dL (ref 70–99)
Glucose-Capillary: 114 mg/dL — ABNORMAL HIGH (ref 70–99)
Glucose-Capillary: 123 mg/dL — ABNORMAL HIGH (ref 70–99)

## 2020-11-10 LAB — COMPREHENSIVE METABOLIC PANEL
ALT: 28 U/L (ref 0–44)
AST: 18 U/L (ref 15–41)
Albumin: 2.2 g/dL — ABNORMAL LOW (ref 3.5–5.0)
Alkaline Phosphatase: 67 U/L (ref 38–126)
Anion gap: 12 (ref 5–15)
BUN: 19 mg/dL (ref 6–20)
CO2: 18 mmol/L — ABNORMAL LOW (ref 22–32)
Calcium: 8.2 mg/dL — ABNORMAL LOW (ref 8.9–10.3)
Chloride: 113 mmol/L — ABNORMAL HIGH (ref 98–111)
Creatinine, Ser: 0.85 mg/dL (ref 0.61–1.24)
GFR, Estimated: >60 mL/min (ref >60)
Glucose, Bld: 128 mg/dL — ABNORMAL HIGH (ref 70–99)
Potassium: 3.7 mmol/L (ref 3.5–5.1)
Sodium: 143 mmol/L (ref 135–145)
Total Bilirubin: 1.3 mg/dL — ABNORMAL HIGH (ref 0.3–1.2)
Total Protein: 5.6 g/dL — ABNORMAL LOW (ref 6.5–8.1)

## 2020-11-10 LAB — CBC
HCT: 38.7 % — ABNORMAL LOW (ref 39.0–52.0)
Hemoglobin: 12 g/dL — ABNORMAL LOW (ref 13.0–17.0)
MCH: 29.3 pg (ref 26.0–34.0)
MCHC: 31 g/dL (ref 30.0–36.0)
MCV: 94.4 fL (ref 80.0–100.0)
Platelets: 169 10*3/uL (ref 150–400)
RBC: 4.1 MIL/uL — ABNORMAL LOW (ref 4.22–5.81)
RDW: 13.6 % (ref 11.5–15.5)
WBC: 10.3 10*3/uL (ref 4.0–10.5)
nRBC: 0 % (ref 0.0–0.2)

## 2020-11-10 MED ORDER — SODIUM BICARBONATE 650 MG PO TABS
650.0000 mg | ORAL_TABLET | Freq: Three times a day (TID) | ORAL | Status: DC
  Administered 2020-11-10 – 2020-11-18 (×24): 650 mg
  Filled 2020-11-10 (×25): qty 1

## 2020-11-10 MED ORDER — POTASSIUM CHLORIDE 20 MEQ PO PACK
60.0000 meq | PACK | Freq: Once | ORAL | Status: AC
  Administered 2020-11-10: 60 meq
  Filled 2020-11-10 (×2): qty 3

## 2020-11-10 NOTE — Progress Notes (Signed)
PT Cancellation Note  Patient Details Name: EITHAN BEAGLE MRN: [id number] DOB: [birth year]   Cancelled Treatment:    Reason Eval/Treat Not Completed: Patient not medically ready. Upon PT arrival RN attempting to set up multiple IV lines and tube feeds for patient. RN requests PT return at a later time. PT will follow up as time allows.   Arlyss Gandy 11/10/2020, 4:24 PM

## 2020-11-10 NOTE — Progress Notes (Signed)
NAME:  Terrence Henry, MRN:  [id number], DOB:  11-29-1981, LOS: 10 ADMISSION DATE:  10/31/2020, CONSULTATION DATE: 10/31/2020 REFERRING MD: Dr. Deanna Artis, CHIEF COMPLAINT: Cardiac arrest  Brief History   39 year old male with history of polysubstance abuse presented after he was found down in the bathroom with needles around, was given Narcan by family before EMS arrived.  Unknown downtime.  ROSC achieved after 20 minutes of CPR in the ED  History of present illness   39 year old male with polysubstance abuse who presented in the emergency department cardiac arrest.  Patient is intubated so history is taken from emergency department note. Per EMS report patient was found in the bathroom with needles around him, family gave him Narcan before EMS arrived, CPR was initiated per ACLS protocol.  Initial rhythm was asystole, he was given epinephrine x5, then he had V. fib rhythm and followed by PEA arrest, ROSC was achieved after 20 minutes of CPR.  He was intubated.  Postintubation ABG showed pH of 6.9. His EKG showed left bundle branch block, on bedside echo patient had septal wall motion abnormalities.  Cardiology was called they recommend doing stat echocardiogram.  Past Medical History  Polysubstance abuse Inguinal hernia  Significant Hospital Events   Extubated 11/27  Consults:  Cardiology PCCM Neurology   Procedures:  11/21 ETT  Significant Diagnostic Tests:  11/21 CT head > neg acute  11/21 2D echo >EF 40-45%, global hypokinesis, grade 1 diastolic dysfunction   Micro Data:  COVID 11/21 > negative  MRSA PCR 11/21 > negative Blood culture 11/22 > Staphylococcus species seen in 1/2 bottles   Blood cx 11/29>   Antimicrobials:  Zosyn 11/29> vanc 11/29>  Interim history/subjective:  Off precedex; remains tachycardic since it was stopped. Still large volume stool OP. Fevers lower.   Objective   Blood pressure 127/81, pulse 99, temperature 98.6 F (37 C),  temperature source Oral, resp. rate (!) 29, height 6\' 3"  (1.905 m), weight 95.3 kg, SpO2 96 %.        Intake/Output Summary (Last 24 hours) at 11/10/2020 1028 Last data filed at 11/10/2020 0915 Gross per 24 hour  Intake 1704.1 ml  Output 1650 ml  Net 54.1 ml   Filed Weights   11/08/20 0500 11/09/20 0500 11/10/20 0600  Weight: 102.8 kg 96.4 kg 95.3 kg    Examination: General: Ill-appearing man lying in bed sleeping in no acute distress HEENT: [place]/AT, eyes anicteric.  Core track in place. Neuro: RASS -3, follows commands once aroused.  Not opening his eyes or tracking.  Moving all extremities spontaneously and lifting his head off the bed. CV: Tachycardic, regular rhythm. PULM: Tachypneic, no accessory muscle use.  On room air.  Clear to auscultation bilaterally. GI: Soft, nontender, nondistended.  Flexi-Seal in place with liquid stool.   Extremities: No clubbing, cyanosis, or edema Skin: No rashes or ecchymoses.  Scab without surrounding erythema from removal of IJ central line.  CXR 12/1 personally reviewed-improved left lower lobe opacity.  Possible right upper lobe infiltrate.   KUB> NG tube in stomach.  Ileus.  Resolved Hospital Problem list   Acute kidney injury Acute hypoxic and hypercapnia respiratory failure Possible gastric ileus with episode of emesis last night. Appears to be improving Cardiogenic shock - post arrest, improved   Assessment & Plan:   Asystolic cardiac arrest requiring TTM. Hypoxic ischemic encephalopathy, he has made significant recovery, however there may still be some impairment in cognitive function -Continue Seroquel and clonazepam twice daily due to  concern for benzodiazepine withdrawal. -Continue Haldol as needed.  -Continue to follow QTC periodically -Remains off Precedex today with improved mental status at times -Out of bed mobility when able; PT and OT consult -CVC removed, Foley catheter removed -frequent reorientation --1:1  sitter  Mild hyperglycemia -Continue Accu-Cheks with sliding scale as needed  Azotemia- resolved -Continue to monitor  NAGMA likely due to diarrhea -enteral bicarb supplements  Leukocytosis T max of 102.9 on 11/30-fevers improving.  Unknown source of infection. -Continue Vanc and Zosyn -Continue to follow cultures  Acute anemia, likely due to critical illness -Transfuse for hemoglobin less than 7 or hemodynamically significant bleeding -Continue to monitor for bleeding - Trend CBC  Hyperbilirubinemia, unclear etiology -con't to monitor -if worsening, consider RUQ [place]  Transfer to [place]. TR[facility] to assume care tomorrow.  Best practice:  Diet: NPO -on trickle feed will -  advance feeds as tolerated Pain/Anxiety/Delirium protocol (if indicated): Precedex infusion, wean to off, as needed haloperidol.  Seroquel increased VAP protocol (if indicated): Yes DVT prophylaxis: Lovenox GI prophylaxis: We will stop Pepcid.  Loperamide to decrease diarrhea Glucose control: Currently euglycemic with no therapy Mobility: Bedrest last date of multidisciplinary goals of care discussion: 11/23 Family and staff present: Lawson Fiscal (mom) and Toma Copier (sister)  Summary of discussion: GOC discussion and updated given to mother and sister evening of 11/23. Family was informed of current plan of care and projection. Family agreed to continue current treatment with hopefully watching and waiting for neurological recovery.  Follow up goals of care discussion due: 11/28 Code Status: Full code Family Communication:  Disposition: ICU  Labs   CBC: Recent Labs  Lab 11/05/20 0355 11/06/20 0346 11/08/20 0430 11/09/20 1549 11/10/20 0413  WBC 9.2 10.5 13.6* 11.6* 10.3  HGB 13.5 11.5* 11.9* 12.6* 12.0*  HCT 42.1 36.4* 37.5* 38.4* 38.7*  MCV 90.9 92.2 93.5 91.9 94.4  PLT 131* 119* 127* 176 169    Basic Metabolic Panel: Recent Labs  Lab 11/06/20 0346 11/06/20 0346 11/06/20 1946 11/07/20 0315 11/08/20 0430  11/09/20 1549 11/10/20 0413  NA 142   < > 145 145 145 146* 143  K 2.9*   < > 3.5 3.5 3.5 4.0 3.7  CL 106   < > 110 109 112* 115* 113*  CO2 21*   < > 25 25 24  19* 18*  GLUCOSE 96   < > 110* 120* 109* 129* 128*  BUN 35*   < > 32* 27* 24* 18 19  CREATININE 1.26*   < > 1.02 0.94 0.94 0.85 0.85  CALCIUM 7.8*   < > 8.2* 8.1* 8.3* 8.4* 8.2*  MG 2.2  --  2.5*  --   --  2.2 1.9  PHOS  --   --   --   --   --  2.5  --    < > = values in this interval not displayed.   GFR: Estimated Creatinine Clearance: 140.8 mL/min (by C-G formula based on SCr of 0.85 mg/dL). Recent Labs  Lab 11/06/20 0346 11/08/20 0430 11/09/20 1549 11/10/20 0413  WBC 10.5 13.6* 11.6* 10.3    Liver Function Tests: Recent Labs  Lab 11/04/20 0446 11/05/20 0355 11/08/20 0430 11/09/20 1549 11/10/20 0413  AST 58* 34 24 18 18   ALT 133* 82* 39 29 28  ALKPHOS 52 44 74 74 67  BILITOT 1.4* 1.4* 1.0 0.9 1.3*  PROT 5.1* 4.9* 5.3* 5.8* 5.6*  ALBUMIN 2.0* 1.8* 2.1* 2.3* 2.2*   No results for input(s): LIPASE, AMYLASE  in the last 168 hours. No results for input(s): AMMONIA in the last 168 hours.  ABG    Component Value Date/Time   PHART 7.601 (HH) 11/03/2020 0807   PCO2ART 29.7 (L) 11/03/2020 0807   PO2ART 73 (L) 11/03/2020 0807   HCO3 29.3 (H) 11/03/2020 0807   TCO2 30 11/03/2020 0807   ACIDBASEDEF 6.0 (H) 11/01/2020 1336   O2SAT 97.0 11/03/2020 0807     Coagulation Profile: No results for input(s): INR, PROTIME in the last 168 hours.  Cardiac Enzymes: No results for input(s): CKTOTAL, CKMB, CKMBINDEX, TROPONINI in the last 168 hours.  HbA1C: No results found for: HGBA1C  CBG: Recent Labs  Lab 11/09/20 1101 11/09/20 1649 11/09/20 1936 11/09/20 2342 11/10/20 0333  GLUCAP 108* 132* 111* 128* 95     Steffanie Dunn, DO 11/10/20 11:26 AM [facility]

## 2020-11-10 NOTE — Progress Notes (Signed)
Pt received from 2H. VSS. 4 point restraints in place. Patients mother updated on room transfer via phone. Pt oriented to room and unit.  Versie Starks, RN

## 2020-11-10 NOTE — Procedures (Signed)
Cortrak  Person Inserting Tube:  Terrence Henry, RD Tube Type:  Cortrak - 43 inches Tube Location:  Right nare Initial Placement:  Stomach Secured by: Bridle Technique Used to Measure Tube Placement:  Documented cm marking at nare/ corner of mouth Cortrak Secured At:  72 cm    Cortrak Tube Team Note:  Consult received to place a Cortrak feeding tube.   X-ray is required, abdominal x-ray has been ordered by the Cortrak team. Please confirm tube placement before using the Cortrak tube.   If the tube becomes dislodged please keep the tube and contact the Cortrak team at [email or web address] (password [email or web address]) for replacement.  If after hours and replacement cannot be delayed, place a NG tube and confirm placement with an abdominal x-ray.    Terrence Pacini, MS, RD, LDN Pager number available on Amion

## 2020-11-11 ENCOUNTER — Inpatient Hospital Stay (HOSPITAL_COMMUNITY): Payer: Self-pay

## 2020-11-11 LAB — COMPREHENSIVE METABOLIC PANEL
ALT: 27 U/L (ref 0–44)
AST: 19 U/L (ref 15–41)
Albumin: 2.3 g/dL — ABNORMAL LOW (ref 3.5–5.0)
Alkaline Phosphatase: 71 U/L (ref 38–126)
Anion gap: 8 (ref 5–15)
BUN: 14 mg/dL (ref 6–20)
CO2: 21 mmol/L — ABNORMAL LOW (ref 22–32)
Calcium: 8.2 mg/dL — ABNORMAL LOW (ref 8.9–10.3)
Chloride: 113 mmol/L — ABNORMAL HIGH (ref 98–111)
Creatinine, Ser: 0.65 mg/dL (ref 0.61–1.24)
GFR, Estimated: >60 mL/min (ref >60)
Glucose, Bld: 131 mg/dL — ABNORMAL HIGH (ref 70–99)
Potassium: 3.7 mmol/L (ref 3.5–5.1)
Sodium: 142 mmol/L (ref 135–145)
Total Bilirubin: 0.6 mg/dL (ref 0.3–1.2)
Total Protein: 5.8 g/dL — ABNORMAL LOW (ref 6.5–8.1)

## 2020-11-11 LAB — GLUCOSE, CAPILLARY
Glucose-Capillary: 111 mg/dL — ABNORMAL HIGH (ref 70–99)
Glucose-Capillary: 117 mg/dL — ABNORMAL HIGH (ref 70–99)
Glucose-Capillary: 117 mg/dL — ABNORMAL HIGH (ref 70–99)
Glucose-Capillary: 129 mg/dL — ABNORMAL HIGH (ref 70–99)
Glucose-Capillary: 135 mg/dL — ABNORMAL HIGH (ref 70–99)
Glucose-Capillary: 99 mg/dL (ref 70–99)

## 2020-11-11 LAB — CBC WITH DIFFERENTIAL/PLATELET
Abs Immature Granulocytes: 0.19 10*3/uL — ABNORMAL HIGH (ref 0.00–0.07)
Basophils Absolute: 0.1 10*3/uL (ref 0.0–0.1)
Basophils Relative: 1 %
Eosinophils Absolute: 0.4 10*3/uL (ref 0.0–0.5)
Eosinophils Relative: 4 %
HCT: 38.5 % — ABNORMAL LOW (ref 39.0–52.0)
Hemoglobin: 12.6 g/dL — ABNORMAL LOW (ref 13.0–17.0)
Immature Granulocytes: 2 %
Lymphocytes Relative: 32 %
Lymphs Abs: 3.1 10*3/uL (ref 0.7–4.0)
MCH: 29.9 pg (ref 26.0–34.0)
MCHC: 32.7 g/dL (ref 30.0–36.0)
MCV: 91.2 fL (ref 80.0–100.0)
Monocytes Absolute: 0.6 10*3/uL (ref 0.1–1.0)
Monocytes Relative: 6 %
Neutro Abs: 5.3 10*3/uL (ref 1.7–7.7)
Neutrophils Relative %: 55 %
Platelets: 224 10*3/uL (ref 150–400)
RBC: 4.22 MIL/uL (ref 4.22–5.81)
RDW: 13.4 % (ref 11.5–15.5)
WBC: 9.6 10*3/uL (ref 4.0–10.5)
nRBC: 0 % (ref 0.0–0.2)

## 2020-11-11 LAB — AMMONIA: Ammonia: 44 umol/L — ABNORMAL HIGH (ref 9–35)

## 2020-11-11 LAB — MAGNESIUM: Magnesium: 1.8 mg/dL (ref 1.7–2.4)

## 2020-11-11 LAB — PHOSPHORUS: Phosphorus: 2.9 mg/dL (ref 2.5–4.6)

## 2020-11-11 MED ORDER — POTASSIUM CHLORIDE 20 MEQ PO PACK
20.0000 meq | PACK | Freq: Every day | ORAL | Status: DC
  Administered 2020-11-11 – 2020-11-16 (×6): 20 meq via NASOGASTRIC
  Filled 2020-11-11 (×6): qty 1

## 2020-11-11 MED ORDER — MAGNESIUM SULFATE 2 GM/50ML IV SOLN
2.0000 g | Freq: Once | INTRAVENOUS | Status: AC
  Administered 2020-11-11: 2 g via INTRAVENOUS
  Filled 2020-11-11: qty 50

## 2020-11-11 MED ORDER — LOPERAMIDE HCL 2 MG PO CAPS: 2.0000 mg | ORAL_CAPSULE | Freq: Four times a day (QID) | ORAL | Status: DC | PRN

## 2020-11-11 NOTE — Progress Notes (Signed)
Patient responds to commands to lift his arms and legs. Continues to pick and fidget. Patient able to state name and birthday but unable to answer other questions, states "I do not know". Will answer yes or no questions appropriately when he focuses. Otherwise babbles incoherently.   -Estella Husk, RN

## 2020-11-11 NOTE — Evaluation (Signed)
Physical Therapy Evaluation Patient Details Name: Terrence Henry MRN: [id number] DOB: 05-24-1981 Today's Date: 11/11/2020   History of Present Illness  39 year old male with polysubstance abuse who presented in the emergency department cardiac arrest.  Pt was given Narcan before EMS arrived.  Unknown downtime.  ROSC achieved after 20 minutes of CPR in the ED. Pt was extubated on 11/27   Clinical Impression  Pt with decreased arousal during evaluation. Pt was able to open eyes and inconsistently follow commands. Pt attempted to respond to questions but unable to determine if correct due to poor vocalizations. Pt was mod of 2 for supine<>sit with increased lateral lean L in sitting. Pt became easily agitated in sitting and was returned supine in bed. Pt demonstrating deficits in balance, strength, coordination, endurance, safety and arousal and will benefit from skilled PT to address deficits to maximize independence with functional mobility prior to discharge    Follow Up Recommendations SNF;Supervision/Assistance - 24 hour    Equipment Recommendations  Rolling walker with 5" wheels;Wheelchair (measurements PT);Wheelchair cushion (measurements PT);3in1 (PT)    Recommendations for Other Services       Precautions / Restrictions Precautions Precautions: Fall Precaution Comments: mitts, restraints, NG tube      Mobility  Bed Mobility Overal bed mobility: Needs Assistance Bed Mobility: Supine to Sit;Sit to Supine     Supine to sit: +2 for physical assistance;+2 for safety/equipment;Mod assist Sit to supine: +2 for physical assistance;Mod assist   General bed mobility comments: pt able to inconsistently follow verbal and tactile commands to initiate bed mobility    Transfers                    Ambulation/Gait                Stairs            Wheelchair Mobility    Modified Rankin (Stroke Patients Only)       Balance Overall balance assessment: Needs  assistance Sitting-balance support: Feet supported;Bilateral upper extremity supported Sitting balance-Leahy Scale: Poor Sitting balance - Comments: increased lateral lean L, pt able to activate musculature to correct LOB but unable to maintain or full correct without assist                                     Pertinent Vitals/Pain      Home Living                   Additional Comments: according to mom pt chose to be homeless    Prior Function Level of Independence: Independent               Hand Dominance   Dominant Hand: Right    Extremity/Trunk Assessment                Communication   Communication: No difficulties  Cognition Arousal/Alertness: Suspect due to medications Behavior During Therapy: Restless;Agitated Overall Cognitive Status: Difficult to assess                                 General Comments: will need further assessment. Pt became agitated at end of session      General Comments      Exercises     Assessment/Plan    PT Assessment Patient needs continued PT services  PT  Problem List Decreased strength;Decreased mobility;Decreased safety awareness;Decreased coordination;Decreased activity tolerance;Decreased cognition;Decreased balance;Decreased knowledge of use of DME       PT Treatment Interventions Therapeutic exercise;Gait training;Balance training;Stair training;Neuromuscular re-education;Functional mobility training;Therapeutic activities;Cognitive remediation;Patient/family education    PT Goals (Current goals can be found in the Care Plan section)  Acute Rehab PT Goals Patient Stated Goal: none stated by patient PT Goal Formulation: With family Time For Goal Achievement: 11/25/20 Potential to Achieve Goals: Fair    Frequency Min 3X/week   Barriers to discharge        Co-evaluation PT/OT/SLP Co-Evaluation/Treatment: Yes Reason for Co-Treatment: Complexity of the patient's  impairments (multi-system involvement);Necessary to address cognition/behavior during functional activity;For patient/therapist safety PT goals addressed during session: Mobility/safety with mobility;Balance;Strengthening/ROM OT goals addressed during session: Strengthening/ROM       AM-PAC PT "6 Clicks" Mobility  Outcome Measure Help needed turning from your back to your side while in a flat bed without using bedrails?: A Little Help needed moving from lying on your back to sitting on the side of a flat bed without using bedrails?: A Lot Help needed moving to and from a bed to a chair (including a wheelchair)?: Total Help needed standing up from a chair using your arms (e.g., wheelchair or bedside chair)?: Total Help needed to walk in hospital room?: Total Help needed climbing 3-5 steps with a railing? : Total 6 Click Score: 9    End of Session   Activity Tolerance: Patient limited by lethargy Patient left: in bed;with family/visitor present;with call bell/phone within reach;with restraints reapplied Nurse Communication: Mobility status PT Visit Diagnosis: Unsteadiness on feet (R26.81);Other abnormalities of gait and mobility (R26.89);Muscle weakness (generalized) (M62.81)    Time: 6767-2094 PT Time Calculation (min) (ACUTE ONLY): 19 min   Charges:   PT Evaluation $PT Eval Moderate Complexity: 1 Mod          [name], DPT Acute Rehabilitation Services [phone]  [name] 11/11/2020, 2:37 PM

## 2020-11-11 NOTE — Progress Notes (Signed)
Spoke w/ mom re: Confidential XXX process; she has only 3 family listed that are allowed to visit patient. She understands the visitation policy of only one per day. WE discussed the password set up upon admission;

## 2020-11-11 NOTE — Evaluation (Signed)
Occupational Therapy Evaluation Patient Details Name: Terrence Henry MRN: [id number] DOB: 27-Oct-1981 Today's Date: 11/11/2020    History of Present Illness 39 year old male with polysubstance abuse who presented in the emergency department cardiac arrest.   Clinical Impression   Patient admitted with the above diagnosis.  He was found down in asystole for an unknown period of time.  Limited OT eval this date due to decreased level of alertness.  He presents with the barriers listed below, and is currently total assist.  PTA, he was homeless, but per family needed no assistive devices for mobility, and was independent with ADL.  OT will follow in the acute setting, discharge disposition will depend on functional improvements.      Follow Up Recommendations  SNF;Supervision/Assistance - 24 hour    Equipment Recommendations  3 in 1 bedside commode;Tub/shower bench;Wheelchair (measurements OT);Wheelchair cushion (measurements OT);Hospital bed    Recommendations for Other Services       Precautions / Restrictions Precautions Precautions: Fall Precaution Comments: mitts, restraints, NG tube Restrictions Weight Bearing Restrictions: No Other Position/Activity Restrictions: foley and rectal tube      Mobility Bed Mobility Overal bed mobility: Needs Assistance Bed Mobility: Supine to Sit;Sit to Supine     Supine to sit: +2 for physical assistance;+2 for safety/equipment;Mod assist Sit to supine: +2 for physical assistance;Mod assist   General bed mobility comments: pt able to inconsistently follow verbal and tactile commands to initiate bed mobility    Transfers                      Balance Overall balance assessment: Needs assistance Sitting-balance support: Bilateral upper extremity supported;Feet supported Sitting balance-Leahy Scale: Poor Sitting balance - Comments: increased lateral lean L, pt able to activate musculature to correct LOB but unable to maintain or  full correct without assist Postural control: Posterior lean;Left lateral lean                                 ADL either performed or assessed with clinical judgement   ADL Overall ADL's : Needs assistance/impaired Eating/Feeding: NPO   Grooming: Wash/dry hands;Wash/dry face;Oral care;Total assistance;Bed level   Upper Body Bathing: Total assistance;Bed level   Lower Body Bathing: Total assistance;Bed level   Upper Body Dressing : Total assistance;Bed level   Lower Body Dressing: Total assistance;Bed level   Toilet Transfer: Total assistance Toilet Transfer Details (indicate cue type and reason): rectal tube and foley         Functional mobility during ADLs: +2 for physical assistance;Total assistance General ADL Comments: unable to maintain upright sitting                         Pertinent Vitals/Pain Pain Assessment: Faces Faces Pain Scale: Hurts little more Pain Location: nose Pain Descriptors / Indicators: Moaning;Grimacing Pain Intervention(s): Monitored during session     Hand Dominance Right   Extremity/Trunk Assessment Upper Extremity Assessment Upper Extremity Assessment: Generalized weakness   Lower Extremity Assessment Lower Extremity Assessment: Defer to PT evaluation   Cervical / Trunk Assessment Cervical / Trunk Assessment: Normal   Communication Communication Communication: Other (comment) (wispers and mouths words.)   Cognition Arousal/Alertness: Lethargic Behavior During Therapy: Restless;Agitated Overall Cognitive Status: Difficult to assess  General Comments: will need further assessment. Pt became agitated at end of session   General Comments       Exercises     Shoulder Instructions      Home Living Family/patient expects to be discharged to:: Shelter/Homeless                                 Additional Comments: according to mom pt chose to be  homeless      Prior Functioning/Environment Level of Independence: Independent                 OT Problem List: Decreased strength;Decreased activity tolerance;Impaired balance (sitting and/or standing);Decreased safety awareness;Decreased cognition      OT Treatment/Interventions: Self-care/ADL training;Therapeutic exercise;Neuromuscular education;Balance training;Cognitive remediation/compensation;Therapeutic activities    OT Goals(Current goals can be found in the care plan section) Acute Rehab OT Goals Patient Stated Goal: none stated by patient OT Goal Formulation: Patient unable to participate in goal setting Time For Goal Achievement: 11/25/20 Potential to Achieve Goals: Fair ADL Goals Pt Will Perform Grooming: with mod assist;sitting Pt Will Perform Upper Body Bathing: with mod assist;sitting Pt Will Perform Upper Body Dressing: with mod assist;sitting Additional ADL Goal #1: Patient will roll side to side with min A to increase toileting independence.  OT Frequency: Min 2X/week   Barriers to D/C: Other (comment)  Homeless       Co-evaluation PT/OT/SLP Co-Evaluation/Treatment: Yes Reason for Co-Treatment: Complexity of the patient's impairments (multi-system involvement);Necessary to address cognition/behavior during functional activity;For patient/therapist safety PT goals addressed during session: Mobility/safety with mobility;Balance;Strengthening/ROM OT goals addressed during session: ADL's and self-care      AM-PAC OT "6 Clicks" Daily Activity     Outcome Measure Help from another person eating meals?: Total Help from another person taking care of personal grooming?: Total Help from another person toileting, which includes using toliet, bedpan, or urinal?: Total Help from another person bathing (including washing, rinsing, drying)?: Total Help from another person to put on and taking off regular upper body clothing?: Total Help from another person to put  on and taking off regular lower body clothing?: Total 6 Click Score: 6   End of Session Nurse Communication: Mobility status  Activity Tolerance: Patient limited by lethargy Patient left: in bed;with call bell/phone within reach;with bed alarm set;with family/visitor present;with restraints reapplied  OT Visit Diagnosis: Unsteadiness on feet (R26.81);Muscle weakness (generalized) (M62.81);Other symptoms and signs involving cognitive function;Feeding difficulties (R63.3)                Time: 1350-1405 OT Time Calculation (min): 15 min Charges:  OT General Charges $OT Visit: 1 Visit OT Evaluation $OT Eval Moderate Complexity: 1 Mod  11/11/2020  Terrence Henry, OTR/L  Acute Rehabilitation Services  Office:  [phone]   Terrence Henry 11/11/2020, 3:06 PM

## 2020-11-11 NOTE — Progress Notes (Signed)
PROGRESS NOTE  Terrence Henry  DOB: 06/22/1981  PCP: Patient, No Pcp Per NID:[id number]  DOA: 10/31/2020  LOS: 11 days   Chief complaint: Cardiac arrest  Brief narrative: Terrence Henry is a 39 y.o. male with PMH significant for polysubstance abuse On 10/31/2020, patient was brought to the ED as a cardiac arrest.  He was found unconscious by family in the bathroom with needles around.  Unknown downtime.  Family given Narcan without success before EMS arrived.  EMS achieved ROSC after 20 minutes of CPR, epinephrine x5 and defibrillation. Brought in to the ED with a King airway, in normal sinus rhythm and blood pressure low in 60s.  In the ED, he was intubated for a low GCS of 3, started on IV fluids and Levophed. Per cardiology, primary cause of cardiac arrest was not cardiac in origin and rather likely to be an overdose, hence patient did not have an emergent cardiac cath.  Echocardiogram showed EF of 40 to 45% with global hypokinesis and grade 1 diastolic dysfunction. Admitted to ICU, on mechanical ventilation, therapeutic hypothermia protocol. Extubated on 11/27. Transferred to hospitalist service on 12/2.  Subjective: Patient was seen and examined this morning.  At the time of my evaluation, patient was sedated with meds given earlier.  He had a Dobbhoff tube with feeding ongoing.  Had a Foley catheter and a Flexi-Seal.  He also was in four-point restraints. Chart reviewed. No fever, heart rate in 90s, blood pressure mostly in 130s, breathing on room air. Labs this morning with bicarbonate improved to 21, creatinine normal, phosphorus normal, magnesium slightly low at 1.8  Assessment/Plan: Cardiac arrest   Cardiogenic shock postarrest Acute hypoxic hypercapnic respiratory failure -Cardiac arrest likely due to heroin overdose.  Successfully resuscitated by EMS prior to arrival.   -Initially admitted to ICU, on mechanical ventilation, therapeutic hypothermia protocol. -Required  IV fluid and pressors.  Hemodynamics improved. -Currently normal sinus rhythm, stable blood pressure and breathing in room air.  Hypoxic ischemic encephalopathy Intermittent delirium  -Secondary to unknown downtime during cardiac arrest -Patient seems to have cognitive impairment and goes through intermittent delirium.  Per staff, last night patient was fidgety, restless and required to be in restraints. -While in the ICU, patient required Precedex drip.  It was weaned off.  He is currently on Seroquel and Klonopin to prevent benzodiazepine withdrawal.  Continue the same. -Continue Haldol as needed.  Follow QTC periodically. -Frequent reorientation -Continue one-to-one sitter  Diarrhea Non-anion gap metabolic acidosis -Continue to monitor diarrhea -Serum bicarb level continues to remain low. -Repeat labs today.  Acute kidney injury -Creatinine peaked at 2.53 secondary to shock.  Creatinine improved.  Continue to monitor Recent Labs    11/03/20 0549 11/04/20 0446 11/05/20 0355 11/06/20 0346 11/06/20 1946 11/07/20 0315 11/08/20 0430 11/09/20 1549 11/10/20 0413 11/11/20 0831  BUN 36* 37* 40* 35* 32* 27* 24* 18 19 14   CREATININE 1.24 1.11 1.18 1.26* 1.02 0.94 0.94 0.85 0.85 0.65   Hypokalemia/hypomagnesemia -Potassium 3.7 and magnesium 1.8 today. -Start potassium chloride daily supplement.  Avoid enteric magnesium because of diarrhea.  I would magnesium ordered.  Recheck of labs ordered. Recent Labs  Lab 11/06/20 0346 11/06/20 0346 11/06/20 1946 11/06/20 1946 11/07/20 0315 11/08/20 0430 11/09/20 1549 11/10/20 0413 11/11/20 0831  K 2.9*   < > 3.5   < > 3.5 3.5 4.0 3.7 3.7  MG 2.2  --  2.5*  --   --   --  2.2 1.9 1.8  PHOS  --   --   --   --   --   --  2.5  --  2.9   < > = values in this interval not displayed.   Physical deconditioning -Out of bed. PT/OT eval.  Polysubstance abuse -Counseled to quit  SIRS -On 11/30, had a fever of 102.9. -Unclear source of  infection -Blood culture sent.  Patient was started on broader spectrum antibiotic with vancomycin and Zosyn. -Afebrile in last 24 hours.  I will stop vancomycin and continue IV Zosyn. -Continue to monitor.  Hypoalbuminemia -Albumin level 1.8.  Nutrition consulted.  Currently on tube feeding.  Mobility: Needs PT evaluation once more awake Code Status:   Code Status: Full Code  Nutritional status: Body mass index is 26.43 kg/m. Nutrition Problem: Increased nutrient needs Etiology: acute illness Signs/Symptoms: estimated needs Diet Order            Diet NPO time specified  Diet effective now                DVT prophylaxis: enoxaparin (LOVENOX) injection 40 mg Start: 10/31/20 1630   Antimicrobials:  IV Zosyn Fluid: None Consultants: Critical care signed off Family Communication:  None at bedside  Status is: Inpatient  Remains inpatient appropriate because: Remains altered  Dispo: The patient is from: Home              Anticipated d/c is to: Likely will need nursing home              Anticipated d/c date is: > 3 days              Patient currently is not medically stable to d/c.       Infusions:  . sodium chloride Stopped (11/06/20 0639)  . feeding supplement (OSMOLITE 1.5 CAL) 1,000 mL (11/10/20 1632)  . magnesium sulfate bolus IVPB    . piperacillin-tazobactam (ZOSYN)  IV 3.375 g (11/11/20 1525)    Scheduled Meds: . chlorhexidine  15 mL Mouth Rinse BID  . Chlorhexidine Gluconate Cloth  6 each Topical Daily  . clonazePAM  1 mg Per Tube BID  . enoxaparin (LOVENOX) injection  40 mg Subcutaneous Q24H  . feeding supplement (PROSource TF)  90 mL Per Tube QID  . fiber  1 packet Per Tube BID  . folic acid  1 mg Per Tube Daily  . LORazepam  0-4 mg Intravenous Q8H  . mouth rinse  15 mL Mouth Rinse q12n4p  . multivitamin  15 mL Per Tube Daily  . potassium chloride  20 mEq Per NG tube Daily  . QUEtiapine  100 mg Per Tube BID  . sodium bicarbonate  650 mg Per Tube  TID  . thiamine  100 mg Per Tube Daily   Or  . thiamine  100 mg Intravenous Daily    Antimicrobials: Anti-infectives (From admission, onward)   Start     Dose/Rate Route Frequency Ordered Stop   11/09/20 0800  vancomycin (VANCOCIN) IVPB 1000 mg/200 mL premix  Status:  Discontinued        1,000 mg 200 mL/hr over 60 Minutes Intravenous Every 8 hours 11/09/20 0051 11/11/20 1656   11/09/20 0800  piperacillin-tazobactam (ZOSYN) IVPB 3.375 g        3.375 g 12.5 mL/hr over 240 Minutes Intravenous Every 8 hours 11/09/20 0051     11/09/20 0145  vancomycin (VANCOREADY) IVPB 2000 mg/400 mL        2,000 mg 200 mL/hr over 120 Minutes Intravenous  Once 11/09/20 0051 11/09/20 0343   11/09/20 0145  piperacillin-tazobactam (ZOSYN) IVPB 3.375 g  3.375 g 100 mL/hr over 30 Minutes Intravenous  Once 11/09/20 0051 11/09/20 0221      PRN meds: acetaminophen (TYLENOL) oral liquid 160 mg/5 mL, docusate, Gerhardt's butt cream, ondansetron (ZOFRAN) IV, polyethylene glycol, sodium chloride flush   Objective: Vitals:   11/11/20 1428 11/11/20 1603  BP: 135/82 (!) 151/83  Pulse: 90 85  Resp:  20  Temp:  97.9 F (36.6 C)  SpO2: 97% 99%    Intake/Output Summary (Last 24 hours) at 11/11/2020 1659 Last data filed at 11/11/2020 0400 Gross per 24 hour  Intake --  Output 950 ml  Net -950 ml   Filed Weights   11/10/20 0600 11/10/20 1451 11/11/20 0358  Weight: 95.3 kg 97 kg 95.9 kg   Weight change: 1.7 kg Body mass index is 26.43 kg/m.   Physical Exam: General exam: Young Caucasian male.  Sedated with meds Skin: No rashes, lesions or ulcers. HEENT: Atraumatic, normocephalic, no obvious bleeding.  Has Dobbhoff in place Lungs: Clear to auscultate bilaterally CVS: Tachycardic, no murmur GI/Abd soft, nontender, nondistended, bowel sound present CNS: Sedated at the time of my evaluation Psychiatry: Mostly restless and agitated per RN Extremities: No pedal edema, no calf tenderness  Data  Review: I have personally reviewed the laboratory data and studies available.  Recent Labs  Lab 11/06/20 0346 11/08/20 0430 11/09/20 1549 11/10/20 0413 11/11/20 0831  WBC 10.5 13.6* 11.6* 10.3 9.6  NEUTROABS  --   --   --   --  5.3  HGB 11.5* 11.9* 12.6* 12.0* 12.6*  HCT 36.4* 37.5* 38.4* 38.7* 38.5*  MCV 92.2 93.5 91.9 94.4 91.2  PLT 119* 127* 176 169 224   Recent Labs  Lab 11/06/20 0346 11/06/20 0346 11/06/20 1946 11/06/20 1946 11/07/20 0315 11/08/20 0430 11/09/20 1549 11/10/20 0413 11/11/20 0831  NA 142   < > 145   < > 145 145 146* 143 142  K 2.9*   < > 3.5   < > 3.5 3.5 4.0 3.7 3.7  CL 106   < > 110   < > 109 112* 115* 113* 113*  CO2 21*   < > 25   < > 25 24 19* 18* 21*  GLUCOSE 96   < > 110*   < > 120* 109* 129* 128* 131*  BUN 35*   < > 32*   < > 27* 24* 18 19 14   CREATININE 1.26*   < > 1.02   < > 0.94 0.94 0.85 0.85 0.65  CALCIUM 7.8*   < > 8.2*   < > 8.1* 8.3* 8.4* 8.2* 8.2*  MG 2.2  --  2.5*  --   --   --  2.2 1.9 1.8  PHOS  --   --   --   --   --   --  2.5  --  2.9   < > = values in this interval not displayed.    F/u labs ordered.  Signed, , MD [facility] 11/11/2020

## 2020-11-12 ENCOUNTER — Inpatient Hospital Stay (HOSPITAL_COMMUNITY): Payer: Self-pay

## 2020-11-12 LAB — GLUCOSE, CAPILLARY
Glucose-Capillary: 101 mg/dL — ABNORMAL HIGH (ref 70–99)
Glucose-Capillary: 106 mg/dL — ABNORMAL HIGH (ref 70–99)
Glucose-Capillary: 144 mg/dL — ABNORMAL HIGH (ref 70–99)
Glucose-Capillary: 148 mg/dL — ABNORMAL HIGH (ref 70–99)
Glucose-Capillary: 81 mg/dL (ref 70–99)
Glucose-Capillary: 98 mg/dL (ref 70–99)

## 2020-11-12 LAB — MAGNESIUM: Magnesium: 2.1 mg/dL (ref 1.7–2.4)

## 2020-11-12 LAB — BASIC METABOLIC PANEL
Anion gap: 9 (ref 5–15)
BUN: 14 mg/dL (ref 6–20)
CO2: 21 mmol/L — ABNORMAL LOW (ref 22–32)
Calcium: 8.6 mg/dL — ABNORMAL LOW (ref 8.9–10.3)
Chloride: 111 mmol/L (ref 98–111)
Creatinine, Ser: 0.6 mg/dL — ABNORMAL LOW (ref 0.61–1.24)
GFR, Estimated: >60 mL/min (ref >60)
Glucose, Bld: 103 mg/dL — ABNORMAL HIGH (ref 70–99)
Potassium: 4 mmol/L (ref 3.5–5.1)
Sodium: 141 mmol/L (ref 135–145)

## 2020-11-12 MED ORDER — LOPERAMIDE HCL 2 MG PO CAPS
2.0000 mg | ORAL_CAPSULE | Freq: Four times a day (QID) | ORAL | Status: DC
  Administered 2020-11-12 – 2020-11-14 (×7): 2 mg via ORAL
  Filled 2020-11-12 (×8): qty 1

## 2020-11-12 MED ORDER — OSMOLITE 1.5 CAL PO LIQD
1120.0000 mL | ORAL | Status: DC
  Administered 2020-11-13: 1000 mL
  Administered 2020-11-14 – 2020-11-15 (×2): 1120 mL
  Filled 2020-11-12 (×4): qty 2000

## 2020-11-12 MED ORDER — DEXTROSE-NACL 5-0.9 % IV SOLN: INTRAVENOUS | Status: AC

## 2020-11-12 MED ORDER — PROSOURCE TF PO LIQD
45.0000 mL | Freq: Four times a day (QID) | ORAL | Status: DC
  Administered 2020-11-12 – 2020-11-16 (×12): 45 mL
  Filled 2020-11-12 (×17): qty 45

## 2020-11-12 MED ORDER — ADULT MULTIVITAMIN W/MINERALS CH
1.0000 | ORAL_TABLET | Freq: Every day | ORAL | Status: DC
  Administered 2020-11-13 – 2020-11-14 (×2): 1 via ORAL
  Filled 2020-11-12 (×2): qty 1

## 2020-11-12 MED ORDER — RESOURCE THICKENUP CLEAR PO POWD
ORAL | Status: DC | PRN
  Filled 2020-11-12: qty 125

## 2020-11-12 NOTE — Progress Notes (Signed)
PROGRESS NOTE  Terrence Henry  DOB: February 17, 1981  PCP: Patient, No Pcp Per PYP:[id number]  DOA: 10/31/2020  LOS: 12 days   Chief complaint: Cardiac arrest  Brief narrative: Terrence Henry is a 39 y.o. male with PMH significant for polysubstance abuse On 10/31/2020, patient was brought to the ED as a cardiac arrest.  He was found unconscious by family in the bathroom with needles around.  Unknown downtime.  Family given Narcan without success before EMS arrived.  EMS achieved ROSC after 20 minutes of CPR, epinephrine x5 and defibrillation. Brought in to the ED with a King airway, in normal sinus rhythm and blood pressure low in 60s.  In the ED, he was intubated for a low GCS of 3, started on IV fluids and Levophed. Per cardiology, primary cause of cardiac arrest was not cardiac in origin and rather likely to be an overdose, hence patient did not have an emergent cardiac cath.  Echocardiogram showed EF of 40 to 45% with global hypokinesis and grade 1 diastolic dysfunction. Admitted to ICU, on mechanical ventilation, therapeutic hypothermia protocol. Extubated on 11/27. Transferred to hospitalist service on 12/2.  Subjective: Patient was seen and examined this morning.  He was awake, alert and oriented x3 today although little slow to respond.  He has soft voice.  He says he is hurting in the back of the throat probably because of Dobbhoff tube in place. Did not have any restlessness or agitation last night.  But he pulled out his Flexi-Seal tube.  Currently on bilateral wrist restraints.    Assessment/Plan: Cardiac arrest   Cardiogenic shock postarrest Acute hypoxic hypercapnic respiratory failure -Cardiac arrest likely due to heroin overdose.  Successfully resuscitated by EMS prior to arrival.   -Initially admitted to ICU, on mechanical ventilation, therapeutic hypothermia protocol. -Required IV fluid and pressors.  Hemodynamics improved. -Currently normal sinus rhythm, stable  blood pressure and breathing in room air.  Hypoxic ischemic encephalopathy Intermittent delirium  -Secondary to unknown downtime during cardiac arrest -Patient seems to have mild cognitive impairment and goes through intermittent delirium.  Per staff, last night patient was fidgety, restless and required to be in restraints. -While in the ICU, patient required Precedex drip.  It was weaned off.  He is currently on Seroquel and Klonopin to prevent benzodiazepine withdrawal.  Continue the same. -Continue Haldol as needed.  Follow QTC periodically. -Frequent reorientation -Continue one-to-one sitter  Diarrhea Non-anion gap metabolic acidosis -Continue to have diarrhea -Serum bicarb level continues to remain low. -GI pathogen panel negative. -Diarrhea most likely related to feeding.  I would add scheduled Imodium every 6 hours for next 2 days..  Acute kidney injury -Creatinine peaked at 2.53 secondary to shock.  Creatinine improved.  Continue to monitor Recent Labs    11/04/20 0446 11/05/20 0355 11/06/20 0346 11/06/20 1946 11/07/20 0315 11/08/20 0430 11/09/20 1549 11/10/20 0413 11/11/20 0831 11/12/20 0206  BUN 37* 40* 35* 32* 27* 24* 18 19 14 14   CREATININE 1.11 1.18 1.26* 1.02 0.94 0.94 0.85 0.85 0.65 0.60*   Hypokalemia/hypomagnesemia -Potassium and magnesium level improved with replacement. -Recheck Recent Labs  Lab 11/06/20 1946 11/07/20 0315 11/08/20 0430 11/09/20 1549 11/10/20 0413 11/11/20 0831 11/12/20 0206  K 3.5   < > 3.5 4.0 3.7 3.7 4.0  MG 2.5*  --   --  2.2 1.9 1.8 2.1  PHOS  --   --   --  2.5  --  2.9  --    < > = values in  this interval not displayed.   Physical deconditioning -Out of bed. PT/OT eval. -He stood up with physical therapy today.  Polysubstance abuse -Counseled to quit  SIRS -On 11/30, had a fever of 102.9. -Unclear source of infection -Blood culture sent.  Patient was started on broader spectrum antibiotic with vancomycin and  Zosyn. -Afebrile for last 48 hours at least.  Stopped vancomycin yesterday.  Okay to stop Zosyn tomorrow. -Continue to monitor.  Hypoalbuminemia -Albumin level 1.8.  Nutrition consulted.  Currently on tube feeding.  Mobility: PT follow-up needed Code Status:   Code Status: Full Code  Nutritional status: Body mass index is 26.21 kg/m. Nutrition Problem: Increased nutrient needs Etiology: acute illness Signs/Symptoms: estimated needs Diet Order            Diet NPO time specified  Diet effective now                DVT prophylaxis: enoxaparin (LOVENOX) injection 40 mg Start: 10/31/20 1630   Antimicrobials:  IV Zosyn Fluid: None Consultants: Critical care signed off Family Communication:  None at bedside  Status is: Inpatient  Remains inpatient appropriate because: Remains altered  Dispo: The patient is from: Home              Anticipated d/c is to: Likely will need nursing home              Anticipated d/c date is: > 3 days              Patient currently is not medically stable to d/c.       Infusions:  . sodium chloride Stopped (11/06/20 0639)  . feeding supplement (OSMOLITE 1.5 CAL) 1,000 mL (11/12/20 1355)  . piperacillin-tazobactam (ZOSYN)  IV 3.375 g (11/12/20 0940)    Scheduled Meds: . chlorhexidine  15 mL Mouth Rinse BID  . Chlorhexidine Gluconate Cloth  6 each Topical Daily  . clonazePAM  1 mg Per Tube BID  . enoxaparin (LOVENOX) injection  40 mg Subcutaneous Q24H  . feeding supplement (PROSource TF)  90 mL Per Tube QID  . fiber  1 packet Per Tube BID  . folic acid  1 mg Per Tube Daily  . LORazepam  0-4 mg Intravenous Q8H  . mouth rinse  15 mL Mouth Rinse q12n4p  . multivitamin  15 mL Per Tube Daily  . potassium chloride  20 mEq Per NG tube Daily  . QUEtiapine  100 mg Per Tube BID  . sodium bicarbonate  650 mg Per Tube TID  . thiamine  100 mg Per Tube Daily   Or  . thiamine  100 mg Intravenous Daily    Antimicrobials: Anti-infectives (From  admission, onward)   Start     Dose/Rate Route Frequency Ordered Stop   11/09/20 0800  vancomycin (VANCOCIN) IVPB 1000 mg/200 mL premix  Status:  Discontinued        1,000 mg 200 mL/hr over 60 Minutes Intravenous Every 8 hours 11/09/20 0051 11/11/20 1656   11/09/20 0800  piperacillin-tazobactam (ZOSYN) IVPB 3.375 g        3.375 g 12.5 mL/hr over 240 Minutes Intravenous Every 8 hours 11/09/20 0051 11/13/20 2359   11/09/20 0145  vancomycin (VANCOREADY) IVPB 2000 mg/400 mL        2,000 mg 200 mL/hr over 120 Minutes Intravenous  Once 11/09/20 0051 11/09/20 0343   11/09/20 0145  piperacillin-tazobactam (ZOSYN) IVPB 3.375 g        3.375 g 100 mL/hr over 30 Minutes Intravenous  Once 11/09/20 0051 11/09/20 0221      PRN meds: acetaminophen (TYLENOL) oral liquid 160 mg/5 mL, docusate, Gerhardt's butt cream, loperamide, ondansetron (ZOFRAN) IV, polyethylene glycol, sodium chloride flush   Objective: Vitals:   11/12/20 1200 11/12/20 1300  BP: (!) 140/96 (!) 142/93  Pulse: 83 (!) 57  Resp: 18 18  Temp: 97.9 F (36.6 C)   SpO2: 94% 94%    Intake/Output Summary (Last 24 hours) at 11/12/2020 1423 Last data filed at 11/12/2020 1300 Gross per 24 hour  Intake 928.04 ml  Output 575 ml  Net 353.04 ml   Filed Weights   11/10/20 1451 11/11/20 0358 11/12/20 0344  Weight: 97 kg 95.9 kg 95.1 kg   Weight change: -1.9 kg Body mass index is 26.21 kg/m.   Physical Exam: General exam: Young Caucasian male.  Not in physical distress Skin: No rashes, lesions or ulcers. HEENT: Atraumatic, normocephalic, no obvious bleeding.  Has Dobbhoff in place Lungs: Clear to auscultate bilaterally CVS: Tachycardic, no murmur GI/Abd soft, nontender, nondistended, bowel sound present CNS: Alert, awake, slow to respond but oriented to time place and person Psychiatry: Mood appropriate Extremities: No pedal edema, no calf tenderness  Data Review: I have personally reviewed the laboratory data and studies  available.  Recent Labs  Lab 11/06/20 0346 11/08/20 0430 11/09/20 1549 11/10/20 0413 11/11/20 0831  WBC 10.5 13.6* 11.6* 10.3 9.6  NEUTROABS  --   --   --   --  5.3  HGB 11.5* 11.9* 12.6* 12.0* 12.6*  HCT 36.4* 37.5* 38.4* 38.7* 38.5*  MCV 92.2 93.5 91.9 94.4 91.2  PLT 119* 127* 176 169 224   Recent Labs  Lab 11/06/20 1946 11/07/20 0315 11/08/20 0430 11/09/20 1549 11/10/20 0413 11/11/20 0831 11/12/20 0206  NA 145   < > 145 146* 143 142 141  K 3.5   < > 3.5 4.0 3.7 3.7 4.0  CL 110   < > 112* 115* 113* 113* 111  CO2 25   < > 24 19* 18* 21* 21*  GLUCOSE 110*   < > 109* 129* 128* 131* 103*  BUN 32*   < > 24* 18 19 14 14   CREATININE 1.02   < > 0.94 0.85 0.85 0.65 0.60*  CALCIUM 8.2*   < > 8.3* 8.4* 8.2* 8.2* 8.6*  MG 2.5*  --   --  2.2 1.9 1.8 2.1  PHOS  --   --   --  2.5  --  2.9  --    < > = values in this interval not displayed.    F/u labs ordered.  Signed, , MD [facility] 11/12/2020

## 2020-11-12 NOTE — Progress Notes (Signed)
Physical Therapy Treatment Patient Details Name: Terrence Henry MRN: [id number] DOB: 02-15-1981 Today's Date: 11/12/2020    History of Present Illness 39 year old male with polysubstance abuse who presented in the emergency department cardiac arrest.    PT Comments    The pt is continuing to make great progress with mobility and PT at this time. He was able to progress to completion of short ambulation in his room, but due to poor functional strength, coordination, and stability, requires modA of 2 to maintain safety with mobility. The pt is highly motivated, but demos poor safety awareness as well as continued deficits in safety awareness and memory which further complicate his safety and independence with mobility. The pt will continue to benefit from skilled PT to progress functional mobility and safety wit mobility prior to and following d/c to facilitate return to prior level of function and mobility.     Follow Up Recommendations  SNF;Supervision/Assistance - 24 hour     Equipment Recommendations  Rolling walker with 5" wheels;Wheelchair (measurements PT);Wheelchair cushion (measurements PT);3in1 (PT)    Recommendations for Other Services       Precautions / Restrictions Precautions Precautions: Fall Precaution Comments: mitts, restraints, NG tube Restrictions Weight Bearing Restrictions: No Other Position/Activity Restrictions: foley    Mobility  Bed Mobility Overal bed mobility: Needs Assistance Bed Mobility: Supine to Sit;Sit to Supine     Supine to sit: Min assist;+2 for safety/equipment Sit to supine: Min assist;+2 for safety/equipment   General bed mobility comments: pt able to initiate transfer to sitting EOB, minA to complete and steady in sitting position. minA/minG for static sitting. cues for sequencing with all bed mobility  Transfers Overall transfer level: Needs assistance Equipment used: 2 person hand held assist Transfers: Sit to/from Stand Sit to  Stand: Mod assist;+2 physical assistance         General transfer comment: modA of 2 for stability, pt able to rise with increased time and assist, no knee buckling, but slow to rise.   Ambulation/Gait Ambulation/Gait assistance: Mod assist;+2 physical assistance;+2 safety/equipment Gait Distance (Feet): 25 Feet Assistive device: 2 person hand held assist Gait Pattern/deviations: Step-through pattern;Decreased stride length;Ataxic;Staggering left;Staggering right;Narrow base of support Gait velocity: decreased Gait velocity interpretation: <1.31 ft/sec, indicative of household ambulator General Gait Details: narrow BOS with increased time/effort for each step. poor awareness, stability, multiple episodes of scissoring.        Balance Overall balance assessment: Needs assistance Sitting-balance support: Bilateral upper extremity supported;Feet supported Sitting balance-Leahy Scale: Poor Sitting balance - Comments: increased lateral lean L, pt able to activate musculature to correct LOB but unable to maintain or full correct without assist Postural control: Posterior lean;Left lateral lean Standing balance support: Bilateral upper extremity supported Standing balance-Leahy Scale: Poor Standing balance comment: BUE support                            Cognition Arousal/Alertness: Awake/alert Behavior During Therapy: Restless;WFL for tasks assessed/performed Overall Cognitive Status: Impaired/Different from baseline Area of Impairment: Orientation;Attention;Memory;Following commands;Safety/judgement;Awareness;Problem solving                 Orientation Level: Disoriented to;Situation Current Attention Level: Focused Memory: Decreased recall of precautions;Decreased short-term memory Following Commands: Follows one step commands consistently;Follows multi-step commands inconsistently Safety/Judgement: Decreased awareness of safety;Decreased awareness of  deficits Awareness: Intellectual Problem Solving: Slow processing;Decreased initiation;Difficulty sequencing;Requires verbal cues General Comments: Pt agreeable to all instructions, decreased awareness of safety and deficits. decreased  STM through session as pt asking multiple times why he cannot have food/drink      Exercises      General Comments General comments (skin integrity, edema, etc.): VSS on RA, pt reports dizziness with change in position, but BP stable      Pertinent Vitals/Pain Pain Assessment: No/denies pain Pain Intervention(s): Limited activity within patient's tolerance;Monitored during session           PT Goals (current goals can now be found in the care plan section) Acute Rehab PT Goals Patient Stated Goal: to get water/something to eat PT Goal Formulation: With family Time For Goal Achievement: 11/25/20 Potential to Achieve Goals: Fair Progress towards PT goals: Progressing toward goals    Frequency    Min 3X/week      PT Plan Current plan remains appropriate       AM-PAC PT "6 Clicks" Mobility   Outcome Measure  Help needed turning from your back to your side while in a flat bed without using bedrails?: A Little Help needed moving from lying on your back to sitting on the side of a flat bed without using bedrails?: A Little Help needed moving to and from a bed to a chair (including a wheelchair)?: A Lot Help needed standing up from a chair using your arms (e.g., wheelchair or bedside chair)?: A Little Help needed to walk in hospital room?: A Lot Help needed climbing 3-5 steps with a railing? : Total 6 Click Score: 14    End of Session Equipment Utilized During Treatment: Gait belt Activity Tolerance: Patient tolerated treatment well Patient left: in bed;with call bell/phone within reach;with bed alarm set;with restraints reapplied Nurse Communication: Mobility status PT Visit Diagnosis: Unsteadiness on feet (R26.81);Other abnormalities  of gait and mobility (R26.89);Muscle weakness (generalized) (M62.81)     Time: 9935-7017 PT Time Calculation (min) (ACUTE ONLY): 31 min  Charges:  $Gait Training: 23-37 mins                     Rolm Baptise, PT, DPT   Acute Rehabilitation Department Pager #: [phone]   Gaetana Michaelis 11/12/2020, 10:30 AM

## 2020-11-12 NOTE — Progress Notes (Signed)
Nutrition Follow-up  DOCUMENTATION CODES:   Not applicable  INTERVENTION:  Transition pt to nocturnal TF via Cortrak: -Osmolite 1.5 @ 92m/hr for 14 hours from 1700-0700 -421mProsource TF QID  Nocturnal TF regimen will provide 1840 kcals, 114 grams protein, 85319mree water Meets 73% kcal needs and 91% protein needs  -Vital Cuisine shakes po TID, each supplement provides 520 kcals and 22 grams protein  -Magic cup TID with meals, each supplement provides 290 kcal and 9 grams of protein -d/c liquid mvi -mvi with minerals po daily  NUTRITION DIAGNOSIS:   Increased nutrient needs related to acute illness as evidenced by estimated needs.  ongoing  GOAL:   Patient will meet greater than or equal to 90% of their needs  Met with TF  MONITOR:   Vent status, Skin, TF tolerance, Weight trends, Labs, I & O's  REASON FOR ASSESSMENT:   Ventilator    ASSESSMENT:   Patient with PMH significant for polysubstance use and inguinal hernia. Presents this admission with asystolic cardiac arrest 2/2 to suspect OD.   11/27 - extubated  12/1 - Cortrak placed (gastric) 12/2 - tx to hospitalist service  MD informed RD that pt's Cortrak was not in the appropriate place. Per RN, pt's Cortrak was charted as being at 72cm during day shift on 12/2 and was found to be at 41cm when checked by nightshift nurse. Cortrak team replaced tube today. Per xray, tip is near the pylorus.   Current TF orders: Osmolite 1.5 cal @ 32m36m with 90ml66msource TF QID.   Pt had MBS today. SLP recommending Dysphagia 2 diet with nectar thick liquids. Will transition pt to nocturnal feeding regimen to stimulate appetite during the day while still meeting the majority of his needs via TF. Will order oral nutrition supplements as well.   Note pt is having diarrhea and is receiving a liquid MVI which is likely contributing to diarrhea. Will transition pt to mvi with minerals to be given PO.   Admission weight: 106.7  kg Current weight: 95.1 kg  UOP: 925ml 75mhours  Labs: CBGs [phone]: nutrisource fiber BID, folvite, imodium, liquid mvi, klor-con 20mEq 59my, thiamine  Diet Order:   Diet Order            DIET DYS 2 Room service appropriate? Yes with Assist; Fluid consistency: Nectar Thick  Diet effective now                 EDUCATION NEEDS:   Not appropriate for education at this time  Skin:  Skin Assessment: Reviewed RN Assessment  Last BM:  12/3 type 6  Height:   Ht Readings from Last 1 Encounters:  10/31/20 '6\' 3"'  (1.905 m)    Weight:   Wt Readings from Last 1 Encounters:  11/12/20 95.1 kg    BMI:  Body mass index is 26.21 kg/m.  Estimated Nutritional Needs:   Kcal:  2500-2700 kcal  Protein:  125-150 grams  Fluid:  >/= 2 L/day   Keric Zehren Larkin InaD, LDN RD pager number and weekend/on-call pager number located in [email or web address]

## 2020-11-12 NOTE — Procedures (Signed)
Cortrak  Person Inserting Tube:  Osa Craver, RD Tube Type:  Cortrak - 43 inches Tube Location:  Right nare Initial Placement:  Stomach Secured by: Bridle Technique Used to Measure Tube Placement:  Documented cm marking at nare/ corner of mouth Cortrak Secured At:  73 cm   Cortrak Tube Team Note:  Consult received to place a Cortrak feeding tube.   Difficult placement.  X-ray is required, abdominal x-ray has been ordered by the Cortrak team. Please confirm tube placement before using the Cortrak tube.   If the tube becomes dislodged please keep the tube and contact the Cortrak team at [email or web address] ([email or web address]) for replacement.  If after hours and replacement cannot be delayed, place a NG tube and confirm placement with an abdominal x-ray.   Romelle Starcher MS, RDN, LDN, CNSC Registered Dietitian III Clinical Nutrition RD Pager and On-Call Pager Number Located in [place]

## 2020-11-12 NOTE — Progress Notes (Signed)
Patient sleeping and has sleep apnea. H.R. will drop down to 47 for a few beats then backup to 70's S.R.

## 2020-11-12 NOTE — Progress Notes (Signed)
Assessing patient and Tube Feeding makings on Coretrak  is at 41 was 72 on day shift. Red[place] R.N. aware Call Dr. Clarisa Fling call return. See orders to strop tube feeding and get PCXR. No resp. Distress noted. Lungs clear. Resp. Even and unlab.

## 2020-11-12 NOTE — Progress Notes (Addendum)
CBG 81.Patient NPO . Tube feeding not going . Have order to call M.D. for CBG less than 90. Dr.Z. Benna Dunks paged and made aware. See orders to start D 5 NS at 50 ml/hr

## 2020-11-12 NOTE — Progress Notes (Signed)
Pharmacy Antibiotic Note  Terrence Henry is a 39 y.o. male admitted on 10/31/2020, now with concern for pneumonia and sepsis.  Pharmacy has been consulted for Zosyn dosing.  Currently on day #4 of antibiotics. WBC WNL, afebrile. Scr stable at 0.6 (CrCl>100 mL/min).   Plan: Zosyn 3.375g IV every 8 hours (4-hour infusion) Monitor renal fx, cx results, clinical pic - will stop after 5 days (end date entered for 12/4)  Height: 6\' 3"  (190.5 cm) Weight: 95.1 kg (209 lb 10.5 oz) IBW/kg (Calculated) : 84.5  Temp (24hrs), Avg:98.2 F (36.8 C), Min:97.9 F (36.6 C), Max:98.6 F (37 C)  Recent Labs  Lab 11/06/20 0346 11/06/20 1946 11/08/20 0430 11/09/20 1549 11/10/20 0413 11/11/20 0831 11/12/20 0206  WBC 10.5  --  13.6* 11.6* 10.3 9.6  --   CREATININE 1.26*   < > 0.94 0.85 0.85 0.65 0.60*   < > = values in this interval not displayed.    Estimated Creatinine Clearance: 149.6 mL/min (A) (by C-G formula based on SCr of 0.6 mg/dL (L)).    No Known Allergies  Antimicrobials this admission:  Zosyn 11/30>> Vanc 11/30>> 12/2  Dose adjustments this admission:  N/A  Microbiology results:  Bcx 11/21: 1 of 4 - staph capitis BCx x2 11/29 - ngtd MRSA PCR 11/21: neg   Thank you for allowing pharmacy to be a part of this patient's care.  12/21, PharmD, BCCCP Clinical Pharmacist  Phone: [phone] 11/12/2020 10:31 AM  Please check AMION for all [facility] Pharmacy phone numbers After 10:00 PM, call Main Pharmacy [phone]

## 2020-11-12 NOTE — Evaluation (Signed)
Clinical/Bedside Swallow Evaluation Patient Details  Name: Terrence Henry MRN: [id number] Date of Birth: Mar 05, 1981  Today's Date: 11/12/2020 Time: SLP Start Time (ACUTE ONLY): 1005 SLP Stop Time (ACUTE ONLY): 1027 SLP Time Calculation (min) (ACUTE ONLY): 22 min  Past Medical History:  Past Medical History:  Diagnosis Date  . Acute hepatitis A 03/2020  . Anxiety   . Carpal tunnel syndrome    bilateral  . Drug addiction (HCC)   . Elevated LFTs   . Hepatitis C virus infection   . Inguinal hernia    left  . Overdose   . Panic attacks   . Tobacco abuse   . Venereal wart    Past Surgical History:  Past Surgical History:  Procedure Laterality Date  . MANDIBLE FRACTURE SURGERY     Altercation   HPI:  Pt is a 39 year old male with history of polysubstance abuse who presented after he was found down in the bathroom with needles around, he was given Narcan by family before EMS arrived. ROSC achieved after 20 minutes of CPR in the ED. CT head was negative. ETT 11/21-11/27 for a low GCS of 3. Abdominal x-ray 12/1 questioned a degree of underlying ileus. Cortrak placed 12/1   Assessment / Plan / Recommendation Clinical Impression  Pt was seen for bedside swallow evaluation and he denied a history of dysphagia. Oral mechanism exam was [facility]. Pt's voice was hoarse and breathy with reduced vocal intensity, suggesting vocal fold insufficiency due to possible vocal fold trauma from prolonged intubation. Dentition was adequate. He exhibited throat clearing and/or coughing with all consistencies suggesting aspiration. Multiple swallows were observed with solids, indicating possible pharyngeal residue, and pt frequently requested liquids thereafter to help "wash it down". Pt was less symptomatic with smaller bolus sizes. It is recommended that his NPO status be maintained and that a modified barium swallow study be conducted to further assess swallow function.  SLP Visit Diagnosis: Dysphagia,  unspecified (R13.10)    Aspiration Risk  Mild aspiration risk    Diet Recommendation NPO;Alternative means - temporary   Medication Administration: Via alternative means    Other  Recommendations Oral Care Recommendations: Oral care QID   Follow up Recommendations  (TBD)      Frequency and Duration            Prognosis Prognosis for Safe Diet Advancement: Good Barriers to Reach Goals: Severity of deficits      Swallow Study   General Date of Onset: 11/11/20 HPI: Pt is a 39 year old male with history of polysubstance abuse who presented after he was found down in the bathroom with needles around, he was given Narcan by family before EMS arrived. ROSC achieved after 20 minutes of CPR in the ED. CT head was negative. ETT 11/21-11/27 for a low GCS of 3. Abdominal x-ray 12/1 questioned a degree of underlying ileus. Cortrak placed 12/1 Type of Study: Bedside Swallow Evaluation Previous Swallow Assessment: None Diet Prior to this Study: NPO;NG Tube Temperature Spikes Noted: No Respiratory Status: Room air History of Recent Intubation: Yes Length of Intubations (days): 6 days Date extubated: 11/06/20 Behavior/Cognition: Cooperative;Lethargic/Drowsy Oral Cavity Assessment: Within Functional Limits Oral Care Completed by SLP: No Oral Cavity - Dentition: Adequate natural dentition Vision: Functional for self-feeding Self-Feeding Abilities: Needs assist Patient Positioning: Upright in bed;Postural control adequate for testing Baseline Vocal Quality: Breathy;Hoarse;Low vocal intensity Volitional Cough: Strong Volitional Swallow: Able to elicit    Oral/Motor/Sensory Function Overall Oral Motor/Sensory Function: Within functional limits   Ice Chips  Ice chips: Impaired Presentation: Spoon   Thin Liquid Thin Liquid: Impaired Presentation: Cup;Straw Pharyngeal  Phase Impairments: Cough - Immediate;Throat Clearing - Immediate    Nectar Thick Nectar Thick Liquid: Not tested   Honey  Thick Honey Thick Liquid: Not tested   Puree Puree: Impaired Presentation: Spoon   Solid     Solid: Impaired Pharyngeal Phase Impairments: Multiple swallows;Throat Clearing - Delayed     Chizuko Trine I. Vear Clock, MS, CCC-SLP Acute Rehabilitation Services Office number [phone] Pager [phone]  Scheryl Marten 11/12/2020,10:42 AM

## 2020-11-12 NOTE — Progress Notes (Signed)
Dr. Clarisa Fling called and made aware of CXR results. Will have Coretrak team advance tube in a.m.

## 2020-11-12 NOTE — Progress Notes (Signed)
Modified Barium Swallow Progress Note  Patient Details  Name: Terrence Henry MRN: [id number] Date of Birth: 1981-05-24  Today's Date: 11/12/2020  Modified Barium Swallow completed.  Full report located under Chart Review in the Imaging Section.  Brief recommendations include the following:  Clinical Impression  Pt presented with pharyngeal dysphagia characterized by reduced lingual retraction, reduced anterior laryngeal movement, and a pharyngeal delay. He demonstrated mild vallecular and pyriform sinus residue which increased to moderate with regular texture solids. The barium tablet became lodged in the valleculae but it was ultimately transported with additional boluses of nectar thick liquids. Penetration (PAS 3) of thin liquids was noted before deglutition and aspiration (PAS 7) during the swallow. Aspiration resulted in coughing and this was effective in expelling some of the aspirated material. Penetration and aspiration were eliminated with use of a chin tuck posture, but pt was unable to consistently demonstrate this without tactile cues. No penetration/aspiration was observed with nectar thick liquids. A dysphagia 2 diet with nectar thick liquids is recommended at this time. SLP will follow to assess improvement in swallow function and readiness for diet advancement.    Swallow Evaluation Recommendations       SLP Diet Recommendations: Dysphagia 2 (Fine chop) solids;Nectar thick liquid   Liquid Administration via: Cup;Straw   Medication Administration: Via alternative means (or whole with puree)   Supervision: Patient able to self feed;Full supervision/cueing for compensatory strategies   Compensations: Slow rate;Small sips/bites   Postural Changes: Remain semi-upright after after feeds/meals (Comment);Seated upright at 90 degrees   Oral Care Recommendations: Oral care BID      Marabelle Cushman I. Vear Clock, MS, CCC-SLP Acute Rehabilitation Services Office number  [phone] Pager [phone]   Scheryl Marten 11/12/2020,2:49 PM

## 2020-11-13 LAB — GASTROINTESTINAL PANEL BY PCR, STOOL (REPLACES STOOL CULTURE)

## 2020-11-13 LAB — BASIC METABOLIC PANEL
Anion gap: 12 (ref 5–15)
BUN: 16 mg/dL (ref 6–20)
CO2: 20 mmol/L — ABNORMAL LOW (ref 22–32)
Calcium: 8.6 mg/dL — ABNORMAL LOW (ref 8.9–10.3)
Chloride: 109 mmol/L (ref 98–111)
Creatinine, Ser: 0.6 mg/dL — ABNORMAL LOW (ref 0.61–1.24)
GFR, Estimated: >60 mL/min (ref >60)
Glucose, Bld: 113 mg/dL — ABNORMAL HIGH (ref 70–99)
Potassium: 4 mmol/L (ref 3.5–5.1)
Sodium: 141 mmol/L (ref 135–145)

## 2020-11-13 LAB — GLUCOSE, CAPILLARY
Glucose-Capillary: 117 mg/dL — ABNORMAL HIGH (ref 70–99)
Glucose-Capillary: 122 mg/dL — ABNORMAL HIGH (ref 70–99)
Glucose-Capillary: 128 mg/dL — ABNORMAL HIGH (ref 70–99)
Glucose-Capillary: 129 mg/dL — ABNORMAL HIGH (ref 70–99)
Glucose-Capillary: 145 mg/dL — ABNORMAL HIGH (ref 70–99)
Glucose-Capillary: 187 mg/dL — ABNORMAL HIGH (ref 70–99)

## 2020-11-13 LAB — CULTURE, BLOOD (ROUTINE X 2)
Culture: NO GROWTH
Culture: NO GROWTH
Special Requests: ADEQUATE
Special Requests: ADEQUATE

## 2020-11-13 MED ORDER — LORAZEPAM 2 MG/ML IJ SOLN
2.0000 mg | Freq: Once | INTRAMUSCULAR | Status: AC
  Administered 2020-11-13: 2 mg via INTRAVENOUS
  Filled 2020-11-13: qty 1

## 2020-11-13 NOTE — Progress Notes (Signed)
PROGRESS NOTE  Terrence Henry  DOB: September 13, 1981  PCP: Patient, No Pcp Per GYK:[id number]  DOA: 10/31/2020  LOS: 13 days   Chief complaint: Cardiac arrest  Brief narrative: Terrence Henry is a 39 y.o. male with PMH significant for polysubstance abuse On 10/31/2020, patient was brought to the ED as a cardiac arrest.  He was found unconscious by family in the bathroom with needles around.  Unknown downtime.  Family given Narcan without success before EMS arrived.  EMS achieved ROSC after 20 minutes of CPR, epinephrine x5 and defibrillation. Brought in to the ED with a King airway, in normal sinus rhythm and blood pressure low in 60s.  In the ED, he was intubated for a low GCS of 3, started on IV fluids and Levophed. Per cardiology, primary cause of cardiac arrest was not cardiac in origin and rather likely to be an overdose, hence patient did not have an emergent cardiac cath.  Echocardiogram showed EF of 40 to 45% with global hypokinesis and grade 1 diastolic dysfunction. Admitted to ICU, on mechanical ventilation, therapeutic hypothermia protocol. Extubated on 11/27. Transferred to hospitalist service on 12/2.  Subjective: Patient was seen and examined this morning.  Sitting up in bed.  Not in distress.  No new symptoms.  Per RN, he was calm last night.  Mental status gradually improving. Failed swallow study yesterday.  Continues to have tube feeding via core track tube.    Assessment/Plan: Cardiac arrest   Cardiogenic shock postarrest Acute hypoxic hypercapnic respiratory failure -Cardiac arrest likely due to heroin overdose.  Successfully resuscitated by EMS prior to arrival.   -Initially admitted to ICU, on mechanical ventilation, therapeutic hypothermia protocol. -Required IV fluid and pressors. Hemodynamics improved. -Currently normal sinus rhythm, stable blood pressure and breathing in room air.  Hypoxic ischemic encephalopathy Intermittent delirium  -Secondary to  unknown downtime during cardiac arrest -Patient seems to have mild cognitive impairment and goes through intermittent delirium. Per staff, last night patient was calm and not restless. -Currently on Seroquel, as needed Ativan. -Frequent reorientation -Continue one-to-one sitter  Diarrhea Non-anion gap metabolic acidosis -I have started patient on scheduled Imodium but patient continues to have diarrhea.  To have diarrhea -GI pathogen panel negative. -Diarrhea most likely related to feeding.  Continue to monitor.  Okay to reinsert Flexi-Seal tube.  Acute kidney injury -Creatinine peaked at 2.53 secondary to shock.  Creatinine improved.  Continue to monitor Recent Labs    11/05/20 0355 11/06/20 0346 11/06/20 1946 11/07/20 0315 11/08/20 0430 11/09/20 1549 11/10/20 0413 11/11/20 0831 11/12/20 0206 11/13/20 0142  BUN 40* 35* 32* 27* 24* 18 19 14 14 16   CREATININE 1.18 1.26* 1.02 0.94 0.94 0.85 0.85 0.65 0.60* 0.60*   Hypokalemia/hypomagnesemia -Potassium and magnesium level improved with replacement. -Continue to check vertically. Recent Labs  Lab 11/06/20 1946 11/07/20 0315 11/09/20 1549 11/10/20 0413 11/11/20 0831 11/12/20 0206 11/13/20 0142  K 3.5   < > 4.0 3.7 3.7 4.0 4.0  MG 2.5*  --  2.2 1.9 1.8 2.1  --   PHOS  --   --  2.5  --  2.9  --   --    < > = values in this interval not displayed.   Physical deconditioning -Out of bed. PT/OT eval. -He stood up with physical therapy today.  Polysubstance abuse -Counseled to quit  SIRS -On 11/30, had a fever of 102.9. -Unclear source of infection -Blood culture sent.  Patient was started on broader spectrum antibiotic with vancomycin  and Zosyn. -Afebrile for last 48 hours at least.  Stopped vancomycin yesterday.  Okay to stop Zosyn today. -Continue to monitor.  Hypoalbuminemia -Albumin level 1.8.  Nutrition consulted.  Currently on tube feeding.  Mobility: PT follow-up needed Code Status:   Code Status: Full  Code  Nutritional status: Body mass index is 25.38 kg/m. Nutrition Problem: Increased nutrient needs Etiology: acute illness Signs/Symptoms: estimated needs Diet Order            DIET DYS 2 Room service appropriate? Yes with Assist; Fluid consistency: Nectar Thick  Diet effective now                DVT prophylaxis: enoxaparin (LOVENOX) injection 40 mg Start: 10/31/20 1630   Antimicrobials:  IV Zosyn Fluid: None Consultants: Critical care signed off Family Communication:  Discussed with patient's mother at bedside on 12/3  Status is: Inpatient  Remains inpatient appropriate because: Remains altered  Dispo: The patient is from: Home              Anticipated d/c is to: Likely will need nursing home              Anticipated d/c date is: > 3 days              Patient currently is not medically stable to d/c.       Infusions:  . sodium chloride Stopped (11/06/20 0639)  . piperacillin-tazobactam (ZOSYN)  IV 3.375 g (11/13/20 5456)    Scheduled Meds: . chlorhexidine  15 mL Mouth Rinse BID  . Chlorhexidine Gluconate Cloth  6 each Topical Daily  . clonazePAM  1 mg Per Tube BID  . enoxaparin (LOVENOX) injection  40 mg Subcutaneous Q24H  . feeding supplement (OSMOLITE 1.5 CAL)  1,120 mL Per Tube Q24H  . feeding supplement (PROSource TF)  45 mL Per Tube QID  . fiber  1 packet Per Tube BID  . folic acid  1 mg Per Tube Daily  . loperamide  2 mg Oral Q6H  . LORazepam  0-4 mg Intravenous Q8H  . mouth rinse  15 mL Mouth Rinse q12n4p  . multivitamin with minerals  1 tablet Oral Daily  . potassium chloride  20 mEq Per NG tube Daily  . QUEtiapine  100 mg Per Tube BID  . sodium bicarbonate  650 mg Per Tube TID  . thiamine  100 mg Per Tube Daily   Or  . thiamine  100 mg Intravenous Daily    Antimicrobials: Anti-infectives (From admission, onward)   Start     Dose/Rate Route Frequency Ordered Stop   11/09/20 0800  vancomycin (VANCOCIN) IVPB 1000 mg/200 mL premix  Status:   Discontinued        1,000 mg 200 mL/hr over 60 Minutes Intravenous Every 8 hours 11/09/20 0051 11/11/20 1656   11/09/20 0800  piperacillin-tazobactam (ZOSYN) IVPB 3.375 g        3.375 g 12.5 mL/hr over 240 Minutes Intravenous Every 8 hours 11/09/20 0051 11/13/20 2359   11/09/20 0145  vancomycin (VANCOREADY) IVPB 2000 mg/400 mL        2,000 mg 200 mL/hr over 120 Minutes Intravenous  Once 11/09/20 0051 11/09/20 0343   11/09/20 0145  piperacillin-tazobactam (ZOSYN) IVPB 3.375 g        3.375 g 100 mL/hr over 30 Minutes Intravenous  Once 11/09/20 0051 11/09/20 0221      PRN meds: acetaminophen (TYLENOL) oral liquid 160 mg/5 mL, docusate, Gerhardt's butt cream, ondansetron (ZOFRAN) IV,  polyethylene glycol, Resource ThickenUp Clear, sodium chloride flush   Objective: Vitals:   11/13/20 1059 11/13/20 1100  BP: (!) 143/84   Pulse: 91 92  Resp: 16 (!) 21  Temp: 98.4 F (36.9 C)   SpO2: 93% 92%    Intake/Output Summary (Last 24 hours) at 11/13/2020 1334 Last data filed at 11/13/2020 1100 Gross per 24 hour  Intake 2655.49 ml  Output 711 ml  Net 1944.49 ml   Filed Weights   11/11/20 0358 11/12/20 0344 11/13/20 0420  Weight: 95.9 kg 95.1 kg 92.1 kg   Weight change: -3 kg Body mass index is 25.38 kg/m.   Physical Exam: General exam: Young Caucasian male.  Not in physical distress Skin: No rashes, lesions or ulcers. HEENT: Atraumatic, normocephalic, no obvious bleeding.  Has Dobbhoff in place Lungs: Clear to auscultation bilaterally CVS: Tachycardic, no murmur GI/Abd soft, nontender, nondistended, bowel sound present CNS: Alert, awake, slow to respond but oriented to time place and person.  Not restless or agitated. Psychiatry: Mood appropriate Extremities: No pedal edema, no calf tenderness  Data Review: I have personally reviewed the laboratory data and studies available.  Recent Labs  Lab 11/08/20 0430 11/09/20 1549 11/10/20 0413 11/11/20 0831  WBC 13.6* 11.6* 10.3 9.6   NEUTROABS  --   --   --  5.3  HGB 11.9* 12.6* 12.0* 12.6*  HCT 37.5* 38.4* 38.7* 38.5*  MCV 93.5 91.9 94.4 91.2  PLT 127* 176 169 224   Recent Labs  Lab 11/06/20 1946 11/07/20 0315 11/09/20 1549 11/10/20 0413 11/11/20 0831 11/12/20 0206 11/13/20 0142  NA 145   < > 146* 143 142 141 141  K 3.5   < > 4.0 3.7 3.7 4.0 4.0  CL 110   < > 115* 113* 113* 111 109  CO2 25   < > 19* 18* 21* 21* 20*  GLUCOSE 110*   < > 129* 128* 131* 103* 113*  BUN 32*   < > 18 19 14 14 16   CREATININE 1.02   < > 0.85 0.85 0.65 0.60* 0.60*  CALCIUM 8.2*   < > 8.4* 8.2* 8.2* 8.6* 8.6*  MG 2.5*  --  2.2 1.9 1.8 2.1  --   PHOS  --   --  2.5  --  2.9  --   --    < > = values in this interval not displayed.    F/u labs ordered.  Signed, , MD [facility] 11/13/2020

## 2020-11-14 LAB — BASIC METABOLIC PANEL
Anion gap: 12 (ref 5–15)
BUN: 12 mg/dL (ref 6–20)
CO2: 21 mmol/L — ABNORMAL LOW (ref 22–32)
Calcium: 9 mg/dL (ref 8.9–10.3)
Chloride: 106 mmol/L (ref 98–111)
Creatinine, Ser: 0.59 mg/dL — ABNORMAL LOW (ref 0.61–1.24)
GFR, Estimated: >60 mL/min (ref >60)
Glucose, Bld: 127 mg/dL — ABNORMAL HIGH (ref 70–99)
Potassium: 4.1 mmol/L (ref 3.5–5.1)
Sodium: 139 mmol/L (ref 135–145)

## 2020-11-14 LAB — GLUCOSE, CAPILLARY
Glucose-Capillary: 105 mg/dL — ABNORMAL HIGH (ref 70–99)
Glucose-Capillary: 125 mg/dL — ABNORMAL HIGH (ref 70–99)
Glucose-Capillary: 127 mg/dL — ABNORMAL HIGH (ref 70–99)
Glucose-Capillary: 131 mg/dL — ABNORMAL HIGH (ref 70–99)
Glucose-Capillary: 147 mg/dL — ABNORMAL HIGH (ref 70–99)
Glucose-Capillary: 160 mg/dL — ABNORMAL HIGH (ref 70–99)

## 2020-11-14 LAB — PHOSPHORUS: Phosphorus: 3 mg/dL (ref 2.5–4.6)

## 2020-11-14 MED ORDER — LOPERAMIDE HCL 2 MG PO CAPS
2.0000 mg | ORAL_CAPSULE | Freq: Four times a day (QID) | ORAL | Status: DC | PRN
  Administered 2020-11-15 (×2): 2 mg via ORAL
  Filled 2020-11-14 (×2): qty 1

## 2020-11-14 MED ORDER — CHOLESTYRAMINE 4 G PO PACK
4.0000 g | PACK | Freq: Two times a day (BID) | ORAL | Status: DC
  Administered 2020-11-14 – 2020-11-16 (×5): 4 g via ORAL
  Filled 2020-11-14 (×6): qty 1

## 2020-11-14 MED ORDER — QUETIAPINE FUMARATE 100 MG PO TABS
100.0000 mg | ORAL_TABLET | Freq: Every day | ORAL | Status: DC
  Administered 2020-11-14 – 2020-11-16 (×3): 100 mg
  Filled 2020-11-14 (×3): qty 1

## 2020-11-14 MED ORDER — CLONAZEPAM 0.5 MG PO TABS
1.0000 mg | ORAL_TABLET | Freq: Every day | ORAL | Status: DC
  Administered 2020-11-14 – 2020-11-16 (×3): 1 mg
  Filled 2020-11-14 (×3): qty 2

## 2020-11-14 MED ORDER — ADULT MULTIVITAMIN W/MINERALS CH
1.0000 | ORAL_TABLET | Freq: Every day | ORAL | Status: DC
  Administered 2020-11-15 – 2020-11-17 (×3): 1
  Filled 2020-11-14 (×3): qty 1

## 2020-11-14 NOTE — Progress Notes (Signed)
PROGRESS NOTE  [name]  DOB: 28-Apr-1981  PCP: Patient, No Pcp Per UDJ:[id number]  DOA: 10/31/2020  LOS: 14 days   Chief complaint: Cardiac arrest  Brief narrative: Terrence Henry is a 39 y.o. male with PMH significant for polysubstance abuse On 10/31/2020, patient was brought to the ED as a cardiac arrest.  He was found unconscious by family in the bathroom with needles around.  Unknown downtime.  Family given Narcan without success before EMS arrived.  EMS achieved ROSC after 20 minutes of CPR, epinephrine x5 and defibrillation. Brought in to the ED with a King airway, in normal sinus rhythm and blood pressure low in 60s.  In the ED, he was intubated for a low GCS of 3, started on IV fluids and Levophed. Per cardiology, primary cause of cardiac arrest was not cardiac in origin and rather likely to be an overdose, hence patient did not have an emergent cardiac cath.  Echocardiogram showed EF of 40 to 45% with global hypokinesis and grade 1 diastolic dysfunction. Admitted to ICU, on mechanical ventilation, therapeutic hypothermia protocol. Extubated on 11/27. Transferred to hospitalist service on 12/2.  Subjective: Patient was seen and examined this morning.  Somnolent.  Per RN, he had a good night.  He is eating dysphagia 2 diet.  Tube feeding ongoing.  Assessment/Plan: Cardiac arrest   Cardiogenic shock postarrest Acute hypoxic hypercapnic respiratory failure -Cardiac arrest likely due to heroin overdose.  Successfully resuscitated by EMS prior to arrival.   -Initially admitted to ICU, on mechanical ventilation, therapeutic hypothermia protocol. -Required IV fluid and pressors. Hemodynamics improved. -Currently normal sinus rhythm, stable blood pressure and breathing in room air.  Hypoxic ischemic encephalopathy Intermittent delirium  -Secondary to unknown downtime during cardiac arrest -Patient seems to have mild cognitive impairment and goes through intermittent  delirium. Per staff, last night patient was calm and not restless. -Currently on Seroquel, as needed Ativan. -Frequent reorientation.  I would minimize the use of sedating meds in the morning. -Continue one-to-one sitter  Diarrhea Non-anion gap metabolic acidosis -I have started patient on scheduled Imodium but patient continues to have diarrhea.  To have diarrhea -GI pathogen panel negative. -Diarrhea most likely related to feeding.  It is improving with Imodium.  This morning he switched to Imodium as needed and started on scheduled Questran.  Acute kidney injury -Creatinine peaked at 2.53 secondary to shock.  Creatinine improved.  Continue to monitor Recent Labs    11/06/20 0346 11/06/20 1946 11/07/20 0315 11/08/20 0430 11/09/20 1549 11/10/20 0413 11/11/20 0831 11/12/20 0206 11/13/20 0142 11/14/20 0454  BUN 35* 32* 27* 24* 18 19 14 14 16 12   CREATININE 1.26* 1.02 0.94 0.94 0.85 0.85 0.65 0.60* 0.60* 0.59*   Hypokalemia/hypomagnesemia -Potassium and magnesium level improved with replacement. -Continue to check vertically. Recent Labs  Lab 11/09/20 1549 11/09/20 1549 11/10/20 0413 11/11/20 0831 11/12/20 0206 11/13/20 0142 11/14/20 0454  K 4.0   < > 3.7 3.7 4.0 4.0 4.1  MG 2.2  --  1.9 1.8 2.1  --   --   PHOS 2.5  --   --  2.9  --   --  3.0   < > = values in this interval not displayed.   Physical deconditioning -Out of bed. PT/OT eval. -He stood up with physical therapy today.  Polysubstance abuse -Counseled to quit  SIRS -On 11/30, had a fever of 102.9. -Unclear source of infection -Blood culture sent.  Patient was started on broader spectrum antibiotic with  vancomycin and Zosyn. -Afebrile for last 4 days hours at least.  Completed course of Zosyn and vancomycin.-Continue to monitor.  Hypoalbuminemia -Albumin level 1.8.  Nutrition consulted.  Currently on tube feeding.  Mobility: PT follow-up needed Code Status:   Code Status: Full Code  Nutritional  status: Body mass index is 25.38 kg/m. Nutrition Problem: Increased nutrient needs Etiology: acute illness Signs/Symptoms: estimated needs Diet Order            DIET DYS 2 Room service appropriate? Yes with Assist; Fluid consistency: Nectar Thick  Diet effective now                DVT prophylaxis: enoxaparin (LOVENOX) injection 40 mg Start: 10/31/20 1630   Antimicrobials:  None Fluid: None Consultants: Critical care signed off Family Communication:  Discussed with patient's mother and patient's sister  Status is: Inpatient  Remains inpatient appropriate because: Remains altered  Dispo: The patient is from: Home              Anticipated d/c is to: Likely will need nursing home              Anticipated d/c date is: > 3 days              Patient currently is not medically stable to d/c.       Infusions:  . sodium chloride Stopped (11/06/20 5277)    Scheduled Meds: . chlorhexidine  15 mL Mouth Rinse BID  . Chlorhexidine Gluconate Cloth  6 each Topical Daily  . cholestyramine  4 g Oral BID  . clonazePAM  1 mg Per Tube QHS  . enoxaparin (LOVENOX) injection  40 mg Subcutaneous Q24H  . feeding supplement (OSMOLITE 1.5 CAL)  1,120 mL Per Tube Q24H  . feeding supplement (PROSource TF)  45 mL Per Tube QID  . fiber  1 packet Per Tube BID  . folic acid  1 mg Per Tube Daily  . mouth rinse  15 mL Mouth Rinse q12n4p  . multivitamin with minerals  1 tablet Oral Daily  . potassium chloride  20 mEq Per NG tube Daily  . QUEtiapine  100 mg Per Tube QHS  . sodium bicarbonate  650 mg Per Tube TID  . thiamine  100 mg Per Tube Daily   Or  . thiamine  100 mg Intravenous Daily    Antimicrobials: Anti-infectives (From admission, onward)   Start     Dose/Rate Route Frequency Ordered Stop   11/09/20 0800  vancomycin (VANCOCIN) IVPB 1000 mg/200 mL premix  Status:  Discontinued        1,000 mg 200 mL/hr over 60 Minutes Intravenous Every 8 hours 11/09/20 0051 11/11/20 1656   11/09/20  0800  piperacillin-tazobactam (ZOSYN) IVPB 3.375 g        3.375 g 12.5 mL/hr over 240 Minutes Intravenous Every 8 hours 11/09/20 0051 11/13/20 2029   11/09/20 0145  vancomycin (VANCOREADY) IVPB 2000 mg/400 mL        2,000 mg 200 mL/hr over 120 Minutes Intravenous  Once 11/09/20 0051 11/09/20 0343   11/09/20 0145  piperacillin-tazobactam (ZOSYN) IVPB 3.375 g        3.375 g 100 mL/hr over 30 Minutes Intravenous  Once 11/09/20 0051 11/09/20 0221      PRN meds: acetaminophen (TYLENOL) oral liquid 160 mg/5 mL, docusate, Gerhardt's butt cream, loperamide, ondansetron (ZOFRAN) IV, polyethylene glycol, Resource ThickenUp Clear, sodium chloride flush   Objective: Vitals:   11/14/20 0800 11/14/20 1159  BP: 128/76  131/87  Pulse: 87 85  Resp: 17 17  Temp:  97.6 F (36.4 C)  SpO2: 94% 96%    Intake/Output Summary (Last 24 hours) at 11/14/2020 1431 Last data filed at 11/14/2020 0920 Gross per 24 hour  Intake 615.33 ml  Output 2375 ml  Net -1759.67 ml   Filed Weights   11/12/20 0344 11/13/20 0420 11/14/20 0600  Weight: 95.1 kg 92.1 kg 92.1 kg   Weight change: 0 kg Body mass index is 25.38 kg/m.   Physical Exam: General exam: Young Caucasian male.  Not in physical distress Skin: No rashes, lesions or ulcers. HEENT: Atraumatic, normocephalic, no obvious bleeding.  Has Dobbhoff in place Lungs: Clear to auscultation bilaterally CVS: Tachycardic, no murmur GI/Abd soft, nontender, nondistended, bowel sound present CNS: Sleeping, arousable.  Most of the times awake in the day.  Not restless or agitated. Psychiatry: Mood appropriate Extremities: No pedal edema, no calf tenderness  Data Review: I have personally reviewed the laboratory data and studies available.  Recent Labs  Lab 11/08/20 0430 11/09/20 1549 11/10/20 0413 11/11/20 0831  WBC 13.6* 11.6* 10.3 9.6  NEUTROABS  --   --   --  5.3  HGB 11.9* 12.6* 12.0* 12.6*  HCT 37.5* 38.4* 38.7* 38.5*  MCV 93.5 91.9 94.4 91.2  PLT  127* 176 169 224   Recent Labs  Lab 11/09/20 1549 11/09/20 1549 11/10/20 0413 11/11/20 0831 11/12/20 0206 11/13/20 0142 11/14/20 0454  NA 146*   < > 143 142 141 141 139  K 4.0   < > 3.7 3.7 4.0 4.0 4.1  CL 115*   < > 113* 113* 111 109 106  CO2 19*   < > 18* 21* 21* 20* 21*  GLUCOSE 129*   < > 128* 131* 103* 113* 127*  BUN 18   < > 19 14 14 16 12   CREATININE 0.85   < > 0.85 0.65 0.60* 0.60* 0.59*  CALCIUM 8.4*   < > 8.2* 8.2* 8.6* 8.6* 9.0  MG 2.2  --  1.9 1.8 2.1  --   --   PHOS 2.5  --   --  2.9  --   --  3.0   < > = values in this interval not displayed.    F/u labs ordered.  Signed, , MD [facility] 11/14/2020

## 2020-11-14 NOTE — Progress Notes (Signed)
Pt removed Flexiseal and had episode of stool incontinence. Pt refused to allow RN to reinsert Flexiseal. Became agitated and combative to RN and NT. Advised pt to call for bedpan if he felt he needed to have bowel movement.

## 2020-11-14 NOTE — Plan of Care (Signed)
  Problem: Health Behavior/Discharge Planning: Goal: Ability to manage health-related needs will improve Outcome: Progressing   Problem: Clinical Measurements: Goal: Ability to maintain clinical measurements within normal limits will improve Outcome: Progressing   Problem: Activity: Goal: Risk for activity intolerance will decrease Outcome: Progressing   Problem: Nutrition: Goal: Adequate nutrition will be maintained Outcome: Progressing   Problem: Pain Managment: Goal: General experience of comfort will improve Outcome: Progressing   

## 2020-11-15 LAB — GLUCOSE, CAPILLARY
Glucose-Capillary: 115 mg/dL — ABNORMAL HIGH (ref 70–99)
Glucose-Capillary: 108 mg/dL — ABNORMAL HIGH (ref 70–99)
Glucose-Capillary: 121 mg/dL — ABNORMAL HIGH (ref 70–99)
Glucose-Capillary: 136 mg/dL — ABNORMAL HIGH (ref 70–99)
Glucose-Capillary: 169 mg/dL — ABNORMAL HIGH (ref 70–99)
Glucose-Capillary: 144 mg/dL — ABNORMAL HIGH (ref 70–99)

## 2020-11-15 MED ORDER — LOPERAMIDE HCL 1 MG/7.5ML PO SUSP
2.0000 mg | Freq: Four times a day (QID) | ORAL | Status: DC | PRN
  Administered 2020-11-15: 2 mg
  Filled 2020-11-15 (×2): qty 15

## 2020-11-15 NOTE — Progress Notes (Addendum)
Physical Therapy Treatment Patient Details Name: Terrence Henry MRN: [id number] DOB: 1981/07/10 Today's Date: 11/15/2020    History of Present Illness 39 year old male with polysubstance abuse who presented in the emergency department cardiac arrest.    PT Comments    Pt supine in bed on arrival.  He was eager to participate in PT session.  Pt performed gt training in halls with noticeable LOB with head turns as well as mild scissoring.  Pt would benefit from rehab in a more aggressive setting and therefore recommendations are updated to CIR at this time. Will inform supervising PT of need for change in recommendations at this time.     Follow Up Recommendations  CIR     Equipment Recommendations  Rolling walker with 5" wheels;3in1 (PT)    Recommendations for Other Services       Precautions / Restrictions Precautions Precautions: Fall Precaution Comments: mitts, restraints, NG tube Restrictions Weight Bearing Restrictions: No Other Position/Activity Restrictions: foley    Mobility  Bed Mobility Overal bed mobility: Needs Assistance Bed Mobility: Supine to Sit;Sit to Supine     Supine to sit: Supervision Sit to supine: Supervision   General bed mobility comments: Pt required cues for safety to move from supine <>sit.  Transfers Overall transfer level: Needs assistance Equipment used: Rolling walker (2 wheeled) Transfers: Sit to/from Stand Sit to Stand: Min assist         General transfer comment: Cues for hand placement.  Poor safety backing to seated surface.  Crawling into bed forwards and swing hips to sit on surface.  Ambulation/Gait Ambulation/Gait assistance: Min assist Gait Distance (Feet): 80 Feet Assistive device: Rolling walker (2 wheeled) Gait Pattern/deviations: Step-through pattern;Decreased stride length;Ataxic;Staggering left;Staggering right;Narrow base of support Gait velocity: decreased   General Gait Details: Pt demonstrated poor ability  to keep RW grounded,  LOB noted with head turns and mild scissoring with gt remains.   Stairs             Wheelchair Mobility    Modified Rankin (Stroke Patients Only)       Balance Overall balance assessment: Needs assistance   Sitting balance-Leahy Scale: Good       Standing balance-Leahy Scale: Poor Standing balance comment: BUE support and external assistance.                            Cognition Arousal/Alertness: Awake/alert Behavior During Therapy: Restless;WFL for tasks assessed/performed Overall Cognitive Status: Impaired/Different from baseline (not formally assessed but able to follow all commands during session.) Area of Impairment: Safety/judgement;Problem solving                         Safety/Judgement: Decreased awareness of safety;Decreased awareness of deficits   Problem Solving: Slow processing;Decreased initiation;Difficulty sequencing;Requires verbal cues        Exercises General Exercises - Lower Extremity Hip ABduction/ADduction: AROM;Both;10 reps;Standing Heel Raises: AROM;Both;10 reps;Standing Mini-Sqauts: AROM;Both;10 reps;Standing    General Comments        Pertinent Vitals/Pain Pain Assessment: No/denies pain    Home Living                      Prior Function            PT Goals (current goals can now be found in the care plan section) Acute Rehab PT Goals Patient Stated Goal: to get water/something to eat Potential to Achieve Goals: Fair  Progress towards PT goals: Progressing toward goals    Frequency    Min 3X/week      PT Plan Current plan remains appropriate    Co-evaluation              AM-PAC PT "6 Clicks" Mobility   Outcome Measure  Help needed turning from your back to your side while in a flat bed without using bedrails?: None Help needed moving from lying on your back to sitting on the side of a flat bed without using bedrails?: None Help needed moving to and  from a bed to a chair (including a wheelchair)?: A Little Help needed standing up from a chair using your arms (e.g., wheelchair or bedside chair)?: A Little Help needed to walk in hospital room?: A Little Help needed climbing 3-5 steps with a railing? : A Little 6 Click Score: 20    End of Session Equipment Utilized During Treatment: Gait belt Activity Tolerance: Patient tolerated treatment well Patient left: in bed;with call bell/phone within reach (sitter at bedside so bed alarm not set.) Nurse Communication: Mobility status PT Visit Diagnosis: Unsteadiness on feet (R26.81);Other abnormalities of gait and mobility (R26.89);Muscle weakness (generalized) (M62.81)     Time: 4235-3614 PT Time Calculation (min) (ACUTE ONLY): 17 min  Charges:  $Gait Training: 8-22 mins                     Bonney Leitz , PTA Acute Rehabilitation Services Pager [phone] Office [phone]     Abygale Karpf Artis Delay 11/15/2020, 5:09 PM

## 2020-11-15 NOTE — Progress Notes (Signed)
Pt having diarrhea administered prn dose of Imodium.

## 2020-11-15 NOTE — Progress Notes (Signed)
OT Cancellation Note  Patient Details Name: Terrence Henry MRN: [id number] DOB: 06-16-1981   Cancelled Treatment:    Reason Eval/Treat Not Completed: Patient declined, no reason specified Pt not wanting to work with therapy when presented opportunity, requesting to come back later if time allowed. Therapy will continue to follow.   [name], COTA/L Acute Rehabilitation Services [phone]  [name] 11/15/2020, 11:34 AM

## 2020-11-15 NOTE — Progress Notes (Signed)
PROGRESS NOTE  Terrence Henry  DOB: [birth year]  PCP: Patient, No Pcp Per [id number]  DOA: 10/31/2020  LOS: 15 days   Chief complaint: Cardiac arrest  Brief narrative: Terrence Henry is a 39 y.o. male with PMH significant for polysubstance abuse On 10/31/2020, patient was brought to the ED as a cardiac arrest.  He was found unconscious by family in the bathroom with needles around.  Unknown downtime.  Family given Narcan without success before EMS arrived.  EMS achieved ROSC after 20 minutes of CPR, epinephrine x5 and defibrillation. Brought in to the ED with a King airway, in normal sinus rhythm and blood pressure low in 60s.  In the ED, he was intubated for a low GCS of 3, started on IV fluids and Levophed. Per cardiology, primary cause of cardiac arrest was not cardiac in origin and rather likely to be an overdose, hence patient did not have an emergent cardiac cath.  Echocardiogram showed EF of 40 to 45% with global hypokinesis and grade 1 diastolic dysfunction. Admitted to ICU, on mechanical ventilation, therapeutic hypothermia protocol. Extubated on 11/27. Transferred to hospitalist service on 12/2.  Subjective: Patient was seen and examined this morning.  Alert, awake, slow to respond but oriented x3.  Has core track tube Remains on bilateral wrist restraints.  No new issues overnight per RN.  Assessment/Plan: Cardiac arrest   Cardiogenic shock postarrest Acute hypoxic hypercapnic respiratory failure -Cardiac arrest likely due to heroin overdose.  Successfully resuscitated by EMS prior to arrival.   -Initially admitted to ICU, on mechanical ventilation, therapeutic hypothermia protocol. -Required IV fluid and pressors. Hemodynamics improved. -Currently normal sinus rhythm, stable blood pressure and breathing in room air.  Hypoxic ischemic encephalopathy Intermittent delirium  -Secondary to unknown downtime during cardiac arrest -Patient seems to have mild cognitive  impairment and goes through intermittent delirium. Per staff, last night patient was calm and not restless. -Currently on Seroquel, as needed Ativan. -Frequent reorientation.  I would minimize the use of sedating meds in the morning. -Continue one-to-one sitter  Diarrhea Non-anion gap metabolic acidosis -I have started patient on scheduled Imodium but patient continues to have diarrhea.  To have diarrhea -GI pathogen panel negative. -Diarrhea most likely related to feeding.  Currently on scheduled Questran as needed Imodium.  Continue to monitor.  Acute kidney injury -Creatinine peaked at 2.53 secondary to shock.  Creatinine improved back to normal.  Continue to monitor Recent Labs    11/06/20 0346 11/06/20 1946 11/07/20 0315 11/08/20 0430 11/09/20 1549 11/10/20 0413 11/11/20 0831 11/12/20 0206 11/13/20 0142 11/14/20 0454  BUN 35* 32* 27* 24* 18 19 14 14 16 12   CREATININE 1.26* 1.02 0.94 0.94 0.85 0.85 0.65 0.60* 0.60* 0.59*   Hypokalemia/hypomagnesemia -Potassium and magnesium level improved with replacement. -Continue to check periodically. Recent Labs  Lab 11/09/20 1549 11/09/20 1549 11/10/20 0413 11/11/20 0831 11/12/20 0206 11/13/20 0142 11/14/20 0454  K 4.0   < > 3.7 3.7 4.0 4.0 4.1  MG 2.2  --  1.9 1.8 2.1  --   --   PHOS 2.5  --   --  2.9  --   --  3.0   < > = values in this interval not displayed.   Physical deconditioning -Out of bed. PT/OT eval. -He stood up with physical therapy today.  Polysubstance abuse -Counseled to quit  SIRS -On 11/30, had a fever of 102.9. -Unclear source of infection -Blood culture sent.  Patient was started on broader spectrum antibiotic with  vancomycin and Zosyn. -Afebrile for last 4 days hours at least.  Completed course of Zosyn and vancomycin.-Continue to monitor.  Hypoalbuminemia -Albumin level 1.8.  Nutrition consulted.  Currently on tube feeding.  Mobility: PT follow-up needed Code Status:   Code Status: Full  Code  Nutritional status: Body mass index is 25.85 kg/m. Nutrition Problem: Increased nutrient needs Etiology: acute illness Signs/Symptoms: estimated needs Diet Order            DIET DYS 2 Room service appropriate? No; Fluid consistency: Nectar Thick  Diet effective now                DVT prophylaxis: enoxaparin (LOVENOX) injection 40 mg Start: 10/31/20 1630   Antimicrobials:  None Fluid: None Consultants: Critical care signed off Family Communication:  Discussed with patient's mother and patient's sister  Status is: Inpatient  Remains inpatient appropriate because: Remains altered  Dispo: The patient is from: Home              Anticipated d/c is to: Likely will need nursing home              Anticipated d/c date is: > 3 days              Patient currently is not medically stable to d/c.     Infusions:  . sodium chloride Stopped (11/06/20 9371)    Scheduled Meds: . chlorhexidine  15 mL Mouth Rinse BID  . Chlorhexidine Gluconate Cloth  6 each Topical Daily  . cholestyramine  4 g Oral BID  . clonazePAM  1 mg Per Tube QHS  . enoxaparin (LOVENOX) injection  40 mg Subcutaneous Q24H  . feeding supplement (OSMOLITE 1.5 CAL)  1,120 mL Per Tube Q24H  . feeding supplement (PROSource TF)  45 mL Per Tube QID  . fiber  1 packet Per Tube BID  . folic acid  1 mg Per Tube Daily  . mouth rinse  15 mL Mouth Rinse q12n4p  . multivitamin with minerals  1 tablet Per Tube Daily  . potassium chloride  20 mEq Per NG tube Daily  . QUEtiapine  100 mg Per Tube QHS  . sodium bicarbonate  650 mg Per Tube TID  . thiamine  100 mg Per Tube Daily   Or  . thiamine  100 mg Intravenous Daily    Antimicrobials: Anti-infectives (From admission, onward)   Start     Dose/Rate Route Frequency Ordered Stop   11/09/20 0800  vancomycin (VANCOCIN) IVPB 1000 mg/200 mL premix  Status:  Discontinued        1,000 mg 200 mL/hr over 60 Minutes Intravenous Every 8 hours 11/09/20 0051 11/11/20 1656    11/09/20 0800  piperacillin-tazobactam (ZOSYN) IVPB 3.375 g        3.375 g 12.5 mL/hr over 240 Minutes Intravenous Every 8 hours 11/09/20 0051 11/13/20 2029   11/09/20 0145  vancomycin (VANCOREADY) IVPB 2000 mg/400 mL        2,000 mg 200 mL/hr over 120 Minutes Intravenous  Once 11/09/20 0051 11/09/20 0343   11/09/20 0145  piperacillin-tazobactam (ZOSYN) IVPB 3.375 g        3.375 g 100 mL/hr over 30 Minutes Intravenous  Once 11/09/20 0051 11/09/20 0221      PRN meds: acetaminophen (TYLENOL) oral liquid 160 mg/5 mL, docusate, Gerhardt's butt cream, loperamide, ondansetron (ZOFRAN) IV, polyethylene glycol, Resource ThickenUp Clear, sodium chloride flush   Objective: Vitals:   11/15/20 0725 11/15/20 1140  BP: (!) 135/91 (!) 144/85  Pulse: 80 100  Resp: 19 18  Temp: 98.1 F (36.7 C) 98.6 F (37 C)  SpO2: 93% 96%    Intake/Output Summary (Last 24 hours) at 11/15/2020 1332 Last data filed at 11/15/2020 0810 Gross per 24 hour  Intake 830 ml  Output 1800 ml  Net -970 ml   Filed Weights   11/13/20 0420 11/14/20 0600 11/15/20 0425  Weight: 92.1 kg 92.1 kg 93.8 kg   Weight change: 1.7 kg Body mass index is 25.85 kg/m.   Physical Exam: General exam: Young Caucasian male.  Not in physical distress Skin: No rashes, lesions or ulcers. HEENT: Atraumatic, normocephalic, no obvious bleeding.  Has Dobbhoff in place Lungs: Clear to auscultation bilaterally CVS: Tachycardic, no murmur GI/Abd soft, nontender, nondistended, bowel sound present CNS: Sleeping, arousable.  Most of the times awake in the day.  Not restless or agitated. Psychiatry: Mood appropriate Extremities: No pedal edema, no calf tenderness  Data Review: I have personally reviewed the laboratory data and studies available.  Recent Labs  Lab 11/09/20 1549 11/10/20 0413 11/11/20 0831  WBC 11.6* 10.3 9.6  NEUTROABS  --   --  5.3  HGB 12.6* 12.0* 12.6*  HCT 38.4* 38.7* 38.5*  MCV 91.9 94.4 91.2  PLT 176 169 224    Recent Labs  Lab 11/09/20 1549 11/09/20 1549 11/10/20 0413 11/11/20 0831 11/12/20 0206 11/13/20 0142 11/14/20 0454  NA 146*   < > 143 142 141 141 139  K 4.0   < > 3.7 3.7 4.0 4.0 4.1  CL 115*   < > 113* 113* 111 109 106  CO2 19*   < > 18* 21* 21* 20* 21*  GLUCOSE 129*   < > 128* 131* 103* 113* 127*  BUN 18   < > 19 14 14 16 12   CREATININE 0.85   < > 0.85 0.65 0.60* 0.60* 0.59*  CALCIUM 8.4*   < > 8.2* 8.2* 8.6* 8.6* 9.0  MG 2.2  --  1.9 1.8 2.1  --   --   PHOS 2.5  --   --  2.9  --   --  3.0   < > = values in this interval not displayed.    F/u labs ordered.  Signed, , MD [facility] 11/15/2020

## 2020-11-15 NOTE — Progress Notes (Signed)
  Speech Language Pathology Treatment: Dysphagia  Patient Details Name: Terrence Henry MRN: [id number] DOB: [birth year] Today's Date: 11/15/2020 Time: 2595-6387 SLP Time Calculation (min) (ACUTE ONLY): 10 min  Assessment / Plan / Recommendation Clinical Impression  Pt was seen for dysphagia treatment. Nursing reported that the pt has been tolerating the current diet without overt s/sx of aspiration. Pt's vocal quality and mentation appear improved compared to 12/3. He was able to respond appropriately to SLP's questions with intelligible speech. Pt stated that the food has been "terrible" and that he just wants to have some regular water. He tolerated regular texture solids, mixed consistency boluses (i.e., fruit cocktail), and thin liquids via cup and straw without overt s/sx of aspiration when bolus size and rate were regulated by SLP. Mastication was [facility] and no significant oral residue was noted. Symptoms of pharyngeal residue were not observed. Pt did exhibit a cough following completion of the fruit cocktail which he consumed at a rapid rate. Aspiration due to spillage of liquids during mastication is suspected. Pt's diet will be advanced to regular texture solids and thin liquids. However, considering the pt's impulsive tendencies, supervision is recommended to ensure observance of swallowing precautions. SLP will continue to follow pt.    HPI HPI: Pt is a 39 year old male with history of polysubstance abuse who presented after he was found down in the bathroom with needles around, he was given Narcan by family before EMS arrived. ROSC achieved after 20 minutes of CPR in the ED. CT head was negative. ETT 11/21-11/27 for a low GCS of 3. Abdominal x-ray 12/1 questioned a degree of underlying ileus. Cortrak placed 12/1. MBS 12/3: pharyngeal dysphagia characterized by reduced lingual retraction, reduced anterior laryngeal movement, and a pharyngeal delay. Penetration and sensed aspiration noted with thin  liquids.       SLP Plan  Continue with current plan of care       Recommendations  Diet recommendations: Regular;Thin liquid Liquids provided via: Cup;Straw Medication Administration: Whole meds with puree Supervision: Staff to assist with self feeding;Intermittent supervision to cue for compensatory strategies Compensations: Slow rate;Small sips/bites Postural Changes and/or Swallow Maneuvers: Seated upright 90 degrees                Oral Care Recommendations: Oral care BID Follow up Recommendations: None SLP Visit Diagnosis: Dysphagia, pharyngeal phase (R13.13) Plan: Continue with current plan of care       Terrence Henry Clock, MS, CCC-SLP Acute Rehabilitation Services Office number [phone] Pager [phone]                Scheryl Marten 11/15/2020, 4:24 PM

## 2020-11-15 NOTE — Progress Notes (Signed)
Inpatient Rehabilitation Admissions Coordinator   Inpatient rehab consult received. I left a voicemail for patient's Mom, Lawson Fiscal, to contact me to discuss prior living and to assess discharge rehab options. I await call back.  Ottie Glazier, RN, MSN Rehab Admissions Coordinator [phone] 11/15/2020 3:01 PM

## 2020-11-16 LAB — GLUCOSE, CAPILLARY
Glucose-Capillary: 153 mg/dL — ABNORMAL HIGH (ref 70–99)
Glucose-Capillary: 113 mg/dL — ABNORMAL HIGH (ref 70–99)
Glucose-Capillary: 99 mg/dL (ref 70–99)
Glucose-Capillary: 130 mg/dL — ABNORMAL HIGH (ref 70–99)
Glucose-Capillary: 117 mg/dL — ABNORMAL HIGH (ref 70–99)

## 2020-11-16 MED ORDER — ENSURE ENLIVE PO LIQD
237.0000 mL | Freq: Three times a day (TID) | ORAL | Status: DC
  Administered 2020-11-16 – 2020-11-18 (×4): 237 mL via ORAL

## 2020-11-16 NOTE — Progress Notes (Signed)
Cortrak removed as ordered.  Patient tolerated well will monitor. Terrence Henry, Randall An RN

## 2020-11-16 NOTE — Progress Notes (Signed)
Nutrition Follow-up  DOCUMENTATION CODES:   Not applicable  INTERVENTION:    Ensure Enlive po TID, each supplement provides 350 kcal and 20 grams of protein  Magic cup TID with meals, each supplement provides 290 kcal and 9 grams of protein  MVI daily  NUTRITION DIAGNOSIS:   Increased nutrient needs related to acute illness as evidenced by estimated needs.  Ongoing  GOAL:   Patient will meet greater than or equal to 90% of their needs  Progressing  MONITOR:   Vent status, Skin, TF tolerance, Weight trends, Labs, I & O's  REASON FOR ASSESSMENT:   Ventilator    ASSESSMENT:   Patient with PMH significant for polysubstance use and inguinal hernia. Presents this admission with asystolic cardiac arrest 2/2 to suspect OD.   11/27- extubated  12/01- gastric Cortrak placed 12/3- Cortrak pulled out, replaced, diet advanced to DYS 2/nectar thick liquids  12/6- diet advanced to regular, thin liquids  Pt reports appetite has progressed significantly. Last three meal completions charted as 100%, 100%, and 50%. Does not enjoy taste of hospital food but is willing to eat it. Discussed the importance of protein intake for preservation of lean body mass. Provided pt Ensure at bedside to try. Was in bathroom at the time.   Cortrak removed. RD to encourage PO intake.   Admission weight: 106.7 kg Current weight: 93 kg   UOP: 1000 ml x 24 hrs   Medications: questran, folic acid, MVI with minerals, 20 mEq KCl daily, thiamine Labs: CBG 105-160  Diet Order:   Diet Order            Diet regular Room service appropriate? No; Fluid consistency: Thin  Diet effective now                 EDUCATION NEEDS:   Not appropriate for education at this time  Skin:  Skin Assessment: Reviewed RN Assessment  Last BM:  12/7  Height:   Ht Readings from Last 1 Encounters:  10/31/20 6\' 3"  (1.905 m)    Weight:   Wt Readings from Last 1 Encounters:  11/16/20 93 kg    BMI:  Body  mass index is 25.63 kg/m.  Estimated Nutritional Needs:   Kcal:  2500-2700 kcal  Protein:  125-150 grams  Fluid:  >/= 2 L/day  14/07/21 RD, LDN Clinical Nutrition Pager listed in AMION

## 2020-11-16 NOTE — Progress Notes (Signed)
Inpatient Rehabilitation Admissions Coordinator  I met with patient and his Mom at bedside for rehab assessment. Mom would like to discuss with MD his cognition issues and what to expect moving forward, would like to meet with financial counselor, and would like to schedule family meeting with she and her daughter to further discuss his rehab options for physical rehabilitation moving further. I provided my number so that she can discuss with her daughter and arrange further discussions. I will contact financial counselor.  Danne Baxter, RN, MSN Rehab Admissions Coordinator [phone] 11/16/2020 1:59 PM

## 2020-11-16 NOTE — Progress Notes (Signed)
PROGRESS NOTE  Terrence Henry  DOB: 1981-05-14  PCP: Patient, No Pcp Per XNA:[id number]  DOA: 10/31/2020  LOS: 16 days   Chief complaint: Cardiac arrest  Brief narrative: Terrence Henry is a 39 y.o. male with PMH significant for polysubstance abuse On 10/31/2020, patient was brought to the ED as a cardiac arrest.  He was found unconscious by family in the bathroom with needles around.  Unknown downtime.  Family given Narcan without success before EMS arrived.  EMS achieved ROSC after 20 minutes of CPR, epinephrine x5 and defibrillation. Brought in to the ED with a King airway, in normal sinus rhythm and blood pressure low in 60s.  In the ED, he was intubated for a low GCS of 3, started on IV fluids and Levophed. Per cardiology, primary cause of cardiac arrest was not cardiac in origin and rather likely to be an overdose, hence patient did not have an emergent cardiac cath.  Echocardiogram showed EF of 40 to 45% with global hypokinesis and grade 1 diastolic dysfunction. Admitted to ICU, on mechanical ventilation, therapeutic hypothermia protocol. Extubated on 11/27. Transferred to hospitalist service on 12/2.  Subjective: Patient was seen and examined this morning.  Lying down in bed.  Not in distress.  Not in restraints since yesterday.   His speech is getting better.  Per speech therapist, his swallowing has significantly improved.  Core track pulled out today.  Assessment/Plan: Cardiac arrest   Cardiogenic shock postarrest Acute hypoxic hypercapnic respiratory failure -Cardiac arrest likely due to heroin overdose.  Successfully resuscitated by EMS prior to arrival.   -Initially admitted to ICU, on mechanical ventilation, therapeutic hypothermia protocol. -Required IV fluid and pressors. Hemodynamics improved. -Currently normal sinus rhythm, stable blood pressure and breathing in room air.  Hypoxic ischemic encephalopathy Intermittent delirium  -Secondary to unknown downtime  during cardiac arrest -Mental status has gradually improved.  Much better today.  Off restraints for last 24 hours.  Currently on Seroquel 100 mg nightly.  Can taper down gradually.    Diarrhea Non-anion gap metabolic acidosis -GI pathogen panel negative.  Suspected to be related to tube feeding.   -Diarrhea improving since Questran was started.  Off tube feeding now.   -I will stop Questran as well.  Please resume if needed.  Acute kidney injury -Creatinine peaked at 2.53 secondary to shock.  Creatinine improved back to normal.  Continue to monitor  Physical deconditioning -Pending CIR  Polysubstance abuse -Counseled to quit  Mobility: PT follow-up needed Code Status:   Code Status: Full Code  Nutritional status: Body mass index is 25.63 kg/m. Nutrition Problem: Increased nutrient needs Etiology: acute illness Signs/Symptoms: estimated needs Diet Order            Diet regular Room service appropriate? No; Fluid consistency: Thin  Diet effective now                DVT prophylaxis: enoxaparin (LOVENOX) injection 40 mg Start: 10/31/20 1630   Antimicrobials:  None Fluid: None Consultants: Critical care signed off Family Communication:  Discussed with patient's mother and patient's sister  Status is: Inpatient  Remains inpatient appropriate because: Pending CIR  Dispo: The patient is from: Home              Anticipated d/c is to: CIR              Anticipated d/c date is: When bed available              Patient currently  is medically stable to d/c.   Infusions:  . sodium chloride Stopped (11/06/20 8588)    Scheduled Meds: . chlorhexidine  15 mL Mouth Rinse BID  . Chlorhexidine Gluconate Cloth  6 each Topical Daily  . clonazePAM  1 mg Per Tube QHS  . enoxaparin (LOVENOX) injection  40 mg Subcutaneous Q24H  . feeding supplement  237 mL Oral TID BM  . folic acid  1 mg Per Tube Daily  . mouth rinse  15 mL Mouth Rinse q12n4p  . multivitamin with minerals  1  tablet Per Tube Daily  . potassium chloride  20 mEq Per NG tube Daily  . QUEtiapine  100 mg Per Tube QHS  . sodium bicarbonate  650 mg Per Tube TID  . thiamine  100 mg Per Tube Daily   Or  . thiamine  100 mg Intravenous Daily    Antimicrobials: Anti-infectives (From admission, onward)   Start     Dose/Rate Route Frequency Ordered Stop   11/09/20 0800  vancomycin (VANCOCIN) IVPB 1000 mg/200 mL premix  Status:  Discontinued        1,000 mg 200 mL/hr over 60 Minutes Intravenous Every 8 hours 11/09/20 0051 11/11/20 1656   11/09/20 0800  piperacillin-tazobactam (ZOSYN) IVPB 3.375 g        3.375 g 12.5 mL/hr over 240 Minutes Intravenous Every 8 hours 11/09/20 0051 11/13/20 2029   11/09/20 0145  vancomycin (VANCOREADY) IVPB 2000 mg/400 mL        2,000 mg 200 mL/hr over 120 Minutes Intravenous  Once 11/09/20 0051 11/09/20 0343   11/09/20 0145  piperacillin-tazobactam (ZOSYN) IVPB 3.375 g        3.375 g 100 mL/hr over 30 Minutes Intravenous  Once 11/09/20 0051 11/09/20 0221      PRN meds: acetaminophen (TYLENOL) oral liquid 160 mg/5 mL, docusate, Gerhardt's butt cream, loperamide HCl, ondansetron (ZOFRAN) IV, polyethylene glycol, Resource ThickenUp Clear, sodium chloride flush   Objective: Vitals:   11/16/20 1209 11/16/20 1614  BP: 109/90 133/81  Pulse: 66 95  Resp: 20 20  Temp: 98.2 F (36.8 C) 98.1 F (36.7 C)  SpO2: 100% 100%    Intake/Output Summary (Last 24 hours) at 11/16/2020 1651 Last data filed at 11/16/2020 1600 Gross per 24 hour  Intake 480 ml  Output --  Net 480 ml   Filed Weights   11/14/20 0600 11/15/20 0425 11/16/20 0410  Weight: 92.1 kg 93.8 kg 93 kg   Weight change: -0.8 kg Body mass index is 25.63 kg/m.   Physical Exam: General exam: Young Caucasian male.  Not in physical distress Skin: No rashes, lesions or ulcers. HEENT: Atraumatic, normocephalic, no obvious bleeding.  Has Dobbhoff in place Lungs: Clear to auscultation bilaterally CVS:  Tachycardic, no murmur GI/Abd soft, nontender, nondistended, bowel sound present CNS: Alert, awake, oriented x3  psychiatry: Mood appropriate Extremities: No pedal edema, no calf tenderness  Data Review: I have personally reviewed the laboratory data and studies available.  Recent Labs  Lab 11/10/20 0413 11/11/20 0831  WBC 10.3 9.6  NEUTROABS  --  5.3  HGB 12.0* 12.6*  HCT 38.7* 38.5*  MCV 94.4 91.2  PLT 169 224   Recent Labs  Lab 11/10/20 0413 11/11/20 0831 11/12/20 0206 11/13/20 0142 11/14/20 0454  NA 143 142 141 141 139  K 3.7 3.7 4.0 4.0 4.1  CL 113* 113* 111 109 106  CO2 18* 21* 21* 20* 21*  GLUCOSE 128* 131* 103* 113* 127*  BUN 19 14  14 16 12   CREATININE 0.85 0.65 0.60* 0.60* 0.59*  CALCIUM 8.2* 8.2* 8.6* 8.6* 9.0  MG 1.9 1.8 2.1  --   --   PHOS  --  2.9  --   --  3.0    F/u labs ordered.  Signed, , MD [facility] 11/16/2020

## 2020-11-16 NOTE — Progress Notes (Signed)
Physical Therapy Treatment Patient Details Name: Terrence Henry MRN: [id number] DOB: 04/25/1981 Today's Date: 11/16/2020    History of Present Illness 39 year old male with polysubstance abuse who presented in the emergency department cardiac arrest.    PT Comments    Pt initially refuses therapy as he is upset that his lunch never came. Suggested walking to RN station to get menu to order for himself. Pt agreeable. Pt is supervision for bed mobility. Pt is slightly impulsive not wait for RW in place before getting up, however static standing is improved.  Pt requires cues for straight line ambulation with wider BoS and is light min A for steadying. Pt with increased difficulty trying to turn at the end of hall, unable to coordinate UE and RW with feet causing LoB requiring modA for steadying. Improved with turning at the other end of hallway, however pt still require assist. With return to room, assisted pt in ordering his lunch. PT continues to recommend CIR given coordination, problem solving and sequencing problems that result in decreased safety with ambulation. PT will continue to follow acutely.    Follow Up Recommendations  CIR     Equipment Recommendations  Rolling walker with 5" wheels;3in1 (PT)       Precautions / Restrictions Precautions Precautions: Fall    Mobility  Bed Mobility Overal bed mobility: Needs Assistance Bed Mobility: Supine to Sit;Sit to Supine     Supine to sit: Supervision Sit to supine: Supervision   General bed mobility comments: supervision for safety   Transfers Overall transfer level: Needs assistance Equipment used: None Transfers: Sit to/from Stand Sit to Stand: Min guard         General transfer comment: able to come to static standing without assist before RW in place able to static stand until PT moved RW in front of him  Ambulation/Gait Ambulation/Gait assistance: Min assist;Mod assist Gait Distance (Feet): 470 Feet Assistive  device: Rolling walker (2 wheeled) Gait Pattern/deviations: Step-through pattern;Decreased stride length;Ataxic;Staggering left;Staggering right;Narrow base of support Gait velocity: decreased Gait velocity interpretation: <1.8 ft/sec, indicate of risk for recurrent falls General Gait Details: min A for steadying due to scissoring, maximal vc initially for widening BoS, then able to handle straight line walking, requires constant cuing for proximity to RW, with turning at end of hallway pt unable to coordinate movement of RW and LE, causing him to have LoB requiring modA for steadying, vc for breaking down task with turning at other end of hall, still has LoB requiring min A        Balance Overall balance assessment: Needs assistance   Sitting balance-Leahy Scale: Good       Standing balance-Leahy Scale: Fair Standing balance comment: can not accept any challenge                            Cognition Arousal/Alertness: Awake/alert Behavior During Therapy: Restless;WFL for tasks assessed/performed Overall Cognitive Status: Impaired/Different from baseline (not formally assessed but able to follow all commands during session.) Area of Impairment: Safety/judgement;Problem solving                         Safety/Judgement: Decreased awareness of safety;Decreased awareness of deficits Awareness: Emergent Problem Solving: Slow processing;Decreased initiation;Difficulty sequencing;Requires verbal cues General Comments: pt did not have lunched, worked to problem solve, to get menu and place call to kitchen, requires cuing  General Comments General comments (skin integrity, edema, etc.): VSS on RA, worked on problem solving as his lunch had not arrived      Pertinent Vitals/Pain Pain Assessment: 0-10 Pain Score: 7  Pain Location: chest from compressions Pain Descriptors / Indicators: Aching;Sore;Grimacing Pain Intervention(s): Limited activity within  patient's tolerance;Monitored during session;Repositioned           PT Goals (current goals can now be found in the care plan section) Acute Rehab PT Goals Patient Stated Goal: get lunch  PT Goal Formulation: With family Time For Goal Achievement: 11/25/20 Potential to Achieve Goals: Fair Progress towards PT goals: Progressing toward goals    Frequency    Min 3X/week      PT Plan Current plan remains appropriate       AM-PAC PT "6 Clicks" Mobility   Outcome Measure  Help needed turning from your back to your side while in a flat bed without using bedrails?: None Help needed moving from lying on your back to sitting on the side of a flat bed without using bedrails?: None Help needed moving to and from a bed to a chair (including a wheelchair)?: A Little Help needed standing up from a chair using your arms (e.g., wheelchair or bedside chair)?: A Little Help needed to walk in hospital room?: A Little Help needed climbing 3-5 steps with a railing? : A Little 6 Click Score: 20    End of Session Equipment Utilized During Treatment: Gait belt Activity Tolerance: Patient tolerated treatment well Patient left: in bed;with call bell/phone within reach;with family/visitor present Nurse Communication: Mobility status PT Visit Diagnosis: Unsteadiness on feet (R26.81);Other abnormalities of gait and mobility (R26.89);Muscle weakness (generalized) (M62.81)     Time: 1439-1500 PT Time Calculation (min) (ACUTE ONLY): 21 min  Charges:  $Gait Training: 8-22 mins                     Monea Pesantez B. Beverely Risen PT, DPT Acute Rehabilitation Services Pager [phone] Office [phone]    Elon Alas Fleet 11/16/2020, 3:35 PM

## 2020-11-16 NOTE — Progress Notes (Signed)
  Speech Language Pathology Treatment: Dysphagia  Patient Details Name: Terrence Henry MRN: [id number] DOB: 03-22-1981 Today's Date: 11/16/2020 Time: 9702-6378 SLP Time Calculation (min) (ACUTE ONLY): 11 min  Assessment / Plan / Recommendation Clinical Impression  F/u for swallowing after diet was advanced to regulars with thin liquids.  Today, he was taxed with dual consistencies of regular solids and thins simultaneously, as well as serial swallows of thin liquids.  There were no s/s of aspiration.  Voice was clear.  His mom was present at bedside; confirmed he has been doing quite well with eating/drinking.  No further dysphagia.  Recommend discontinuing cortrak.  SLP will sign off.    HPI HPI: Pt is a 39 year old male with history of polysubstance abuse who presented after he was found down in the bathroom with needles around, he was given Narcan by family before EMS arrived. ROSC achieved after 20 minutes of CPR in the ED. CT head was negative. ETT 11/21-11/27 for a low GCS of 3. Abdominal x-ray 12/1 questioned a degree of underlying ileus. Cortrak placed 12/1. MBS 12/3: pharyngeal dysphagia characterized by reduced lingual retraction, reduced anterior laryngeal movement, and a pharyngeal delay. Penetration and sensed aspiration noted with thin liquids.       SLP Plan  All goals met       Recommendations  Diet recommendations: Regular;Thin liquid Liquids provided via: Cup;Straw Medication Administration: Whole meds with puree Supervision: Patient able to self feed Postural Changes and/or Swallow Maneuvers: Seated upright 90 degrees                Oral Care Recommendations: Oral care BID Follow up Recommendations: None SLP Visit Diagnosis: Dysphagia, pharyngeal phase (R13.13) Plan: All goals met       GO                Terrence Henry 11/16/2020, 1:30 PM   L. Tivis Ringer, [place] Office number [phone] Pager  [phone]

## 2020-11-17 LAB — GLUCOSE, CAPILLARY
Glucose-Capillary: 96 mg/dL (ref 70–99)
Glucose-Capillary: 96 mg/dL (ref 70–99)
Glucose-Capillary: 118 mg/dL — ABNORMAL HIGH (ref 70–99)
Glucose-Capillary: 117 mg/dL — ABNORMAL HIGH (ref 70–99)
Glucose-Capillary: 107 mg/dL — ABNORMAL HIGH (ref 70–99)

## 2020-11-17 MED ORDER — QUETIAPINE FUMARATE 100 MG PO TABS
100.0000 mg | ORAL_TABLET | Freq: Every day | ORAL | Status: DC
  Administered 2020-11-17: 100 mg via ORAL
  Filled 2020-11-17: qty 1

## 2020-11-17 MED ORDER — DOCUSATE SODIUM 100 MG PO CAPS: 100.0000 mg | ORAL_CAPSULE | Freq: Every day | ORAL | Status: DC | PRN

## 2020-11-17 MED ORDER — THIAMINE HCL 100 MG/ML IJ SOLN: 100.0000 mg | Freq: Every day | INTRAMUSCULAR | Status: DC

## 2020-11-17 MED ORDER — FAMOTIDINE 20 MG PO TABS
20.0000 mg | ORAL_TABLET | Freq: Every day | ORAL | Status: DC
  Administered 2020-11-17 – 2020-11-18 (×2): 20 mg via ORAL
  Filled 2020-11-17 (×2): qty 1

## 2020-11-17 MED ORDER — ADULT MULTIVITAMIN W/MINERALS CH
1.0000 | ORAL_TABLET | Freq: Every day | ORAL | Status: DC
  Administered 2020-11-18: 1 via ORAL
  Filled 2020-11-17: qty 1

## 2020-11-17 MED ORDER — ALUM & MAG HYDROXIDE-SIMETH 200-200-20 MG/5ML PO SUSP: 15.0000 mL | Freq: Four times a day (QID) | ORAL | Status: DC | PRN

## 2020-11-17 MED ORDER — ACETAMINOPHEN 325 MG PO TABS
650.0000 mg | ORAL_TABLET | Freq: Four times a day (QID) | ORAL | Status: DC | PRN
  Administered 2020-11-17: 650 mg via ORAL
  Filled 2020-11-17: qty 2

## 2020-11-17 MED ORDER — LOPERAMIDE HCL 2 MG PO CAPS: 2.0000 mg | ORAL_CAPSULE | Freq: Four times a day (QID) | ORAL | Status: DC | PRN

## 2020-11-17 MED ORDER — FOLIC ACID 1 MG PO TABS
1.0000 mg | ORAL_TABLET | Freq: Every day | ORAL | Status: DC
  Administered 2020-11-18: 1 mg via ORAL
  Filled 2020-11-17: qty 1

## 2020-11-17 MED ORDER — POLYETHYLENE GLYCOL 3350 17 G PO PACK: 17.0000 g | PACK | Freq: Every day | ORAL | Status: DC | PRN

## 2020-11-17 MED ORDER — CLONAZEPAM 0.5 MG PO TABS
1.0000 mg | ORAL_TABLET | Freq: Every day | ORAL | Status: DC
  Administered 2020-11-17: 1 mg via ORAL
  Filled 2020-11-17: qty 2

## 2020-11-17 MED ORDER — THIAMINE HCL 100 MG PO TABS
100.0000 mg | ORAL_TABLET | Freq: Every day | ORAL | Status: DC
  Administered 2020-11-18: 100 mg via ORAL
  Filled 2020-11-17: qty 1

## 2020-11-17 MED ORDER — POTASSIUM CHLORIDE CRYS ER 20 MEQ PO TBCR
20.0000 meq | EXTENDED_RELEASE_TABLET | Freq: Every day | ORAL | Status: DC
  Administered 2020-11-17 – 2020-11-18 (×2): 20 meq via ORAL
  Filled 2020-11-17 (×2): qty 1

## 2020-11-17 NOTE — Progress Notes (Addendum)
Occupational Therapy Treatment Patient Details Name: Terrence Henry MRN: [id number] DOB: 01-31-1981 Today's Date: 11/17/2020    History of present illness 39 year old male with polysubstance abuse who presented in the emergency department cardiac arrest.   OT comments  Patient progressing with all goal areas.  He is able to walk around his room with supervision and verbal cueing for safety.  Patient continues to demonstrate scissoring with feet with mobility, and remains a higher fall risk.  Patient reaching for objects in his environment to help stabilize himself.  He was able to utilize the bathroom in his room with supervision, preform stand grooming with supervision, but again cannot accept challenges to balance, and reaches for objects in his environment.  Decreased safety and a thorough understanding of his deficits are barriers in the acute setting.  He is requesting a shower, OT will inquire if this is a possibility.  Patient left up in the recliner, eating lunch and his father in to visit.    Follow Up Recommendations  Supervision/Assistance - 24 hour;CIR    Equipment Recommendations  3 in 1 bedside commode;Tub/shower bench;Wheelchair (measurements OT);Wheelchair cushion (measurements OT);Hospital bed    Recommendations for Other Services      Precautions / Restrictions Precautions Precautions: Fall Restrictions Weight Bearing Restrictions: No                                      Standing balance support: No upper extremity supported Standing balance-Leahy Scale: Fair                             ADL either performed or assessed with clinical judgement   ADL   Eating/Feeding: Independent;Sitting   Grooming: Wash/dry hands;Wash/dry face;Total assistance;Supervision/safety;Standing                   Toilet Transfer: Supervision/safety;Ambulation;Regular Toilet   Toileting- Architect and Hygiene: Supervision/safety;Sit  to/from stand       Functional mobility during ADLs: Supervision/safety                         Cognition Arousal/Alertness: Awake/alert Behavior During Therapy: WFL for tasks assessed/performed Overall Cognitive Status: Impaired/Different from baseline Area of Impairment: Safety/judgement                         Safety/Judgement: Decreased awareness of safety;Decreased awareness of deficits              Exercises     Shoulder Instructions       General Comments      Pertinent Vitals/ Pain       Pain Assessment: No/denies pain Pain Intervention(s): Monitored during session                                                          Frequency  Min 2X/week        Progress Toward Goals  OT Goals(current goals can now be found in the care plan section)  Progress towards OT goals: Progressing toward goals  Acute Rehab OT Goals Patient Stated Goal: Would like to get out of the hospital OT Goal Formulation:  With patient Time For Goal Achievement: 11/25/20 Potential to Achieve Goals: Good  Plan Discharge plan remains appropriate    Co-evaluation                 AM-PAC OT "6 Clicks" Daily Activity     Outcome Measure   Help from another person eating meals?: None Help from another person taking care of personal grooming?: A Little Help from another person toileting, which includes using toliet, bedpan, or urinal?: A Little Help from another person bathing (including washing, rinsing, drying)?: A Little Help from another person to put on and taking off regular upper body clothing?: A Little Help from another person to put on and taking off regular lower body clothing?: A Little 6 Click Score: 19    End of Session    OT Visit Diagnosis: Unsteadiness on feet (R26.81);Muscle weakness (generalized) (M62.81);Other symptoms and signs involving cognitive function;Feeding difficulties (R63.3)   Activity Tolerance  Patient tolerated treatment well   Patient Left in chair;with call bell/phone within reach;with family/visitor present   Nurse Communication          Time: 0454-0981 OT Time Calculation (min): 16 min  Charges: OT General Charges $OT Visit: 1 Visit OT Treatments $Self Care/Home Management : 8-22 mins  11/17/2020  Rich, OTR/L  Acute Rehabilitation Services  Office:  [phone]    Suzanna Obey 11/17/2020, 1:23 PM

## 2020-11-17 NOTE — Progress Notes (Signed)
PROGRESS NOTE  Terrence Henry  DOB: 1981/01/12  PCP: Patient, No Pcp Per DJM:[id number]  DOA: 10/31/2020  LOS: 17 days   Chief complaint: Cardiac arrest  Brief narrative: Terrence Henry is a 39 y.o. male with PMH significant for polysubstance abuse On 10/31/2020, patient was brought to the ED as a cardiac arrest.  He was found unconscious by family in the bathroom with needles around.  Unknown downtime.  Family given Narcan without success before EMS arrived.  EMS achieved ROSC after 20 minutes of CPR, epinephrine x5 and defibrillation. Brought in to the ED with a King airway, in normal sinus rhythm and blood pressure low in 60s.  In the ED, he was intubated for a low GCS of 3, started on IV fluids and Levophed. Per cardiology, primary cause of cardiac arrest was not cardiac in origin and rather likely to be an overdose, hence patient did not have an emergent cardiac cath.  Echocardiogram showed EF of 40 to 45% with global hypokinesis and grade 1 diastolic dysfunction. Admitted to ICU, on mechanical ventilation, therapeutic hypothermia protocol. Extubated on 11/27. Transferred to hospitalist service on 12/2. Patient has significantly improved in physical and mental status in last several days. Pending CIR at this time.  Subjective: Patient was seen and examined this morning.  Lying on bed.  Not in distress.  Mental status intact.  Feels better after the core track tube was pulled out yesterday.  No longer having diarrhea.  Assessment/Plan: Cardiac arrest   Cardiogenic shock postarrest Acute hypoxic hypercapnic respiratory failure -Brought in for cardiac arrest likely due to heroin overdose.  Successfully resuscitated by EMS prior to arrival.   -Initially admitted to ICU, on mechanical ventilation, therapeutic hypothermia protocol. -Required IV fluid and pressors. Hemodynamics improved. -Currently normal sinus rhythm, stable blood pressure and breathing in room air.  Hypoxic  ischemic encephalopathy Intermittent delirium  -Secondary to unknown downtime during cardiac arrest -Mental status has gradually improved.  Much better today.  Off restraints for last 24 hours.  Currently on Seroquel 100 mg nightly.  Can taper down gradually.    Diarrhea Non-anion gap metabolic acidosis -GI pathogen panel negative.  Suspected to be related to tube feeding.  Diarrhea stopped after tube feeding was stopped.  Continue to monitor as needed antidiarrheals.  Acute kidney injury -Creatinine peaked at 2.53 secondary to shock.  Creatinine improved back to normal.  Continue to monitor  Physical deconditioning -Pending CIR  Polysubstance abuse -Counseled to quit  Mobility: PT follow-up needed Code Status:   Code Status: Full Code  Nutritional status: Body mass index is 25 kg/m. Nutrition Problem: Increased nutrient needs Etiology: acute illness Signs/Symptoms: estimated needs Diet Order            Diet regular Room service appropriate? No; Fluid consistency: Thin  Diet effective now                DVT prophylaxis: enoxaparin (LOVENOX) injection 40 mg Start: 10/31/20 1630   Antimicrobials:  None Fluid: None Consultants: Critical care signed off Family Communication:  Discussed with patient's dad yesterday.  Status is: Inpatient  Remains inpatient appropriate because: Pending CIR.  Dispo: The patient is from: Home              Anticipated d/c is to: CIR              Anticipated d/c date is: When bed available              Patient currently  is medically stable to d/c.   Infusions:  . sodium chloride Stopped (11/06/20 1740)    Scheduled Meds: . chlorhexidine  15 mL Mouth Rinse BID  . Chlorhexidine Gluconate Cloth  6 each Topical Daily  . clonazePAM  1 mg Oral QHS  . enoxaparin (LOVENOX) injection  40 mg Subcutaneous Q24H  . feeding supplement  237 mL Oral TID BM  . [START ON 11/18/2020] folic acid  1 mg Oral Daily  . mouth rinse  15 mL Mouth Rinse  q12n4p  . [START ON 11/18/2020] multivitamin with minerals  1 tablet Oral Daily  . potassium chloride  20 mEq Oral Daily  . QUEtiapine  100 mg Oral QHS  . sodium bicarbonate  650 mg Per Tube TID  . [START ON 11/18/2020] thiamine  100 mg Oral Daily   Or  . [START ON 11/18/2020] thiamine  100 mg Intravenous Daily    Antimicrobials: Anti-infectives (From admission, onward)   Start     Dose/Rate Route Frequency Ordered Stop   11/09/20 0800  vancomycin (VANCOCIN) IVPB 1000 mg/200 mL premix  Status:  Discontinued        1,000 mg 200 mL/hr over 60 Minutes Intravenous Every 8 hours 11/09/20 0051 11/11/20 1656   11/09/20 0800  piperacillin-tazobactam (ZOSYN) IVPB 3.375 g        3.375 g 12.5 mL/hr over 240 Minutes Intravenous Every 8 hours 11/09/20 0051 11/13/20 2029   11/09/20 0145  vancomycin (VANCOREADY) IVPB 2000 mg/400 mL        2,000 mg 200 mL/hr over 120 Minutes Intravenous  Once 11/09/20 0051 11/09/20 0343   11/09/20 0145  piperacillin-tazobactam (ZOSYN) IVPB 3.375 g        3.375 g 100 mL/hr over 30 Minutes Intravenous  Once 11/09/20 0051 11/09/20 0221      PRN meds: acetaminophen, docusate sodium, Gerhardt's butt cream, loperamide, ondansetron (ZOFRAN) IV, polyethylene glycol, Resource ThickenUp Clear, sodium chloride flush   Objective: Vitals:   11/17/20 0416 11/17/20 0733  BP: 114/72 137/80  Pulse: 88 96  Resp: 19 16  Temp: 98.4 F (36.9 C) 97.9 F (36.6 C)  SpO2: 100% 100%    Intake/Output Summary (Last 24 hours) at 11/17/2020 1000 Last data filed at 11/17/2020 0734 Gross per 24 hour  Intake 260 ml  Output --  Net 260 ml   Filed Weights   11/15/20 0425 11/16/20 0410 11/17/20 0416  Weight: 93.8 kg 93 kg 90.7 kg   Weight change: -2.28 kg Body mass index is 25 kg/m.   Physical Exam: General exam: Young Caucasian male.  Lying on bed.  Not in physical distress Skin: No rashes, lesions or ulcers. HEENT: Atraumatic, normocephalic, no obvious bleeding.  Has Dobbhoff  in place Lungs: Clear to auscultation bilaterally CVS: Regular rate and rhythm, no murmur GI/Abd soft, nontender, nondistended, bowel sound present CNS: Alert, awake, oriented x3  psychiatry: Mood appropriate Extremities: No pedal edema, no calf tenderness  Data Review: I have personally reviewed the laboratory data and studies available.  Recent Labs  Lab 11/11/20 0831  WBC 9.6  NEUTROABS 5.3  HGB 12.6*  HCT 38.5*  MCV 91.2  PLT 224   Recent Labs  Lab 11/11/20 0831 11/12/20 0206 11/13/20 0142 11/14/20 0454  NA 142 141 141 139  K 3.7 4.0 4.0 4.1  CL 113* 111 109 106  CO2 21* 21* 20* 21*  GLUCOSE 131* 103* 113* 127*  BUN 14 14 16 12   CREATININE 0.65 0.60* 0.60* 0.59*  CALCIUM 8.2*  8.6* 8.6* 9.0  MG 1.8 2.1  --   --   PHOS 2.9  --   --  3.0    F/u labs ordered.  Signed, Lorin Glass, MD [facility] 11/17/2020

## 2020-11-17 NOTE — Progress Notes (Addendum)
Inpatient Rehabilitation Admissions Coordinator  Noted continued progress with therapy. I spoke with Dad by phone. I explained that he is progressing well and will likely progress to d/c directly home. He referred me to call his wife at home .   I spoke with Mom by phone to explain that he is making great progress and that it was unlikely that I would have a Cir bed to admit to this week and that he likely could progress to direct d/c home if continues to make progress. Mom is trying to get him directly into a drug rehab program upon d/c from the hospital and would like to speak to TOK.  Ottie Glazier, RN, MSN Rehab Admissions Coordinator [phone] 11/17/2020 4:01 PM

## 2020-11-17 NOTE — Social Work (Addendum)
CSW consulted for Substance Abuse Counseling/Education-CSW met with the patient at bedside. CSW introduced self and explained role. CSW provided mental health(MH) and substance abuse(SA) resources. Patient states he has never been in a treatment program. CSW advised the importance of him taking the initiative to seek treatment. Patient states he understands.   CSW spoke with the patient's mother. CSW explained, when medically stable,patient will discharge home.  CSW advised patient was provided with MH/SA resources. She expressed she was concerned about him being discharged because " I don't want him sneaking out of the house at 5:00 in the morning". She became emotional and shared the past 6 years has been difficult (referring to patient and his addiction) and she has been stressed out. CSW acknowledged her concerns and provided a listening ear. CSW encouraged her to seek mental health counseling to help her process and cope with things she was dealing with. She expressed appreciation for CSW assistance. No further questions or concerns noted at this time.    Thurmond Butts, MSW, [place] Clinical Social Worker

## 2020-11-18 LAB — GLUCOSE, CAPILLARY
Glucose-Capillary: 88 mg/dL (ref 70–99)
Glucose-Capillary: 124 mg/dL — ABNORMAL HIGH (ref 70–99)

## 2020-11-18 MED ORDER — QUETIAPINE FUMARATE 25 MG PO TABS: ORAL_TABLET | ORAL | 0 refills | Status: DC

## 2020-11-18 NOTE — Progress Notes (Signed)
PROGRESS NOTE  Terrence Henry  DOB: 1981/03/03  PCP: Patient, No Pcp Per SWF:[id number]  DOA: 10/31/2020  LOS: 18 days   Chief complaint: Cardiac arrest  Brief narrative: Terrence Henry is a 39 y.o. male with PMH significant for polysubstance abuse On 10/31/2020, patient was brought to the ED as a cardiac arrest.  He was found unconscious by family in the bathroom with needles around.  Unknown downtime.  Family given Narcan without success before EMS arrived.  EMS achieved ROSC after 20 minutes of CPR, epinephrine x5 and defibrillation. Brought in to the ED with a King airway, in normal sinus rhythm and blood pressure low in 60s.  In the ED, he was intubated for a low GCS of 3, started on IV fluids and Levophed. Per cardiology, primary cause of cardiac arrest was not cardiac in origin and rather likely to be an overdose, hence patient did not have an emergent cardiac cath.  Echocardiogram showed EF of 40 to 45% with global hypokinesis and grade 1 diastolic dysfunction. Admitted to ICU, on mechanical ventilation, therapeutic hypothermia protocol. Extubated on 11/27. Transferred to hospitalist service on 12/2. Patient has significantly improved in physical and mental status in last several days. He qualified for CIR and was waiting for bed but at this time, he has progressed significantly and is strong enough to go home.  Subjective: Patient was seen and examined this morning.  Lying on bed.  Not in distress.  Mental status stable.  Physical strength  significantly better.  Assessment/Plan: Cardiac arrest   Cardiogenic shock postarrest Acute hypoxic hypercapnic respiratory failure -Brought in for cardiac arrest likely due to heroin overdose.  Successfully resuscitated by EMS prior to arrival.   -Initially admitted to ICU, on mechanical ventilation, therapeutic hypothermia protocol. -Required IV fluid and pressors. Hemodynamics improved. -Currently normal sinus rhythm, stable blood  pressure and breathing in room air.  Hypoxic ischemic encephalopathy Intermittent delirium  -Secondary to unknown downtime during cardiac arrest -Mental status has gradually improved.  Much better today.  Off restraints for last 24 hours.  Currently on Seroquel 100 mg nightly.  Can taper down gradually.    Diarrhea Non-anion gap metabolic acidosis -GI pathogen panel negative.  Suspected to be related to tube feeding.  Diarrhea stopped after tube feeding was stopped.  Continue to monitor as needed antidiarrheals.  Acute kidney injury -Creatinine peaked at 2.53 secondary to shock.  Creatinine improved back to normal.  Continue to monitor  Physical deconditioning -He qualified for CIR and was waiting for bed but at this time, he has progressed significantly and is strong enough to go home.  Polysubstance abuse -Counseled to quit  Mobility: PT follow-up needed Code Status:   Code Status: Full Code  Nutritional status: Body mass index is 24.94 kg/m. Nutrition Problem: Increased nutrient needs Etiology: acute illness Signs/Symptoms: estimated needs Diet Order            Diet regular Room service appropriate? No; Fluid consistency: Thin  Diet effective now                DVT prophylaxis: enoxaparin (LOVENOX) injection 40 mg Start: 10/31/20 1630   Antimicrobials:  None Fluid: None Consultants: Critical care signed off Family Communication:  Discussed with patient's family almost every day  Status is: Inpatient  Remains inpatient appropriate because: Pending CIR.  Dispo: The patient is from: Home              Anticipated d/c is to: CIR versus home with  home therapies              Anticipated d/c date is: When bed available, pending family's decision.              Patient currently is medically stable to d/c.   Infusions:  . sodium chloride Stopped (11/06/20 1607)    Scheduled Meds: . chlorhexidine  15 mL Mouth Rinse BID  . Chlorhexidine Gluconate Cloth  6 each  Topical Daily  . clonazePAM  1 mg Oral QHS  . enoxaparin (LOVENOX) injection  40 mg Subcutaneous Q24H  . famotidine  20 mg Oral Daily  . feeding supplement  237 mL Oral TID BM  . folic acid  1 mg Oral Daily  . mouth rinse  15 mL Mouth Rinse q12n4p  . multivitamin with minerals  1 tablet Oral Daily  . potassium chloride  20 mEq Oral Daily  . QUEtiapine  100 mg Oral QHS  . sodium bicarbonate  650 mg Per Tube TID  . thiamine  100 mg Oral Daily   Or  . thiamine  100 mg Intravenous Daily    Antimicrobials: Anti-infectives (From admission, onward)   Start     Dose/Rate Route Frequency Ordered Stop   11/09/20 0800  vancomycin (VANCOCIN) IVPB 1000 mg/200 mL premix  Status:  Discontinued        1,000 mg 200 mL/hr over 60 Minutes Intravenous Every 8 hours 11/09/20 0051 11/11/20 1656   11/09/20 0800  piperacillin-tazobactam (ZOSYN) IVPB 3.375 g        3.375 g 12.5 mL/hr over 240 Minutes Intravenous Every 8 hours 11/09/20 0051 11/13/20 2029   11/09/20 0145  vancomycin (VANCOREADY) IVPB 2000 mg/400 mL        2,000 mg 200 mL/hr over 120 Minutes Intravenous  Once 11/09/20 0051 11/09/20 0343   11/09/20 0145  piperacillin-tazobactam (ZOSYN) IVPB 3.375 g        3.375 g 100 mL/hr over 30 Minutes Intravenous  Once 11/09/20 0051 11/09/20 0221      PRN meds: acetaminophen, alum & mag hydroxide-simeth, docusate sodium, Gerhardt's butt cream, loperamide, ondansetron (ZOFRAN) IV, polyethylene glycol, Resource ThickenUp Clear, sodium chloride flush   Objective: Vitals:   11/18/20 0343 11/18/20 0849  BP: 106/65 132/81  Pulse: 86 99  Resp: 16 18  Temp: 98.4 F (36.9 C) 98.7 F (37.1 C)  SpO2: 100% 100%    Intake/Output Summary (Last 24 hours) at 11/18/2020 1045 Last data filed at 11/18/2020 0300 Gross per 24 hour  Intake 480 ml  Output --  Net 480 ml   Filed Weights   11/16/20 0410 11/17/20 0416 11/18/20 0508  Weight: 93 kg 90.7 kg 90.5 kg   Weight change: -0.219 kg Body mass index is  24.94 kg/m.   Physical Exam: General exam: Young Caucasian male.  Lying on bed.  Not in physical distress. Skin: No rashes, lesions or ulcers. HEENT: Atraumatic, normocephalic, no obvious bleeding.  Has Dobbhoff in place Lungs: Clear to auscultation bilaterally CVS: Regular rate and rhythm, no murmur GI/Abd soft, nontender, nondistended, bowel sound present CNS: Alert, awake, oriented x3  psychiatry: Mood appropriate Extremities: No pedal edema, no calf tenderness  Data Review: I have personally reviewed the laboratory data and studies available.  No results for input(s): WBC, NEUTROABS, HGB, HCT, MCV, PLT in the last 168 hours. Recent Labs  Lab 11/12/20 0206 11/13/20 0142 11/14/20 0454  NA 141 141 139  K 4.0 4.0 4.1  CL 111 109 106  CO2 21*  20* 21*  GLUCOSE 103* 113* 127*  BUN 14 16 12   CREATININE 0.60* 0.60* 0.59*  CALCIUM 8.6* 8.6* 9.0  MG 2.1  --   --   PHOS  --   --  3.0    F/u labs ordered.  Signed, , MD [facility] 11/18/2020

## 2020-11-18 NOTE — Discharge Summary (Signed)
Physician Discharge Summary  [name] Henry:[id number] DOB: 1981-04-26 DOA: 10/31/2020  PCP: Patient, No Pcp Per  Admit date: 10/31/2020 Discharge date: 11/18/2020  Admitted From: Home Discharge disposition: Home   Code Status: Full Code  Diet Recommendation: Cardiac diet  Discharge Diagnosis:   Active Problems:   Cardiac arrest (HCC)   Shock (HCC)   Encounter for central line placement   Type 2 MI (myocardial infarction) (HCC)  History of Present Illness / Brief narrative:  Terrence Henry is a 39 y.o. male with PMH significant for polysubstance abuse On 10/31/2020, patient was brought to the ED as a cardiac arrest.  He was found unconscious by family in the bathroom with needles around.  Unknown downtime.  Family given Narcan without success before EMS arrived.  EMS achieved ROSC after 20 minutes of CPR, epinephrine x5 and defibrillation. Brought in to the ED with a King airway, in normal sinus rhythm and blood pressure low in 60s.  In the ED, he was intubated for a low GCS of 3, started on IV fluids and Levophed. Per cardiology, primary cause of cardiac arrest was not cardiac in origin and rather likely to be an overdose, hence patient did not have an emergent cardiac cath.  Echocardiogram showed EF of 40 to 45% with global hypokinesis and grade 1 diastolic dysfunction. Admitted to ICU, on mechanical ventilation, therapeutic hypothermia protocol. Extubated on 11/27. Transferred to hospitalist service on 12/2. Patient has significantly improved in physical and mental status in last several days. He qualified for CIR and was waiting for bed but at this time, he has progressed significantly and is strong enough to go home.  Subjective:  Seen and examined this morning.  Lying on bed.  Not in distress.  Feels better.  Wants to go home.  Hospital Course:  Cardiac arrest   Cardiogenic shock postarrest Acute hypoxic hypercapnic respiratory failure -Brought in for cardiac  arrest likely due to heroin overdose. Successfully resuscitated by EMS prior to arrival.   -Initially admitted to ICU, on mechanical ventilation, therapeutic hypothermia protocol. -Required IV fluid and pressors. Hemodynamics improved. -Currently normal sinus rhythm, stable blood pressure and breathing in room air.  Hypoxic ischemic encephalopathy Intermittent delirium  -Secondary to unknown downtime during cardiac arrest -Mental status has gradually improved.  Much better today.  Off restraints for last 24 hours.  Currently on Seroquel 100 mg nightly.    Discharged with the same.  Continue to taper as an outpatient.     Diarrhea Non-anion gap metabolic acidosis -GI pathogen panel negative.  Suspected to be related to tube feeding.  Diarrhea stopped after tube feeding was stopped.   Acute kidney injury -Creatinine peaked at 2.53 secondary to shock.  Creatinine improved back to normal.  Continue to monitor  Physical deconditioning -He qualified for CIR and was waiting for bed but at this time, he has progressed significantly and is strong enough to go home.  Polysubstance abuse -Counseled to quit  Wound care: Wound / Incision (Open or Dehisced) 11/12/20 (MASD) Moisture Associated Skin Damage Right;Left;Mid redden (Active)  Date First Assessed/Time First Assessed: 11/12/20 1945   Wound Type: (MASD) Moisture Associated Skin Damage  Location Orientation: Right;Left;Mid  Wound Description (Comments): redden    Assessments 11/12/2020  7:45 PM 11/16/2020  9:00 PM  Dressing Type None None  Dressing Change Frequency PRN PRN  Site / Wound Assessment Clean;Dry;Red Clean;Dry;Red  % Wound base Red or Granulating 100% --  % Wound base Yellow/Fibrinous Exudate 0% --  %  Wound base Black/Eschar 0% --  % Wound base Other/Granulation Tissue (Comment) 0% --  Peri-wound Assessment Intact --  Margins Attached edges (approximated) --  Closure None --  Drainage Amount None --  Treatment  Cleansed;Other (Comment) --     No Linked orders to display    Discharge Exam:   Vitals:   11/17/20 2326 11/18/20 0343 11/18/20 0508 11/18/20 0849  BP: 106/64 106/65  132/81  Pulse: 86 86  99  Resp: 18 16  18   Temp: 98.4 F (36.9 C) 98.4 F (36.9 C)  98.7 F (37.1 C)  TempSrc: Oral Oral  Oral  SpO2: 97% 100%  100%  Weight:   90.5 kg   Height:        Body mass index is 24.94 kg/m.  General exam: Pleasant, not in distress Skin: No rashes, lesions or ulcers. HEENT: Atraumatic, normocephalic, no obvious bleeding Lungs: Clear to auscultation bilaterally CVS: Regular rate and rhythm, no murmur GI/Abd soft, nontender, nondistended, bowel sound present CNS: Alert, awake, oriented x3 Psychiatry: Mood appropriate Extremities: No pedal edema, no calf tenderness  Follow ups:   Discharge Instructions    Increase activity slowly   Complete by: As directed    No dressing needed   Complete by: As directed       Follow-up Information    [facility] Follow up.   Contact information: [address] [place] [phone]              Recommendations for Outpatient Follow-Up:   1. Follow-up with PCP as an outpatient.  Follow-up with [facility] 2. Need to repeat an echocardiogram in next 6 weeks.  Discharge Instructions:  Follow with Primary MD Patient, No Pcp Per in 7 days   Get CBC/BMP checked in next visit within 1 week by PCP or SNF MD ( we routinely change or add medications that can affect your baseline labs and fluid status, therefore we recommend that you get the mentioned basic workup next visit with your PCP, your PCP may decide not to get them or add new tests based on their clinical decision)  On your next visit with your PCP, please Get Medicines reviewed and adjusted.  Please request your PCP  to go over all Hospital Tests and Procedure/Radiological results at the follow up,  please get all Hospital records sent to your Prim MD by signing hospital release before you go home.  Activity: As tolerated with Full fall precautions use walker/cane & assistance as needed  For Heart failure patients - Check your Weight same time everyday, if you gain over 2 pounds, or you develop in leg swelling, experience more shortness of breath or chest pain, call your Primary MD immediately. Follow Cardiac Low Salt Diet and 1.5 lit/day fluid restriction.  If you have smoked or chewed Tobacco in the last 2 yrs please stop smoking, stop any regular Alcohol  and or any Recreational drug use.  If you experience worsening of your admission symptoms, develop shortness of breath, life threatening emergency, suicidal or homicidal thoughts you must seek medical attention immediately by calling 911 or calling your MD immediately  if symptoms less severe.  You Must read complete instructions/literature along with all the possible adverse reactions/side effects for all the Medicines you take and that have been prescribed to you. Take any new Medicines after you have completely understood and accpet all the possible adverse reactions/side effects.   Do not drive,  operate heavy machinery, perform activities at heights, swimming or participation in water activities or provide baby sitting services if your were admitted for syncope or siezures until you have seen by Primary MD or a Neurologist and advised to do so again.  Do not drive when taking Pain medications.  Do not take more than prescribed Pain, Sleep and Anxiety Medications  Wear Seat belts while driving.   Please note You were cared for by a hospitalist during your hospital stay. If you have any questions about your discharge medications or the care you received while you were in the hospital after you are discharged, you can call the unit and asked to speak with the hospitalist on call if the hospitalist that took care of you is not available.  Once you are discharged, your primary care physician will handle any further medical issues. Please note that NO REFILLS for any discharge medications will be authorized once you are discharged, as it is imperative that you return to your primary care physician (or establish a relationship with a primary care physician if you do not have one) for your aftercare needs so that they can reassess your need for medications and monitor your lab values.    Allergies as of 11/18/2020   No Known Allergies     Medication List    STOP taking these medications   loratadine 10 MG tablet Commonly known as: CLARITIN     TAKE these medications   QUEtiapine 25 MG tablet Commonly known as: SEROQUEL Take 4 tablets at bedtime for 7 days, 3 tabs at bedtime for 7 days, 2 tablets at bedtime for 7 days then 1 tablet/day for 7 days then stop            Discharge Care Instructions  (From admission, onward)         Start     Ordered   11/18/20 0000  No dressing needed        11/18/20 1332          Time coordinating discharge: 35 minutes  The results of significant diagnostics from this hospitalization (including imaging, microbiology, ancillary and laboratory) are listed below for reference.    Procedures and Diagnostic Studies:   CT Head Wo Contrast  Result Date: 10/31/2020 CLINICAL DATA:  Found unresponsive. EXAM: CT HEAD WITHOUT CONTRAST TECHNIQUE: Contiguous axial images were obtained from the base of the skull through the vertex without intravenous contrast. COMPARISON:  09/06/2008 FINDINGS: Brain: No acute intracranial abnormality. Specifically, no hemorrhage, hydrocephalus, mass lesion, acute infarction, or significant intracranial injury. Vascular: No hyperdense vessel or unexpected calcification. Skull: No acute calvarial abnormality. Sinuses/Orbits: Mucosal thickening in the ethmoid air cells. No air-fluid levels. Other: None IMPRESSION: No intracranial abnormality. Electronically Signed    By: Charlett Nose M.D.   On: 10/31/2020 17:03   DG Chest Port 1 View  Result Date: 11/01/2020 CLINICAL DATA:  Status post central line placement. EXAM: PORTABLE CHEST 1 VIEW COMPARISON:  October 31, 2020. FINDINGS: Stable cardiomediastinal silhouette. Endotracheal nasogastric tubes are unchanged in position. Interval placement of left internal jugular catheter with distal tip in expected position of SVC. No pneumothorax is noted. Right lung is clear. Mild left basilar subsegmental atelectasis is noted. Bony thorax is unremarkable. IMPRESSION: Interval placement of left internal jugular catheter with distal tip in expected position of SVC. No pneumothorax is noted. Mild left basilar subsegmental atelectasis is noted. Electronically Signed   By: Lupita Raider M.D.   On: 11/01/2020 09:39  DG Chest Portable 1 View  Result Date: 10/31/2020 CLINICAL DATA:  Post intubation EXAM: PORTABLE CHEST 1 VIEW COMPARISON:  02/26/2019 FINDINGS: Endotracheal tube tip about 2.5 cm superior to carina. Esophageal tube tip overlies the proximal stomach. No focal opacity or pleural effusion. Stable cardiomediastinal silhouette. No pneumothorax. IMPRESSION: Endotracheal tube tip about 2.5 cm superior to carina. Esophageal tube tip overlies the proximal stomach. Grossly clear lung fields. Electronically Signed   By: Jasmine Pang M.D.   On: 10/31/2020 15:53   EEG adult  Result Date: 11/01/2020 Charlsie Quest, MD     11/01/2020  1:48 PM Patient Name: Terrence Henry MRN: [id number] Epilepsy Attending: Charlsie Quest Referring Physician/Provider: Quintella Baton, NP Date: 11/01/2020 Duration: 23.45 minutes Patient history: 39 year old male status post cardiac arrest.  EEG to evaluate for seizures. Level of alertness: Comatose AEDs during EEG study: Versed Technical aspects: This EEG study was done with scalp electrodes positioned according to the 10-20 International system of electrode placement. Electrical activity  was acquired at a sampling rate of  and reviewed with a high frequency filter of  and a low frequency filter of . EEG data were recorded continuously and digitally stored. Description:   EEG showed continuous generalized predominantly 9-10 Hz generalized alpha activity. EEG was not reactive to noxious stimulation.  Hyperventilation and photic stimulation were not performed.   ABNORMALITY -Alpha coma IMPRESSION: This study is suggestive of profound diffuse encephalopathy, nonspecific etiology but likely related to sedation, anoxic/hypoxic brain injury. No seizures or epileptiform discharges were seen throughout the recording. Charlsie Quest   ECHOCARDIOGRAM COMPLETE  Result Date: 10/31/2020    ECHOCARDIOGRAM REPORT   Patient Name:   Terrence Henry Date of Exam: 10/31/2020 Medical Rec #:  [id number]       Height:       75.0 in Accession #:    [id number]      Weight:       220.5 lb Date of Birth:  [birth year]       BSA:          2.287 m Patient Age:    39 years        BP:           142/114 mmHg Patient Gender: M               HR:           69 bpm. Exam Location:  Inpatient Procedure: 2D Echo STAT ECHO Indications:    cardiac arrest  History:        Patient has no prior history of Echocardiogram examinations.                 Risk Factors:substance abuse.  Sonographer:    Delcie Roch Referring Phys: [id number] PAULA B SIMPSON IMPRESSIONS  1. Left ventricular ejection fraction, by estimation, is 40 to 45%. The left ventricle has mildly decreased function. The left ventricle demonstrates global hypokinesis. There is mild left ventricular hypertrophy. Left ventricular diastolic parameters are consistent with Grade I diastolic dysfunction (impaired relaxation).  2. Right ventricular systolic function is normal. The right ventricular size is normal.  3. The mitral valve is normal in structure. No evidence of mitral valve regurgitation. No evidence of mitral stenosis.  4. The aortic valve was not well  visualized. Aortic valve regurgitation is not visualized. No aortic stenosis is present.  5. The inferior vena cava is normal in size with greater than 50% respiratory variability, suggesting right atrial pressure  of 3 mmHg. FINDINGS  Left Ventricle: Left ventricular ejection fraction, by estimation, is 40 to 45%. The left ventricle has mildly decreased function. The left ventricle demonstrates global hypokinesis. The left ventricular internal cavity size was normal in size. There is  mild left ventricular hypertrophy. Abnormal (paradoxical) septal motion, consistent with left bundle branch block. Left ventricular diastolic parameters are consistent with Grade I diastolic dysfunction (impaired relaxation). Right Ventricle: The right ventricular size is normal. No increase in right ventricular wall thickness. Right ventricular systolic function is normal. Left Atrium: Left atrial size was normal in size. Right Atrium: Right atrial size was normal in size. Pericardium: There is no evidence of pericardial effusion. Mitral Valve: The mitral valve is normal in structure. No evidence of mitral valve regurgitation. No evidence of mitral valve stenosis. Tricuspid Valve: The tricuspid valve is normal in structure. Tricuspid valve regurgitation is not demonstrated. No evidence of tricuspid stenosis. Aortic Valve: The aortic valve was not well visualized. Aortic valve regurgitation is not visualized. No aortic stenosis is present. Pulmonic Valve: The pulmonic valve was normal in structure. Pulmonic valve regurgitation is not visualized. No evidence of pulmonic stenosis. Aorta: The aortic root is normal in size and structure. Venous: The inferior vena cava is normal in size with greater than 50% respiratory variability, suggesting right atrial pressure of 3 mmHg. IAS/Shunts: No atrial level shunt detected by color flow Doppler.  LEFT VENTRICLE PLAX 2D LVIDd:         4.10 cm     Diastology LVIDs:         3.10 cm     LV e'  medial:    6.64 cm/s LV PW:         1.40 cm     LV E/e' medial:  5.9 LV IVS:        1.10 cm     LV e' lateral:   10.00 cm/s LVOT diam:     2.00 cm     LV E/e' lateral: 3.9 LV SV:         31 LV SV Index:   14 LVOT Area:     3.14 cm  LV Volumes (MOD) LV vol d, MOD A2C: 46.7 ml LV vol d, MOD A4C: 62.1 ml LV vol s, MOD A2C: 30.7 ml LV vol s, MOD A4C: 42.2 ml LV SV MOD A2C:     16.0 ml LV SV MOD A4C:     62.1 ml LV SV MOD BP:      18.2 ml RIGHT VENTRICLE             IVC RV S prime:     12.60 cm/s  IVC diam: 1.70 cm TAPSE (M-mode): 1.3 cm LEFT ATRIUM           Index       RIGHT ATRIUM          Index LA diam:      3.30 cm 1.44 cm/m  RA Area:     9.91 cm LA Vol (A4C): 27.7 ml 12.11 ml/m RA Volume:   18.70 ml 8.18 ml/m  AORTIC VALVE LVOT Vmax:   71.10 cm/s LVOT Vmean:  45.700 cm/s LVOT VTI:    0.099 m  AORTA Ao Root diam: 3.20 cm Ao Asc diam:  3.20 cm MITRAL VALVE MV Area (PHT): 3.03 cm    SHUNTS MV Decel Time: 250 msec    Systemic VTI:  0.10 m MV E velocity: 39.00 cm/s  Systemic Diam: 2.00 cm MV  A velocity: 41.60 cm/s MV E/A ratio:  0.94 Donato Schultz MD Electronically signed by Donato Schultz MD Signature Date/Time: 10/31/2020/6:02:17 PM    Final      Labs:   Basic Metabolic Panel: Recent Labs  Lab 11/12/20 0206 11/13/20 0142 11/14/20 0454  NA 141 141 139  K 4.0 4.0 4.1  CL 111 109 106  CO2 21* 20* 21*  GLUCOSE 103* 113* 127*  BUN 14 16 12   CREATININE 0.60* 0.60* 0.59*  CALCIUM 8.6* 8.6* 9.0  MG 2.1  --   --   PHOS  --   --  3.0   GFR Estimated Creatinine Clearance: 149.6 mL/min (A) (by C-G formula based on SCr of 0.59 mg/dL (L)). Liver Function Tests: No results for input(s): AST, ALT, ALKPHOS, BILITOT, PROT, ALBUMIN in the last 168 hours. No results for input(s): LIPASE, AMYLASE in the last 168 hours. No results for input(s): AMMONIA in the last 168 hours. Coagulation profile No results for input(s): INR, PROTIME in the last 168 hours.  CBC: No results for input(s): WBC, NEUTROABS, HGB,  HCT, MCV, PLT in the last 168 hours. Cardiac Enzymes: No results for input(s): CKTOTAL, CKMB, CKMBINDEX, TROPONINI in the last 168 hours. BNP: Invalid input(s): POCBNP CBG: Recent Labs  Lab 11/17/20 0807 11/17/20 1627 11/17/20 2018 11/18/20 0607 11/18/20 1157  GLUCAP 118* 117* 107* 88 124*   D-Dimer No results for input(s): DDIMER in the last 72 hours. Hgb A1c No results for input(s): HGBA1C in the last 72 hours. Lipid Profile No results for input(s): CHOL, HDL, LDLCALC, TRIG, CHOLHDL, LDLDIRECT in the last 72 hours. Thyroid function studies No results for input(s): TSH, T4TOTAL, T3FREE, THYROIDAB in the last 72 hours.  Invalid input(s): FREET3 Anemia work up No results for input(s): VITAMINB12, FOLATE, FERRITIN, TIBC, IRON, RETICCTPCT in the last 72 hours. Microbiology Recent Results (from the past 240 hour(s))  Culture, blood (routine x 2)     Status: None   Collection Time: 11/08/20  6:15 PM   Specimen: BLOOD  Result Value Ref Range Status   Specimen Description BLOOD LEFT ANTECUBITAL  Final   Special Requests   Final    BOTTLES DRAWN AEROBIC ONLY Blood Culture adequate volume   Culture   Final    NO GROWTH 5 DAYS Performed at [facility], [address] [place], [place] [address]    Report Status 11/13/2020 FINAL  Final  Culture, blood (routine x 2)     Status: None   Collection Time: 11/08/20  6:33 PM   Specimen: BLOOD  Result Value Ref Range Status   Specimen Description BLOOD LEFT ANTECUBITAL  Final   Special Requests   Final    BOTTLES DRAWN AEROBIC ONLY Blood Culture adequate volume   Culture   Final    NO GROWTH 5 DAYS Performed at [facility], [address] [place], [place] [address]    Report Status 11/13/2020 FINAL  Final  Gastrointestinal Panel by PCR , Stool     Status: None   Collection Time: 11/11/20  7:30 PM   Specimen: Stool  Result Value Ref Range Status   Campylobacter species NOT DETECTED NOT DETECTED Final   Plesimonas  shigelloides NOT DETECTED NOT DETECTED Final   Salmonella species NOT DETECTED NOT DETECTED Final   Yersinia enterocolitica NOT DETECTED NOT DETECTED Final   Vibrio species NOT DETECTED NOT DETECTED Final   Vibrio cholerae NOT DETECTED NOT DETECTED Final   Enteroaggregative E coli (EAEC) NOT  DETECTED NOT DETECTED Final   Enteropathogenic E coli (EPEC) NOT DETECTED NOT DETECTED Final   Enterotoxigenic E coli (ETEC) NOT DETECTED NOT DETECTED Final   Shiga like toxin producing E coli (STEC) NOT DETECTED NOT DETECTED Final   Shigella/Enteroinvasive E coli (EIEC) NOT DETECTED NOT DETECTED Final   Cryptosporidium NOT DETECTED NOT DETECTED Final   Cyclospora cayetanensis NOT DETECTED NOT DETECTED Final   Entamoeba histolytica NOT DETECTED NOT DETECTED Final   Giardia lamblia NOT DETECTED NOT DETECTED Final   Adenovirus F40/41 NOT DETECTED NOT DETECTED Final   Astrovirus NOT DETECTED NOT DETECTED Final   Norovirus GI/GII NOT DETECTED NOT DETECTED Final   Rotavirus A NOT DETECTED NOT DETECTED Final   Sapovirus (I, II, IV, and V) NOT DETECTED NOT DETECTED Final    Comment: Performed at [facility], [address] [place], [place] [address]     Signed: Lorin GlassBinaya Jeric Slagel  [facility] 11/18/2020, 1:32 PM

## 2020-11-18 NOTE — Progress Notes (Signed)
Physical Therapy Treatment Patient Details Name: Terrence Henry MRN: [id number] DOB: [birth year] Today's Date: 11/18/2020    History of Present Illness 39 year old male with polysubstance abuse who presented in the emergency department cardiac arrest.    PT Comments    Pt supine in bed but eager to participate.  Progressed to stair training this session.   Pt continues to present with balance deficits and remains to benefit from rehab in a post acute setting to improve strength and function before returning home.      Follow Up Recommendations  CIR (Pt remains unsure with disposition decision if he chooses home with will need HHPT follow up.)     Equipment Recommendations  Rolling walker with 5" wheels;3in1 (PT)    Recommendations for Other Services       Precautions / Restrictions Precautions Precautions: Fall Restrictions Weight Bearing Restrictions: No    Mobility  Bed Mobility Overal bed mobility: Needs Assistance Bed Mobility: Supine to Sit;Sit to Supine     Supine to sit: Supervision Sit to supine: Supervision   General bed mobility comments: Supervision for safety this session.  Pt required cues for sequencing and safety.  Transfers Overall transfer level: Needs assistance Equipment used: None Transfers: Sit to/from Stand Sit to Stand: Supervision         General transfer comment: Pt impulsive to stand with out RW but eager to walk toward RW for use/  Ambulation/Gait Ambulation/Gait assistance: Min guard;Min assist Gait Distance (Feet): 450 Feet Assistive device: Rolling walker (2 wheeled) Gait Pattern/deviations: Step-through pattern;Decreased stride length;Ataxic;Staggering left;Staggering right;Narrow base of support Gait velocity: decreased   General Gait Details: Mild scissoring remains.  Cues for safety with turns and negotiating obstacles.  Pt pushing RW too far out in front at times.  Mild staggering noted but able to correct with min guard -  min A and RW for UE support.   Stairs Stairs: Yes Stairs assistance: Min guard Stair Management: One rail Right Number of Stairs: 10 General stair comments: Cues for sequencing and safety/  Reliance on rail for balance. Cues to slow pace for safety as he remains impulsive.   Wheelchair Mobility    Modified Rankin (Stroke Patients Only)       Balance Overall balance assessment: Needs assistance Sitting-balance support: Bilateral upper extremity supported;Feet supported Sitting balance-Leahy Scale: Good       Standing balance-Leahy Scale: Poor                              Cognition Arousal/Alertness: Awake/alert Behavior During Therapy: WFL for tasks assessed/performed Overall Cognitive Status: Impaired/Different from baseline Area of Impairment: Safety/judgement                         Safety/Judgement: Decreased awareness of safety;Decreased awareness of deficits            Exercises      General Comments        Pertinent Vitals/Pain Pain Assessment: No/denies pain    Home Living                      Prior Function            PT Goals (current goals can now be found in the care plan section) Acute Rehab PT Goals Patient Stated Goal: Would like to get out of the hospital Potential to Achieve Goals: Good Progress towards PT goals:  Progressing toward goals    Frequency    Min 3X/week      PT Plan Current plan remains appropriate    Co-evaluation              AM-PAC PT "6 Clicks" Mobility   Outcome Measure  Help needed turning from your back to your side while in a flat bed without using bedrails?: None Help needed moving from lying on your back to sitting on the side of a flat bed without using bedrails?: None Help needed moving to and from a bed to a chair (including a wheelchair)?: A Little Help needed standing up from a chair using your arms (e.g., wheelchair or bedside chair)?: A Little Help needed  to walk in hospital room?: A Little Help needed climbing 3-5 steps with a railing? : A Little 6 Click Score: 20    End of Session Equipment Utilized During Treatment: Gait belt Activity Tolerance: Patient tolerated treatment well Patient left: in bed;with call bell/phone within reach;with family/visitor present Nurse Communication: Mobility status PT Visit Diagnosis: Unsteadiness on feet (R26.81);Other abnormalities of gait and mobility (R26.89);Muscle weakness (generalized) (M62.81)     Time: 7353-2992 PT Time Calculation (min) (ACUTE ONLY): 15 min  Charges:  $Gait Training: 8-22 mins                     Bonney Leitz , PTA Acute Rehabilitation Services Pager [phone] Office [phone]     Edd Reppert Artis Delay 11/18/2020, 2:53 PM

## 2020-12-14 NOTE — Progress Notes (Deleted)
Patient ID: Terrence Henry, male   DOB: 1981/10/17, 40 y.o.   MRN: [id number]   After hospitalization 11/21-12/08/2020 after found unconscious, did not respond to Narcan, and EMS was called.    Active Problems:   Cardiac arrest (HCC)   Shock (HCC)   Encounter for central line placement   Type 2 MI (myocardial infarction) (HCC)   Terrence Henry a 40 y.o.malewith PMH significant for polysubstance abuse On11/21/2021, patient was brought to the ED as a cardiac arrest. He was found unconscious by family in the bathroom with needles around. Unknown downtime. Family given Narcan without success before EMS arrived. EMS achieved ROSC after 20 minutes of CPR, epinephrine x5 and defibrillation. Brought in to the ED with a King airway, in normal sinus rhythm and blood pressure low in 60s.  In the ED, he was intubated for a low GCS of 3, started on IV fluids and Levophed. Per cardiology, primary cause of cardiac arrest was not cardiac in origin and rather likely to be an overdose, hence patient did not have an emergent cardiac cath.  Echocardiogram showed EF of 40 to 45% with global hypokinesis and grade 1 diastolic dysfunction. Admitted to ICU, on mechanical ventilation, therapeutic hypothermia protocol. Extubated on 11/27. Transferred to hospitalist service on 12/2. Patient has significantly improved in physical and mental status in last several days. He qualified for CIR and was waiting for bed but at this time, he has progressed significantly and is strong enough to go home.  Cardiac arrest  Cardiogenic shock postarrest Acute hypoxic hypercapnic respiratory failure -Brought in for cardiac arrest likely due to heroin overdose. Successfully resuscitated by EMS prior to arrival.  -Initially admitted to ICU, on mechanical ventilation, therapeutic hypothermia protocol. -Required IV fluid and pressors. Hemodynamics improved. -Currently normal sinus rhythm, stable blood pressure and  breathing in room air.  Hypoxic ischemic encephalopathy Intermittent delirium  -Secondary to unknown downtime during cardiac arrest -Mental status has gradually improved. Much better today. Off restraints for last 24 hours. Currently on Seroquel 100 mg nightly.   Discharged with the same.  Continue to taper as an outpatient.    Diarrhea Non-anion gap metabolic acidosis -GI pathogen panel negative. Suspected to be related to tube feeding. Diarrhea stopped after tube feeding was stopped.   Acute kidney injury -Creatinine peaked at 2.53 secondary to shock. Creatinine improved back to normal. Continue to monitor  Physical deconditioning -He qualified for CIR and was waiting for bed but at this time, he has progressed significantly and is strong enough to go home.  Polysubstance abuse -Counseled to quit

## 2020-12-15 ENCOUNTER — Inpatient Hospital Stay: Payer: Self-pay | Admitting: Physician Assistant

## 2020-12-15 DIAGNOSIS — R739 Hyperglycemia, unspecified: Secondary | ICD-10-CM

## 2020-12-16 ENCOUNTER — Ambulatory Visit: Payer: Self-pay | Attending: Critical Care Medicine

## 2020-12-16 ENCOUNTER — Other Ambulatory Visit: Payer: Self-pay

## 2020-12-16 ENCOUNTER — Ambulatory Visit: Payer: Self-pay | Admitting: Physician Assistant

## 2020-12-16 ENCOUNTER — Other Ambulatory Visit: Payer: Self-pay | Admitting: Physician Assistant

## 2020-12-16 ENCOUNTER — Other Ambulatory Visit (HOSPITAL_COMMUNITY)
Admission: RE | Admit: 2020-12-16 | Discharge: 2020-12-16 | Disposition: A | Discharge status: Home / Self Care | Payer: Self-pay | Source: Ambulatory Visit | Attending: Critical Care Medicine | Admitting: Critical Care Medicine

## 2020-12-16 VITALS — BP 117/78 | HR 87

## 2020-12-16 DIAGNOSIS — R739 Hyperglycemia, unspecified: Secondary | ICD-10-CM

## 2020-12-16 DIAGNOSIS — B1920 Unspecified viral hepatitis C without hepatic coma: Secondary | ICD-10-CM

## 2020-12-16 DIAGNOSIS — B350 Tinea barbae and tinea capitis: Secondary | ICD-10-CM

## 2020-12-16 DIAGNOSIS — G4700 Insomnia, unspecified: Secondary | ICD-10-CM

## 2020-12-16 DIAGNOSIS — R202 Paresthesia of skin: Secondary | ICD-10-CM

## 2020-12-16 DIAGNOSIS — R200 Anesthesia of skin: Secondary | ICD-10-CM

## 2020-12-16 DIAGNOSIS — R300 Dysuria: Secondary | ICD-10-CM | POA: Insufficient documentation

## 2020-12-16 DIAGNOSIS — R197 Diarrhea, unspecified: Secondary | ICD-10-CM

## 2020-12-16 DIAGNOSIS — F1911 Other psychoactive substance abuse, in remission: Secondary | ICD-10-CM

## 2020-12-16 DIAGNOSIS — F1920 Other psychoactive substance dependence, uncomplicated: Secondary | ICD-10-CM

## 2020-12-16 DIAGNOSIS — T50901S Poisoning by unspecified drugs, medicaments and biological substances, accidental (unintentional), sequela: Secondary | ICD-10-CM

## 2020-12-16 MED ORDER — KETOCONAZOLE 2 % EX CREA: 1.0000 "application " | TOPICAL_CREAM | Freq: Every day | CUTANEOUS | 0 refills | Status: DC

## 2020-12-16 MED ORDER — GABAPENTIN 100 MG PO CAPS: 100.0000 mg | ORAL_CAPSULE | Freq: Every day | ORAL | 0 refills | Status: DC

## 2020-12-16 MED ORDER — OMEPRAZOLE 20 MG PO CPDR: 20.0000 mg | DELAYED_RELEASE_CAPSULE | Freq: Every day | ORAL | 0 refills | Status: DC

## 2020-12-16 MED ORDER — QUETIAPINE FUMARATE 25 MG PO TABS: 25.0000 mg | ORAL_TABLET | Freq: Every day | ORAL | 0 refills | Status: DC

## 2020-12-16 MED FILL — OMEPRAZOLE 20 MG CAP: 20 | 14 days supply | Qty: 14 | Fill #0

## 2020-12-16 MED FILL — GABAPENTIN 100 MG CAPSULE: 100 | 30 days supply | Qty: 30 | Fill #0

## 2020-12-16 MED FILL — KETOCONAZOLE 2% CREAM: 2 | 15 days supply | Qty: 15 | Fill #0

## 2020-12-16 MED FILL — QUETIAPINE FUMARATE 25 MG T: 25 | 30 days supply | Qty: 30 | Fill #0

## 2020-12-16 NOTE — Progress Notes (Signed)
New Patient Office Visit  Subjective:  Patient ID: Terrence Henry, male    DOB: [birth year]  Age: 40 y.o. MRN: [id number]  CC:  Chief Complaint  Patient presents with  . Hospitalization Follow-up    HPI [name] presents for hospital follow-up.  Patient was hospitalized from November 21 through November 18, 2020.  HPI from hospital note JUWAN Henry a 40 y.o.malewith PMH significant for polysubstance abuse On11/21/2021, patient was brought to the ED as a cardiac arrest. He was found unconscious by family in the bathroom with needles around. Unknown downtime. Family given Narcan without success before EMS arrived. EMS achieved ROSC after 20 minutes of CPR, epinephrine x5 and defibrillation. Brought in to the ED with a King airway, in normal sinus rhythm and blood pressure low in 60s.  In the ED, he was intubated for a low GCS of 3, started on IV fluids and Levophed. Per cardiology, primary cause of cardiac arrest was not cardiac in origin and rather likely to be an overdose, hence patient did not have an emergent cardiac cath.  Echocardiogram showed EF of 40 to 45% with global hypokinesis and grade 1 diastolic dysfunction. Admitted to ICU, on mechanical ventilation, therapeutic hypothermia protocol. Extubated on 11/27. Transferred to hospitalist service on 12/2. Patient has significantly improved in physical and mental status in last several days. He qualified for CIR and was waiting for bed but at this time, he has progressed significantly and is strong enough to go home.  Hospital Course   Cardiac arrest  Cardiogenic shock postarrest Acute hypoxic hypercapnic respiratory failure -Brought in for cardiac arrest likely due to heroin overdose. Successfully resuscitated by EMS prior to arrival.  -Initially admitted to ICU, on mechanical ventilation, therapeutic hypothermia protocol. -Required IV fluid and pressors. Hemodynamics improved. -Currently normal sinus  rhythm, stable blood pressure and breathing in room air.  Hypoxic ischemic encephalopathy Intermittent delirium  -Secondary to unknown downtime during cardiac arrest -Mental status has gradually improved. Much better today. Off restraints for last 24 hours. Currently on Seroquel 100 mg nightly.   Discharged with the same.  Continue to taper as an outpatient.    Diarrhea Non-anion gap metabolic acidosis -GI pathogen panel negative. Suspected to be related to tube feeding. Diarrhea stopped after tube feeding was stopped.   Acute kidney injury -Creatinine peaked at 2.53 secondary to shock. Creatinine improved back to normal. Continue to monitor  Physical deconditioning -He qualified for CIR and was waiting for bed but at this time, he has progressed significantly and is strong enough to go home.  Polysubstance abuse -Counseled to quit  Reports today that since he has been home he has been experiencing some hair loss at the back of his head, diarrhea, numbness in his fingertips, and difficulty sleeping.   Reports that he has had some hair loss at the back of his head, states that he feels the rash back there looks like it is getting bigger, states that he does notice it feeling somewhat tender when he is sleeping.  Has not tried anything for relief.  Denies any new fragrances, lotions, body washes, medications.  Reports that he has been having episodes of diarrhea on a daily basis since he has been home from the hospital, states that he will have some abdominal cramping prior to the episode, resolved with the episode, denies any blood or mucus, any nocturnal events.  Denies any normal soft formed bowel movements.  Reports that he is drinking 3-4 bottles of  water a day, is unsure of his fiber intake.  Reports that he has been experiencing numbness in all of his fingertips, states that it will feel burning at times.  Reports that this is new since being home from the hospital.   Reports that he noticed it while he was at the end of his hospitalization.  Denies tingling or radiation.  Has not tried anything for relief.  Reports that he has been having difficulty sleeping, states that he was prescribed Seroquel while he was in the hospital, states that he has been out of it for the last 4 days, states that he was sleeping well  Reports that he has not used any substances since he has been home, states that he has plans to go to inpatient rehab after his upcoming court dates.  Has not started any other type of counseling  Reports that he has been having discomfort when his urination begins, states it feels like pressure, states that he feels his flow is altered, denies burning, or discharge.  Denies urgency or increase in frequency  Past Medical History:  Diagnosis Date  . Acute hepatitis A 03/2020  . Anxiety   . Carpal tunnel syndrome    bilateral  . Drug addiction (HCC)   . Elevated LFTs   . Hepatitis C virus infection   . Inguinal hernia    left  . Overdose   . Panic attacks   . Tobacco abuse   . Venereal wart     Past Surgical History:  Procedure Laterality Date  . MANDIBLE FRACTURE SURGERY     Altercation    Family History  Problem Relation Age of Onset  . Hypertension Mother   . Hyperlipidemia Mother   . Diabetes Father     Social History   Socioeconomic History  . Marital status: Single    Spouse name: Not on file  . Number of children: Not on file  . Years of education: Not on file  . Highest education level: Not on file  Occupational History  . Occupation: Education administrator    Comment: self  Tobacco Use  . Smoking status: Current Some Day Smoker    Packs/day: 1.00    Years: 15.00    Pack years: 15.00    Types: Cigarettes  . Smokeless tobacco: Never Used  Substance and Sexual Activity  . Alcohol use: Yes    Alcohol/week: 0.0 standard drinks  . Drug use: Yes    Types: Methamphetamines, MDMA (Ecstacy), Cocaine, IV    Comment: Heroin  .  Sexual activity: Yes    Birth control/protection: Condom  Other Topics Concern  . 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  HENT: Negative.   Eyes: Negative.   Respiratory: Negative.   Cardiovascular: Negative.   Gastrointestinal: Positive for abdominal pain and diarrhea. Negative for nausea and vomiting.  Endocrine: Negative.   Genitourinary: Positive for difficulty urinating. Negative for dysuria, frequency, genital sores, hematuria, penile discharge, penile pain, penile  swelling and scrotal swelling.  Musculoskeletal: Negative.   Skin: Positive for rash.  Allergic/Immunologic: Negative.   Neurological: Negative.   Psychiatric/Behavioral: Positive for sleep disturbance. Negative for self-injury and suicidal ideas.    Objective:   Today's Vitals: There were no vitals taken for this visit.  Physical Exam  Assessment & Plan:   Problem List Items Addressed This Visit      Digestive   Hepatitis C virus infection   Relevant Medications   ketoconazole (NIZORAL) 2 % cream   Other Relevant Orders   HIV antibody (with reflex) (Completed)   Ambulatory referral to Infectious Disease     Other   History of substance abuse (HCC)   Relevant Orders   CBC with Differential/Platelet (Completed)   Comp. Metabolic Panel (12) (Completed)    Other Visit Diagnoses    Hyperglycemia    -  Primary   Relevant Orders   Hemoglobin A1c (Completed)   Tinea capitis       Relevant Medications   ketoconazole  (NIZORAL) 2 % cream   Numbness and tingling in both hands       Relevant Medications   gabapentin (NEURONTIN) 100 MG capsule   Other Relevant Orders   TSH (Completed)   Dysuria       Relevant Orders   PSA (Completed)   RPR (Completed)   Urine Culture   Urine cytology ancillary only   Insomnia, unspecified type       Relevant Medications   QUEtiapine (SEROQUEL) 25 MG tablet   Diarrhea, unspecified type       Relevant Medications   omeprazole (PRILOSEC) 20 MG capsule      Outpatient Encounter Medications as of 12/16/2020  Medication Sig  . gabapentin (NEURONTIN) 100 MG capsule Take 1 capsule (100 mg total) by mouth at bedtime.  Marland Kitchen ketoconazole (NIZORAL) 2 % cream Apply 1 application topically daily.  Marland Kitchen omeprazole (PRILOSEC) 20 MG capsule Take 1 capsule (20 mg total) by mouth daily.  . QUEtiapine (SEROQUEL) 25 MG tablet Take 1 tablet (25 mg total) by mouth at bedtime.  . [DISCONTINUED] QUEtiapine (SEROQUEL) 25 MG tablet Take 4 tablets at bedtime for 7 days, 3 tabs at bedtime for 7 days, 2 tablets at bedtime for 7 days then 1 tablet/day for 7 days then stop   No facility-administered encounter medications on file as of 12/16/2020.  1. Hyperglycemia No nursing was available today on the mobile unit, [facility].  Patient did have episodes of hyperglycemia while hospitalized, no previous diagnoses of diabetes. - Hemoglobin A1c  2. Tinea capitis Patient education given, trial ketoconazole, encourage patient to use Selsun Blue shampoo - ketoconazole (NIZORAL) 2 % cream; Apply 1 application topically daily.  Dispense: 15 g; Refill: 0  3. Numbness and tingling in both hands Trial gabapentin 100 mg at bedtime - gabapentin (NEURONTIN) 100 MG capsule; Take 1 capsule (100 mg total) by mouth at bedtime.  Dispense: 60 capsule; Refill: 0 - TSH  4. Dysuria Encourage patient to increase water intake - PSA - RPR - Urine Culture -  Urine cytology ancillary only  5. Hepatitis C virus infection without hepatic coma, unspecified chronicity Will refer patient to infectious disease - HIV antibody (with reflex)  6. History of substance abuse [facility]) Patient declined counseling referral, has plans to go to inpatient rehab - CBC with Differential/Platelet - Comp. Metabolic Panel (12)  7. Insomnia, unspecified type Resume Seroquel, trial 25 mg -  QUEtiapine (SEROQUEL) 25 MG tablet; Take 1 tablet (25 mg total) by mouth at bedtime.  Dispense: 60 tablet; Refill: 0  8. Diarrhea, unspecified type Patient education given, trial Prilosec for 2 weeks, increase fiber intake GI pathogen panel completed in hospital was negative - omeprazole (PRILOSEC) 20 MG capsule; Take 1 capsule (20 mg total) by mouth daily.  Dispense: 14 capsule; Refill: 0  [facility] to [facility] care  I have reviewed the patient's medical history (PMH, PSH, Social History, Family History, Medications, and allergies) , and have been updated if relevant. I spent 45 minutes reviewing chart and  face to face time with patient.    Follow-up: No follow-ups on file.   Kasandra Knudsen Mayers, PA-C

## 2020-12-16 NOTE — Patient Instructions (Addendum)
For the rash on your head, I encourage you to use Selsun Blue shampoo, and use the cream that I sent to the pharmacy.  For the numbness in your fingers, I encourage you to use gabapentin 100 mg at bedtime.  You will resume taking 25 mg of Seroquel to help you with insomnia  We have set up an appointment for [facility], I am also going to send a referral to you be seen at infectious disease for your hepatitis C  Please [facility] for your labs, we will call you with the lab results  For your diarrhea I have sent a medication for your stomach I would like you to take this once a day for the next 2 weeks, I encourage you to increase your fiber intake and water intake  Please let us know if there is anything else we can do for you  Roney Jaffe, PA-C Physician Assistant [facility] [email or web address]   Diarrhea, Adult Diarrhea is frequent loose and watery bowel movements. Diarrhea can make you feel weak and cause you to become dehydrated. Dehydration can make you tired and thirsty, cause you to have a dry mouth, and decrease how often you urinate. Diarrhea typically lasts 2-3 days. However, it can last longer if it is a sign of something more serious. It is important to treat your diarrhea as told by your health care provider. Follow these instructions at home: Eating and drinking     Follow these recommendations as told by your health care provider:  Take an oral rehydration solution (ORS). This is an over-the-counter medicine that helps return your body to its normal balance of nutrients and water. It is found at pharmacies and retail stores.  Drink plenty of fluids, such as water, ice chips, diluted fruit juice, and low-calorie sports drinks. You can drink milk also, if desired.  Avoid drinking fluids that contain a lot of sugar or  caffeine, such as energy drinks, sports drinks, and soda.  Eat bland, easy-to-digest foods in small amounts as you are able. These foods include bananas, applesauce, rice, lean meats, toast, and crackers.  Avoid alcohol.  Avoid spicy or fatty foods.  Medicines  Take over-the-counter and prescription medicines only as told by your health care provider.  If you were prescribed an antibiotic medicine, take it as told by your health care provider. Do not stop using the antibiotic even if you start to feel better. General instructions   Wash your hands often using soap and water. If soap and water are not available, use a hand sanitizer. Others in the household should wash their hands as well. Hands should be washed: ? After using the toilet or changing a diaper. ? Before preparing, cooking, or serving food. ? While caring for a sick person or while visiting someone in a hospital.  Drink enough fluid to keep your urine pale yellow.  Rest at home while you recover.  Watch your condition for any changes.  Take a warm bath to relieve any burning or pain from frequent diarrhea episodes.  Keep all follow-up visits as told by your health care provider. This is important. Contact a health care provider if:  You have a fever.  Your diarrhea gets worse.  You have new symptoms.  You cannot keep fluids down.  You feel light-headed or dizzy.  You have a headache.  You have muscle cramps. Get help right away  if:  You have chest pain.  You feel extremely weak or you faint.  You have bloody or black stools or stools that look like tar.  You have severe pain, cramping, or bloating in your abdomen.  You have trouble breathing or you are breathing very quickly.  Your heart is beating very quickly.  Your skin feels cold and clammy.  You feel confused.  You have signs of dehydration, such as: ? Dark urine, very little urine, or no urine. ? Cracked lips. ? Dry mouth. ? Sunken  eyes. ? Sleepiness. ? Weakness. Summary  Diarrhea is frequent loose and watery bowel movements. Diarrhea can make you feel weak and cause you to become dehydrated.  Drink enough fluids to keep your urine pale yellow.  Make sure that you wash your hands after using the toilet. If soap and water are not available, use hand sanitizer.  Contact a health care provider if your diarrhea gets worse or you have new symptoms.  Get help right away if you have signs of dehydration. This information is not intended to replace advice given to you by your health care provider. Make sure you discuss any questions you have with your health care provider. Document Revised: 04/15/2019 Document Reviewed: 05/03/2018 Elsevier Patient Education  [employer].    Paresthesia Paresthesia is an abnormal burning or prickling sensation. It is usually felt in the hands, arms, legs, or feet. However, it may occur in any part of the body. Usually, paresthesia is not painful. It may feel like:  Tingling or numbness.  Buzzing.  Itching. Paresthesia may occur without any clear cause, or it may be caused by:  Breathing too quickly (hyperventilation).  Pressure on a nerve.  An underlying medical condition.  Side effects of a medication.  Nutritional deficiencies.  Exposure to toxic chemicals. Most people experience temporary (transient) paresthesia at some time in their lives. For some people, it may be long-lasting (chronic) because of an underlying medical condition. If you have paresthesia that lasts a long time, you may need to be evaluated by your health care provider. Follow these instructions at home: Alcohol use   Do not drink alcohol if: ? Your health care provider tells you not to drink. ? You are pregnant, may be pregnant, or are planning to become pregnant.  If you drink alcohol: ? Limit how much you use to:  0-1 drink a day for women.  0-2 drinks a day for men. ? Be aware of  how much alcohol is in your drink. In the [place] one drink equals one 12 oz bottle of beer (355 mL), one 5 oz glass of wine (148 mL), or one 1 oz glass of hard liquor (44 mL). Nutrition   Eat a healthy diet. This includes: ? Eating foods that are high in fiber, such as fresh fruits and vegetables, whole grains, and beans. ? Limiting foods that are high in fat and processed sugars, such as fried or sweet foods. General instructions  Take over-the-counter and prescription medicines only as told by your health care provider.  Do not use any products that contain nicotine or tobacco, such as cigarettes and e-cigarettes. These can keep blood from reaching damaged nerves. If you need help quitting, ask your health care provider.  If you have diabetes, work closely with your health care provider to keep your blood sugar under control.  If you have numbness in your feet: ? Check every day for signs of injury or infection. Watch for redness,  warmth, and swelling. ? Wear padded socks and comfortable shoes. These help protect your feet.  Keep all follow-up visits as told by your health care provider. This is important. Contact a health care provider if you:  Have paresthesia that gets worse or does not go away.  Have a burning or prickling feeling that gets worse when you walk.  Have pain, cramps, or dizziness.  Develop a rash. Get help right away if you:  Feel weak.  Have trouble walking or moving.  Have problems with speech, understanding, or vision.  Feel confused.  Cannot control your bladder or bowel movements.  Have numbness after an injury.  Develop new weakness in an arm or leg.  Faint. Summary  Paresthesia is an abnormal burning or prickling sensation that is usually felt in the hands, arms, legs, or feet. It may also occur in other parts of the body.  Paresthesia may occur without any clear cause, or it may be caused by breathing too quickly (hyperventilation),  pressure on a nerve, an underlying medical condition, side effects of a medication, nutritional deficiencies, or exposure to toxic chemicals.  If you have paresthesia that lasts a long time, you may need to be evaluated by your health care provider. This information is not intended to replace advice given to you by your health care provider. Make sure you discuss any questions you have with your health care provider. Document Revised: 12/23/2018 Document Reviewed: 12/06/2017 Elsevier Patient Education  2020 Elsevier Inc.    Tinea Versicolor  Tinea versicolor is a common fungal infection of the skin. It causes a rash that appears as light or dark patches on the skin. The rash most often occurs on the chest, back, neck, or upper arms. This condition is more common during warm weather. Other than affecting how your skin looks, tinea versicolor usually does not cause other problems. In most cases, the infection goes away in a few weeks with treatment. It may take a few months for the patches on your skin to return to your usual skin color. What are the causes? This condition occurs when a type of fungus that is normally present on the skin starts to overgrow. This fungus is a kind of yeast. The exact cause of the overgrowth is not known. This condition cannot be passed from one person to another (it is not contagious). What increases the risk? This condition is more likely to develop when certain factors are present, such as:  Heat and humidity.  Sweating too much.  Hormone changes.  Oily skin.  A weak disease-fighting system (immunesystem). What are the signs or symptoms? Symptoms of this condition include:  A rash of light or dark patches on your skin. The rash may have: ? Patches of tan or pink spots (on light skin). ? Patches of white or brown spots (on dark skin). ? Patches of skin that do not tan. ? Well-marked edges. ? Scales on the discolored areas.  Mild itching. How is  this diagnosed? A health care provider can usually diagnose this condition by looking at your skin. During the exam, he or she may use ultraviolet (UV) light to see how much of your skin has been affected. In some cases, a skin sample may be taken by scraping the rash. This sample will be viewed under a microscope to check for yeast overgrowth. How is this treated? Treatment for this condition may include:  Dandruff shampoo that is applied to the affected skin during showers or bathing.  Over-the-counter medicated skin cream, lotion, or soaps.  Prescription antifungal medicine in the form of skin cream or pills.  Medicine to help reduce itching. Follow these instructions at home:  Take over-the-counter and prescription medicines only as told by your health care provider.  Apply dandruff shampoo to the affected area if your health care provider told you to do that. You may be instructed to scrub the affected skin for several minutes each day.  Do not scratch the affected area of skin.  Avoid hot and humid conditions.  Do not use tanning booths.  Try to avoid sweating a lot. Contact a health care provider if:  Your symptoms get worse.  You have a fever.  You have redness, swelling, or pain at the site of your rash.  You have fluid or blood coming from your rash.  Your rash feels warm to the touch.  You have pus or a bad smell coming from your rash.  Your rash returns (recurs) after treatment. Summary  Tinea versicolor is a common fungal infection of the skin. It causes a rash that appears as light or dark patches on the skin.  The rash most often occurs on the chest, back, neck, or upper arms.  A health care provider can usually diagnose this condition by looking at your skin.  Treatment may include applying shampoo to the skin and taking or applying medicines. This information is not intended to replace advice given to you by your health care provider. Make sure you  discuss any questions you have with your health care provider. Document Revised: 11/09/2017 Document Reviewed: 07/31/2017 Elsevier Patient Education  2020 [employer].

## 2020-12-17 DIAGNOSIS — R200 Anesthesia of skin: Secondary | ICD-10-CM | POA: Insufficient documentation

## 2020-12-17 DIAGNOSIS — G4700 Insomnia, unspecified: Secondary | ICD-10-CM | POA: Insufficient documentation

## 2020-12-17 DIAGNOSIS — R300 Dysuria: Secondary | ICD-10-CM | POA: Insufficient documentation

## 2020-12-17 DIAGNOSIS — F1920 Other psychoactive substance dependence, uncomplicated: Secondary | ICD-10-CM | POA: Insufficient documentation

## 2020-12-17 DIAGNOSIS — J9602 Acute respiratory failure with hypercapnia: Secondary | ICD-10-CM | POA: Insufficient documentation

## 2020-12-17 DIAGNOSIS — R197 Diarrhea, unspecified: Secondary | ICD-10-CM | POA: Insufficient documentation

## 2020-12-17 DIAGNOSIS — J9601 Acute respiratory failure with hypoxia: Secondary | ICD-10-CM | POA: Insufficient documentation

## 2020-12-17 DIAGNOSIS — B350 Tinea barbae and tinea capitis: Secondary | ICD-10-CM | POA: Insufficient documentation

## 2020-12-17 DIAGNOSIS — F1190 Opioid use, unspecified, uncomplicated: Secondary | ICD-10-CM | POA: Insufficient documentation

## 2020-12-17 DIAGNOSIS — T50901A Poisoning by unspecified drugs, medicaments and biological substances, accidental (unintentional), initial encounter: Secondary | ICD-10-CM | POA: Insufficient documentation

## 2020-12-17 HISTORY — DX: Paresthesia of skin: R20.0

## 2020-12-17 LAB — CBC WITH DIFFERENTIAL/PLATELET
Basophils Absolute: 0.1 10*3/uL (ref 0.0–0.2)
Basos: 1 %
EOS (ABSOLUTE): 0.2 10*3/uL (ref 0.0–0.4)
Eos: 2 %
Hematocrit: 44 % (ref 37.5–51.0)
Hemoglobin: 14.9 g/dL (ref 13.0–17.7)
Immature Grans (Abs): 0.1 10*3/uL (ref 0.0–0.1)
Immature Granulocytes: 1 %
Lymphocytes Absolute: 3.9 10*3/uL — ABNORMAL HIGH (ref 0.7–3.1)
Lymphs: 31 %
MCH: 30.7 pg (ref 26.6–33.0)
MCHC: 33.9 g/dL (ref 31.5–35.7)
MCV: 91 fL (ref 79–97)
Monocytes Absolute: 0.8 10*3/uL (ref 0.1–0.9)
Monocytes: 6 %
Neutrophils Absolute: 7.6 10*3/uL — ABNORMAL HIGH (ref 1.4–7.0)
Neutrophils: 59 %
Platelets: 212 10*3/uL (ref 150–450)
RBC: 4.86 x10E6/uL (ref 4.14–5.80)
RDW: 14.5 % (ref 11.6–15.4)
WBC: 12.6 10*3/uL — ABNORMAL HIGH (ref 3.4–10.8)

## 2020-12-17 LAB — COMP. METABOLIC PANEL (12)
AST: 16 IU/L (ref 0–40)
Albumin/Globulin Ratio: 1.6 (ref 1.2–2.2)
Albumin: 4.2 g/dL (ref 4.0–5.0)
Alkaline Phosphatase: 65 IU/L (ref 44–121)
BUN/Creatinine Ratio: 11 (ref 9–20)
BUN: 7 mg/dL (ref 6–20)
Bilirubin Total: 0.4 mg/dL (ref 0.0–1.2)
Calcium: 9.5 mg/dL (ref 8.7–10.2)
Chloride: 102 mmol/L (ref 96–106)
Creatinine, Ser: 0.61 mg/dL — ABNORMAL LOW (ref 0.76–1.27)
GFR calc Af Amer: 146 mL/min/{1.73_m2} (ref >59)
GFR calc non Af Amer: 127 mL/min/{1.73_m2} (ref >59)
Globulin, Total: 2.6 g/dL (ref 1.5–4.5)
Glucose: 89 mg/dL (ref 65–99)
Potassium: 4.5 mmol/L (ref 3.5–5.2)
Sodium: 140 mmol/L (ref 134–144)
Total Protein: 6.8 g/dL (ref 6.0–8.5)

## 2020-12-17 LAB — HIV ANTIBODY (ROUTINE TESTING W REFLEX): HIV Screen 4th Generation wRfx: NONREACTIVE

## 2020-12-17 LAB — HEMOGLOBIN A1C
Est. average glucose Bld gHb Est-mCnc: 105 mg/dL
Hgb A1c MFr Bld: 5.3 % (ref 4.8–5.6)

## 2020-12-17 LAB — PSA: Prostate Specific Ag, Serum: 0.4 ng/mL (ref 0.0–4.0)

## 2020-12-17 LAB — RPR: RPR Ser Ql: NONREACTIVE

## 2020-12-17 LAB — TSH: TSH: 1.47 u[IU]/mL (ref 0.450–4.500)

## 2020-12-18 LAB — URINE CULTURE: Organism ID, Bacteria: NO GROWTH

## 2020-12-20 LAB — URINE CYTOLOGY ANCILLARY ONLY
Bacterial Vaginitis-Urine: NEGATIVE
Candida Urine: NEGATIVE
Chlamydia: NEGATIVE
Comment: NEGATIVE
Comment: NEGATIVE
Comment: NORMAL
Neisseria Gonorrhea: NEGATIVE
Trichomonas: NEGATIVE

## 2020-12-21 ENCOUNTER — Telehealth: Payer: Self-pay | Admitting: *Deleted

## 2020-12-21 NOTE — Telephone Encounter (Signed)
-----   Message from Roney Jaffe, [place] sent at 12/21/2020  8:20 AM EST ----- Please call patient and let him know that he does have a slightly elevated white blood cell count, this could be due to the rash that he is experiencing.  His kidney and liver function were within normal limits, however he does show signs of dehydration.  His thyroid and prostate were within normal limits.  His screening for syphilis, HIV, gonorrhea, chlamydia and trichomonas were negative, I do encourage him to continue working on increasing hydration, keeping note of episodes of difficulty urinating.  His A1c was within normal limits.

## 2020-12-21 NOTE — Telephone Encounter (Signed)
Medical Assistant left message on patient's home and cell voicemail. Voicemail states to give a call back to Neha Waight with [facility] at [phone]. 

## 2020-12-22 ENCOUNTER — Other Ambulatory Visit: Payer: Self-pay

## 2020-12-22 ENCOUNTER — Telehealth (INDEPENDENT_AMBULATORY_CARE_PROVIDER_SITE_OTHER): Payer: Self-pay

## 2020-12-22 ENCOUNTER — Ambulatory Visit: Payer: Self-pay | Attending: Family Medicine

## 2020-12-22 NOTE — Telephone Encounter (Signed)
Copied from CRM [id number]. Topic: Quick Communication - See Telephone Encounter >> Dec 21, 2020  3:34 PM Aretta Nip wrote: CRM for notification. See Telephone encounter for: 12/21/20. [phone] Pt is wanting results form mobile unit needs lab results 12/16/2020

## 2020-12-23 ENCOUNTER — Telehealth: Payer: Self-pay

## 2020-12-23 ENCOUNTER — Ambulatory Visit (INDEPENDENT_AMBULATORY_CARE_PROVIDER_SITE_OTHER): Payer: Self-pay | Admitting: Infectious Diseases

## 2020-12-23 ENCOUNTER — Encounter: Payer: Self-pay | Admitting: Infectious Diseases

## 2020-12-23 VITALS — BP 142/85 | HR 87 | Wt 231.0 lb

## 2020-12-23 DIAGNOSIS — F1910 Other psychoactive substance abuse, uncomplicated: Secondary | ICD-10-CM

## 2020-12-23 DIAGNOSIS — B1920 Unspecified viral hepatitis C without hepatic coma: Secondary | ICD-10-CM

## 2020-12-23 DIAGNOSIS — Z7185 Encounter for immunization safety counseling: Secondary | ICD-10-CM

## 2020-12-23 DIAGNOSIS — Z1159 Encounter for screening for other viral diseases: Secondary | ICD-10-CM

## 2020-12-23 NOTE — Assessment & Plan Note (Signed)
Will get more labs for planning treatment for hepatitis C including US abdomen elastography Fu in 4 weeks for discussing lab results/treatment plan

## 2020-12-23 NOTE — Progress Notes (Signed)
[facility]                                      [address], [place], [place], [address]                                               Phn. [phone]; Fax: [phone]                                                               Date: 12/23/2020 Reason for Visit: Hepatitis C    HPI: Terrence Henry is a 40 y.o.old male with PMHof polysubstance abuse and hepatitis A who is here for evaluation and management of Hepatitis C. Of note, patient was recently hospitalized 11/21-12/9 for a cardiac arrest 2/2 drug overdose.  His HCV ab was negativ ein 06/30/19 and was reactive in 03/11/20 along with a positive HCV RNA in 04/23/20. Has not received treatment to date.   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 Prior to that he was actively engaged in IV meth and heroin. He says he is currently in a rehab. Smoke weed. Smokes 5-6 cigarettes, has stopped drinking alcohol recently.  Sexually active with his girlfriend who has also been tested positive for Hepatitis C. Denies other known sexual partners with Hepatitis C. He thinks his father has issues with Liver.   Denies blood transfusions or tattoos.   Not working currently but says has worked " everywhere " in the past. Most recently worked in Environmental consultant. Lives with his parents   PMH - Substance use Medications - seroquel, porilosec and gabapentin Sx - jaw broke,  Allergies - NKDA  Complains of semisolid stool 3-4 times a day since he left hspital approx a month ago. Denies nausea/vomiting and abdominal pain. Appetite is good and he thinks he is gaining weight.  Denies any  rashes, polyarthralgia  Denies yellowish discoloration of sclera and skin, abdominal pain/distension, hematemesis, black trarry stool  ROS: 11 point ROS done with pertinent positives and negatives listed above. Rest of ROS is negative.   Current Outpatient Medications on File  Prior to Visit  Medication Sig Dispense Refill  . gabapentin (NEURONTIN) 100 MG capsule Take 1 capsule (100 mg total) by mouth at bedtime. 60 capsule 0  . ketoconazole (NIZORAL) 2 % cream Apply 1 application topically daily. 15 g 0  . omeprazole (PRILOSEC) 20 MG capsule Take 1 capsule (20 mg total) by mouth daily. 14 capsule 0  . QUEtiapine (SEROQUEL) 25 MG tablet Take 1 tablet (25 mg total) by mouth at bedtime. 60 tablet 0   No current facility-administered medications on file prior to visit.     No Known Allergies  Past Medical History:  Diagnosis Date  . Acute hepatitis A 03/2020  . Acute respiratory failure with hypoxia and hypercapnia (HCC)   . Anxiety   . Cardiac arrest (HCC) 10/31/2020  . Carpal tunnel syndrome    bilateral  . Drug addiction (HCC)   . Elevated LFTs   .  Hepatitis C virus infection   . Inguinal hernia    left  . Overdose   . Panic attacks   . Rhabdomyolysis 06/30/2019  . Tobacco abuse   . Type 2 MI (myocardial infarction) (HCC)   . Venereal wart    Past Surgical History:  Procedure Laterality Date  . MANDIBLE FRACTURE SURGERY     Altercation   Social History   Socioeconomic History  . Marital status: Single    Spouse name: Not on file  . Number of children: Not on file  . Years of education: Not on file  . Highest education level: Not on file  Occupational History  . Occupation: Education administrator    Comment: self  Tobacco Use  . Smoking status: Current Some Day Smoker    Packs/day: 1.00    Years: 15.00    Pack years: 15.00    Types: Cigarettes  . Smokeless tobacco: Never Used  Substance and Sexual Activity  . Alcohol use: Yes    Alcohol/week: 0.0 standard drinks  . Drug use: Yes    Types: Methamphetamines, MDMA (Ecstacy), Cocaine, IV    Comment: Heroin  . Sexual activity: Yes    Birth control/protection: Condom  Other Topics Concern  . 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, he has a girlfriend lives  with her   Smoker sometimes alcohol, uses heroin and methamphetamine (ice)   Social Determinants of Health   Financial Resource Strain: Not on file  Food Insecurity: Not on file  Transportation Needs: Not on file  Physical Activity: Not on file  Stress: Not on file  Social Connections: Not on file  Intimate Partner Violence: Not on file     Physical exam: BP (!) 142/85   Pulse 87   Wt 231 lb (104.8 kg)   BMI 28.87 kg/m   Gen: Alert and oriented x 3, no acute distress HEENT: [place]/AT, PERL,  no scleral icterus, no pale conjunctivae, hearing normal, oral mucosa moist Neck: Supple, no lymphadenopathy Cardio: Regular rate and rhythm; +S1 and S2; no murmurs, gallops, or rubs Resp: CTAB; no wheezes, rhonchi, or rales GI: Soft, nontender, nondistended, bowel sounds present GU: Musc: Extremities: No cyanosis, clubbing, or edema; +2 PT and DP pulses Skin: No rashes, lesions, or ecchymoses Neuro: No focal deficits Psych: Calm, cooperative   Laboratory  CBC Latest Ref Rng & Units 12/16/2020 11/11/2020 11/10/2020  WBC 3.4 - 10.8 x10E3/uL 12.6(H) 9.6 10.3  Hemoglobin 13.0 - 17.7 g/dL 72.0 12.6(L) 12.0(L)  Hematocrit 37.5 - 51.0 % 44.0 38.5(L) 38.7(L)  Platelets 150 - 450 x10E3/uL 212 224 169   CMP Latest Ref Rng & Units 12/16/2020 11/14/2020 11/13/2020  Glucose 65 - 99 mg/dL 89 947(S) 962(E)  BUN 6 - 20 mg/dL 7 12 16   Creatinine 0.76 - 1.27 mg/dL ) 3.66(Q) 9.47(M)  Sodium 134 - 144 mmol/L 140 139 141  Potassium 3.5 - 5.2 mmol/L 4.5 4.1 4.0  Chloride 96 - 106 mmol/L 102 106 109  CO2 22 - 32 mmol/L - 21(L) 20(L)  Calcium 8.7 - 10.2 mg/dL 9.5 9.0 5.46(T)  Total Protein 6.0 - 8.5 g/dL 6.8 - -  Total Bilirubin 0.0 - 1.2 mg/dL 0.4 - -  Alkaline Phos 44 - 121 IU/L 65 - -  AST 0 - 40 IU/L 16 - -  ALT 0 - 44 U/L - - -    Assessment/Plan: 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 US ABDOMEN COMPLETE W/ELASTOGRAPHY  . Hepatitis C RNA quantitative  . Liver Fibrosis, FibroTest-ActiTest  . Hepatitis C genotype  . Hepatitis B surface antibody,qualitative  . Hepatitis B core antibody, total  . Protime-INR   Substance Use Follows with Rehab  Counseled  Immunization counseling Regarding COVID Booster   Counseling done on the following -Natural progression of hep c, transmission (avoid sharing personal hygiene equipment), prevention, risks of left untreated and treatment options  -Avoid hepatotoxins like alcohol and excessive acetamaminphen (no more than 2 gram a day) -Avoid eating raw sea food -Risks of re-infection  -Hepatitis coinfection and vaccination  Hepatitis serology  Immune to hepatitis A Will check for Hepatitis B surface an ab and core ab today   I spent approx 60 minutes with the patient including review of prior medical records with greater than 50% of time in face to face counsel of the patient.   Patient's labs were reviewed as well as his previous records. Patients questions were addressed and answered.   Electronically signed by:  Odette Fraction, MD Infectious Diseases  Office phone [phone] Fax no. [phone]

## 2020-12-23 NOTE — Telephone Encounter (Signed)
[facility] Patient Advocate Encounter ? ?Insurance verification completed.   ? ?The patient is uninsured and will need patient assistance for medication. ? ?We can complete the application and will need to meet with the patient for signatures and income documentation. ? ?Malikai Gut, CPhT ?Specialty Pharmacy Patient Advocate ?[facility] ?Phone: [phone] ?Fax:  [phone]  ?

## 2020-12-23 NOTE — Assessment & Plan Note (Signed)
Check hepatitis B surface ab and Core ab total

## 2020-12-23 NOTE — Assessment & Plan Note (Signed)
Counseled

## 2020-12-23 NOTE — Assessment & Plan Note (Signed)
Counseled on COVID Booster

## 2020-12-27 NOTE — Telephone Encounter (Signed)
Staff shared information with Medical Asst from [facility], medical asst attempted to contact patient by phone for provide results.

## 2020-12-28 LAB — LIVER FIBROSIS, FIBROTEST-ACTITEST
ALT: 23 U/L (ref 9–46)
Alpha-2-Macroglobulin: 105 mg/dL — ABNORMAL LOW (ref 106–279)
Apolipoprotein A1: 178 mg/dL — ABNORMAL HIGH (ref 94–176)
Bilirubin: 0.3 mg/dL (ref 0.2–1.2)
Fibrosis Score: 0.03
GGT: 50 U/L (ref 3–90)
Haptoglobin: 146 mg/dL (ref 43–212)
Necroinflammat ACT Score: 0.07
Reference ID: 3701712

## 2020-12-28 LAB — HEPATITIS B CORE ANTIBODY, TOTAL: Hep B Core Total Ab: NONREACTIVE

## 2020-12-28 LAB — HEPATITIS C GENOTYPE: HCV Genotype: NOT DETECTED

## 2020-12-28 LAB — HEPATITIS B SURFACE ANTIBODY,QUALITATIVE: Hep B S Ab: NONREACTIVE

## 2020-12-28 LAB — PROTIME-INR
INR: 0.9
Prothrombin Time: 9.8 s (ref 9.0–11.5)

## 2020-12-28 LAB — HEPATITIS C RNA QUANTITATIVE
HCV Quantitative Log: <1.18 log IU/mL
HCV RNA, PCR, QN: <15 IU/mL

## 2020-12-29 ENCOUNTER — Telehealth: Payer: Self-pay | Admitting: *Deleted

## 2020-12-29 ENCOUNTER — Telehealth: Payer: Self-pay

## 2020-12-29 NOTE — Telephone Encounter (Signed)
Relayed test results to patient and scheduled him for hbv vaccine 1/20. Need to provide [facility] financial assistance documentation to patient since he's uninsured.   Elnor Renovato Loyola Mast, RN

## 2020-12-29 NOTE — Telephone Encounter (Signed)
Yes he would.. I mentioned that may be the next step but would receive advice from provider.

## 2020-12-29 NOTE — Telephone Encounter (Signed)
-----   Message from Odette Fraction, MD sent at 12/29/2020  7:04 AM EST ----- Please cancel the [place] elastography for this patient. His HCV RNA is not detected which means he might have spontaneously cleared the virus and he does not need treatment for it.   He is non immune to Hepatitis B and ca get the Hep B vaccine at his convenience either with PCP or with [place].

## 2020-12-29 NOTE — Telephone Encounter (Signed)
Patient verified DOB Patient is aware of results being normal except for needing to increase water intake. Patient is aware of NO STI concerns. Patient complains of urinating with a lot of pressure at the head of his penis. No blood is noted when urinating. Patient complains of discomfort since the removal of his catheter while unconscious in the hospital.

## 2020-12-29 NOTE — Telephone Encounter (Signed)
Labs were all w/in normal limits , I can start a referral to urology if he would like

## 2020-12-29 NOTE — Telephone Encounter (Signed)
Called patient and left vm requesting call back to discuss lab results. Will send mychart message.   Aquiles Ruffini Loyola Mast, RN

## 2020-12-30 ENCOUNTER — Ambulatory Visit (HOSPITAL_COMMUNITY): Payer: Self-pay

## 2020-12-30 ENCOUNTER — Other Ambulatory Visit: Payer: Self-pay

## 2020-12-30 ENCOUNTER — Ambulatory Visit (INDEPENDENT_AMBULATORY_CARE_PROVIDER_SITE_OTHER): Payer: Self-pay

## 2020-12-30 DIAGNOSIS — Z23 Encounter for immunization: Secondary | ICD-10-CM

## 2021-01-03 ENCOUNTER — Other Ambulatory Visit: Payer: Self-pay | Admitting: Physician Assistant

## 2021-01-03 DIAGNOSIS — R300 Dysuria: Secondary | ICD-10-CM

## 2021-01-13 ENCOUNTER — Telehealth: Payer: Self-pay | Admitting: General Practice

## 2021-01-13 NOTE — Telephone Encounter (Signed)
Pt requesting a refill for the following medications: gabapentin (NEURONTIN) 100 MG capsule omeprazole (PRILOSEC) 20 MG capsule and QUEtiapine (SEROQUEL) 25 MG tablet  Pt's Pharmacy: [facility]   Please advise and thank you

## 2021-01-13 NOTE — Telephone Encounter (Signed)
Patient has enough at home for the month of February.. he will receive additional refills at his follow up visit on 02/07/21. Patient needs to keep his [facility] appointment next week

## 2021-01-20 ENCOUNTER — Other Ambulatory Visit: Payer: Self-pay | Admitting: Physician Assistant

## 2021-01-20 ENCOUNTER — Telehealth: Payer: Self-pay

## 2021-01-20 ENCOUNTER — Other Ambulatory Visit: Payer: Self-pay

## 2021-01-20 ENCOUNTER — Ambulatory Visit (INDEPENDENT_AMBULATORY_CARE_PROVIDER_SITE_OTHER): Payer: Self-pay | Admitting: Infectious Diseases

## 2021-01-20 ENCOUNTER — Encounter: Payer: Self-pay | Admitting: Infectious Diseases

## 2021-01-20 VITALS — BP 126/83 | HR 73 | Temp 98.0°F | Wt 231.0 lb

## 2021-01-20 DIAGNOSIS — R202 Paresthesia of skin: Secondary | ICD-10-CM

## 2021-01-20 DIAGNOSIS — G4700 Insomnia, unspecified: Secondary | ICD-10-CM

## 2021-01-20 DIAGNOSIS — B1920 Unspecified viral hepatitis C without hepatic coma: Secondary | ICD-10-CM

## 2021-01-20 DIAGNOSIS — R197 Diarrhea, unspecified: Secondary | ICD-10-CM

## 2021-01-20 DIAGNOSIS — R200 Anesthesia of skin: Secondary | ICD-10-CM

## 2021-01-20 DIAGNOSIS — Z23 Encounter for immunization: Secondary | ICD-10-CM

## 2021-01-20 MED ORDER — QUETIAPINE FUMARATE 25 MG PO TABS: 25.0000 mg | ORAL_TABLET | Freq: Every day | ORAL | 0 refills | Status: DC

## 2021-01-20 MED ORDER — GABAPENTIN 100 MG PO CAPS
100.0000 mg | ORAL_CAPSULE | Freq: Every day | ORAL | 0 refills | Status: DC
Start: 2021-01-20 — End: 2021-02-07

## 2021-01-20 MED ORDER — OMEPRAZOLE 20 MG PO CPDR
20.0000 mg | DELAYED_RELEASE_CAPSULE | Freq: Every day | ORAL | 0 refills | Status: DC
Start: 2021-01-20 — End: 2021-02-07

## 2021-01-20 MED FILL — QUETIAPINE FUMARATE 25 MG T: 25 | 30 days supply | Qty: 30 | Fill #1

## 2021-01-20 MED FILL — GABAPENTIN 100 MG CAPSULE: 100 | 30 days supply | Qty: 30 | Fill #1

## 2021-01-20 NOTE — Telephone Encounter (Signed)
Please call and let him know script refills were sent, and remind him of his appt with Dr. Delford Field.  Thanks

## 2021-01-20 NOTE — Progress Notes (Addendum)
[facility] for Infectious Diseases                                                             [address], [place], [place], [address]                                                                  Phn. [phone]; Fax: [phone]                                                                             Date: 01/20/21  Reason for Follow up: lab results and need for immunization  Assessment Terrence Henry is a 40 y.o.old male with PMHof polysubstance abuse , hepatitis A and hospital admission 11/21-12/9 for a cardiac arrest 2/2 drug overdose who is here for   Hepatitis C HCV ab reactive in 03/11/2020 HCV RNA positive in 04/2020 but negative in 12/23/20 - spontaneous clearance  Immunization Immune to Hepatitis A 2nd dose of Hep B today Tdap today   Counseling Risks of re-infection discussed    Smoking  Counseled  Plan Hep B vaccine 2nd dose today  Tdap today  Fu in 6 months for rechecking HCV RNA per patients's request   All questions and concerns were discussed and addressed. Patient verbalized understanding of the plan. ____________________________________________________________________________________________________________________ Subjective Terrence Henry is a 40 year old male who is here for follow-up of hepatitis C lab results.  His HCVRNA was negative on January 09, 2021 which means he has spontaneously cleared his hep C.  Discussed with him he does not need treatment for hepatitis C now.  I also discussed with him regarding reinfection of hepatitis C with continued risk factors for acquiring hepatitis C including IVDU and hep C positive sexual partners.  He smokes 1 pack of cigarettes in 2 days, denies using alcohol and stays has not used any IV drugs since November 2021.  He also denies being currently sexually active.     ROS: Constitutional: Negative for fever, chills, activity change,  appetite change, fatigue and unexpected weight change.  HENT: Negative for congestion, sore throat, rhinorrhea, sneezing, trouble swallowing and sinus pressure.  Eyes: Negative for photophobia and visual disturbance.  Respiratory: Negative for cough, chest tightness, shortness of breath, wheezing and stridor.  Cardiovascular: Negative for chest pain, palpitations and leg swelling.  Gastrointestinal: Negative for nausea, vomiting, abdominal pain, diarrhea, constipation, blood in stool, abdominal distention and anal bleeding.  Genitourinary: Negative for dysuria, hematuria, flank pain and difficulty urinating.  Musculoskeletal: Negative for myalgias, back pain, joint swelling, arthralgias and gait problem.  Skin: Negative for color change, pallor, rash and wound.  Neurological: Negative for dizziness, tremors, weakness and light-headedness.  Hematological: Negative for adenopathy. Does not bruise/bleed easily.  Psychiatric/Behavioral: Negative for behavioral problems, confusion,  sleep disturbance, dysphoric mood, decreased concentration and agitation.   Past Medical History:  Diagnosis Date  . Acute hepatitis A 03/2020  . Acute respiratory failure with hypoxia and hypercapnia (HCC)   . Anxiety   . Cardiac arrest (HCC) 10/31/2020  . Carpal tunnel syndrome    bilateral  . Drug addiction (HCC)   . Elevated LFTs   . Hepatitis C virus infection   . Inguinal hernia    left  . Overdose   . Panic attacks   . Rhabdomyolysis 06/30/2019  . Tobacco abuse   . Type 2 MI (myocardial infarction) (HCC)   . Venereal wart    Past Surgical History:  Procedure Laterality Date  . MANDIBLE FRACTURE SURGERY     Altercation   Current Outpatient Medications on File Prior to Visit  Medication Sig Dispense Refill  . gabapentin (NEURONTIN) 100 MG capsule Take 1 capsule (100 mg total) by mouth at bedtime. 60 capsule 0  . ketoconazole (NIZORAL) 2 % cream Apply 1 application topically daily. 15 g 0  .  omeprazole (PRILOSEC) 20 MG capsule Take 1 capsule (20 mg total) by mouth daily. 14 capsule 0  . QUEtiapine (SEROQUEL) 25 MG tablet Take 1 tablet (25 mg total) by mouth at bedtime. 60 tablet 0   No current facility-administered medications on file prior to visit.   Social History   Socioeconomic History  . Marital status: Single    Spouse name: Not on file  . Number of children: Not on file  . Years of education: Not on file  . Highest education level: Not on file  Occupational History  . Occupation: Education administrator    Comment: self  Tobacco Use  . Smoking status: Current Some Day Smoker    Packs/day: 1.00    Years: 15.00    Pack years: 15.00    Types: Cigarettes  . Smokeless tobacco: Never Used  Substance and Sexual Activity  . Alcohol use: Yes    Alcohol/week: 0.0 standard drinks  . Drug use: Yes    Types: Methamphetamines, MDMA (Ecstacy), Cocaine, IV    Comment: Heroin  . Sexual activity: Yes    Birth control/protection: Condom  Other Topics Concern  . 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, he has a girlfriend lives with her   Smoker sometimes alcohol, uses heroin and methamphetamine (ice)   Social Determinants of Health   Financial Resource Strain: Not on file  Food Insecurity: Not on file  Transportation Needs: Not on file  Physical Activity: Not on file  Stress: Not on file  Social Connections: Not on file  Intimate Partner Violence: Not on file    Vitals BP 126/83   Pulse 73   Temp 98 F (36.7 C) (Oral)   Wt 231 lb (104.8 kg)   BMI 28.87 kg/m    Examination  General - not in acute distress, comfortably sitting in chair HEENT - PEERLA, no pallor and no icterus, no thrush  Chest - b/l clear air entry, no additional sounds CVS- Normal s1s2, RRR Abdomen - Soft, Non tender , non distended Ext- no pedal edema Neuro: grossly normal Back - WNL Psych : calm and cooperative   Recent labs CBC Latest Ref Rng & Units  12/16/2020 11/11/2020 11/10/2020  WBC 3.4 - 10.8 x10E3/uL 12.6(H) 9.6 10.3  Hemoglobin 13.0 - 17.7 g/dL 63.3 12.6(L) 12.0(L)  Hematocrit 37.5 - 51.0 % 44.0 38.5(L) 38.7(L)  Platelets 150 - 450  x10E3/uL 212 224 169   CMP Latest Ref Rng & Units 12/23/2020 12/16/2020 11/14/2020  Glucose 65 - 99 mg/dL - 89 502(D)  BUN 6 - 20 mg/dL - 7 12  Creatinine 7.41 - 1.27 mg/dL - 2.87(O) 6.76(H)  Sodium 134 - 144 mmol/L - 140 139  Potassium 3.5 - 5.2 mmol/L - 4.5 4.1  Chloride 96 - 106 mmol/L - 102 106  CO2 22 - 32 mmol/L - - 21(L)  Calcium 8.7 - 10.2 mg/dL - 9.5 9.0  Total Protein 6.0 - 8.5 g/dL - 6.8 -  Total Bilirubin 0.0 - 1.2 mg/dL - 0.4 -  Alkaline Phos 44 - 121 IU/L - 65 -  AST 0 - 40 IU/L - 16 -  ALT 9 - 46 U/L 23 - -     Pertinent Microbiology Results for orders placed or performed in visit on 12/16/20  Urine Culture     Status: None   Collection Time: 12/16/20 11:48 AM   Specimen: Urine   UR  Result Value Ref Range Status   Urine Culture, Routine Final report  Final   Organism ID, Bacteria No growth  Final    Pertinent Imaging All pertinent labs/Imagings/notes reviewed. All pertinent plain films and CT images have been personally visualized and interpreted; radiology reports have been reviewed. Decision making incorporated into the Impression / Recommendations.  I spent 30 minutes with the patient including  review of prior medical records with greater than 50% of time in face to face counsel of the patient.    Electronically signed by:  Odette Fraction, MD Infectious Disease Physician [facility] for Infectious Disease [address] [place], [place] [address] Phone: [phone]  Fax: [phone]

## 2021-01-20 NOTE — Telephone Encounter (Signed)
Medication refill

## 2021-01-24 NOTE — Telephone Encounter (Signed)
Contacted patient by phone to notify them of medications filled and upcoming doctor's appt. UTLVM

## 2021-01-27 MED FILL — OMEPRAZOLE 20 MG CAP: 20 | 30 days supply | Qty: 30 | Fill #0

## 2021-02-01 ENCOUNTER — Telehealth: Payer: Self-pay

## 2021-02-01 NOTE — Telephone Encounter (Signed)
error 

## 2021-02-06 DIAGNOSIS — L853 Xerosis cutis: Secondary | ICD-10-CM | POA: Insufficient documentation

## 2021-02-06 DIAGNOSIS — B360 Pityriasis versicolor: Secondary | ICD-10-CM | POA: Insufficient documentation

## 2021-02-06 NOTE — Progress Notes (Signed)
Subjective:    Patient ID: Terrence Henry, male    DOB: December 06, 1981, 40 y.o.   MRN: [id number]  40 y.o.M Hx Cardiac arrest  Hep C,   02/07/21  Here for PCP to est  Patient was in the hospital in November with heroin overdose and cardiac arrest.  He then subsequently saw [facility] January 6 for post hospital follow-up in the mobile unit.  Patient is here today now to officially establish for primary care.  Below is the note for Ms. Mayers in January Saw C Mayers 12/16/20 [place] Clarkis a 40 y.o.malewith PMH significant for polysubstance abuse On11/21/2021, patient was brought to the ED as a cardiac arrest. He was found unconscious by family in the bathroom with needles around. Unknown downtime. Family given Narcan without success before EMS arrived. EMS achieved ROSC after 20 minutes of CPR, epinephrine x5 and defibrillation. Brought in to the ED with a King airway, in normal sinus rhythm and blood pressure low in 60s.  In the ED, he was intubated for a low GCS of 3, started on IV fluids and Levophed. Per cardiology, primary cause of cardiac arrest was not cardiac in origin and rather likely to be an overdose, hence patient did not have an emergent cardiac cath.  Echocardiogram showed EF of 40 to 45% with global hypokinesis and grade 1 diastolic dysfunction. Admitted to ICU, on mechanical ventilation, therapeutic hypothermia protocol. Extubated on 11/27. Transferred to hospitalist service on 12/2. Patient has significantly improved in physical and mental status in last several days. He qualified for CIR and was waiting for bed but at this time, he has progressed significantly and is strong enough to go home.  Hospital Course   Cardiac arrest  Cardiogenic shock postarrest Acute hypoxic hypercapnic respiratory failure -Brought in for cardiac arrest likely due to heroin overdose. Successfully resuscitated by EMS prior to arrival.  -Initially admitted to ICU, on  mechanical ventilation, therapeutic hypothermia protocol. -Required IV fluid and pressors. Hemodynamics improved. -Currently normal sinus rhythm, stable blood pressure and breathing in room air.  Hypoxic ischemic encephalopathy Intermittent delirium  -Secondary to unknown downtime during cardiac arrest -Mental status has gradually improved. Much better today. Off restraints for last 24 hours. Currently on Seroquel 100 mg nightly.Discharged with the same. Continue to taper as an outpatient.   Diarrhea Non-anion gap metabolic acidosis -GI pathogen panel negative. Suspected to be related to tube feeding. Diarrhea stopped after tube feeding was stopped.   Acute kidney injury -Creatinine peaked at 2.53 secondary to shock. Creatinine improved back to normal. Continue to monitor  Physical deconditioning -He qualified for CIR and was waiting for bed but at this time, he has progressed significantly and is strong enough to go home.  Polysubstance abuse -Counseled to quit  Reports today that since he has been home he has been experiencing some hair loss at the back of his head, diarrhea, numbness in his fingertips, and difficulty sleeping.   Reports that he has had some hair loss at the back of his head, states that he feels the rash back there looks like it is getting bigger, states that he does notice it feeling somewhat tender when he is sleeping.  Has not tried anything for relief.  Denies any new fragrances, lotions, body washes, medications.  Reports that he has been having episodes of diarrhea on a daily basis since he has been home from the hospital, states that he will have some abdominal cramping prior to the episode, resolved with  the episode, denies any blood or mucus, any nocturnal events.  Denies any normal soft formed bowel movements.  Reports that he is drinking 3-4 bottles of water a day, is unsure of his fiber intake.  Reports that he has been experiencing  numbness in all of his fingertips, states that it will feel burning at times.  Reports that this is new since being home from the hospital.  Reports that he noticed it while he was at the end of his hospitalization.  Denies tingling or radiation.  Has not tried anything for relief.  Reports that he has been having difficulty sleeping, states that he was prescribed Seroquel while he was in the hospital, states that he has been out of it for the last 4 days, states that he was sleeping well  Reports that he has not used any substances since he has been home, states that he has plans to go to inpatient rehab after his upcoming court dates.  Has not started any other type of counseling  Reports that he has been having discomfort when his urination begins, states it feels like pressure, states that he feels his flow is altered, denies burning, or discharge.  Denies urgency or increase in frequency  1. Hyperglycemia No nursing was available today on the mobile unit, [facility].  Patient did have episodes of hyperglycemia while hospitalized, no previous diagnoses of diabetes. - Hemoglobin A1c  2. Tinea capitis Patient education given, trial ketoconazole, encourage patient to use Selsun Blue shampoo - ketoconazole (NIZORAL) 2 % cream; Apply 1 application topically daily.  Dispense: 15 g; Refill: 0  3. Numbness and tingling in both hands Trial gabapentin 100 mg at bedtime - gabapentin (NEURONTIN) 100 MG capsule; Take 1 capsule (100 mg total) by mouth at bedtime.  Dispense: 60 capsule; Refill: 0 - TSH  4. Dysuria Encourage patient to increase water intake - PSA - RPR - Urine Culture - Urine cytology ancillary only  5. Hepatitis C virus infection without hepatic coma, unspecified chronicity Will refer patient to infectious disease - HIV antibody (with reflex)  6. History of substance abuse [facility]) Patient declined counseling  referral, has plans to go to inpatient rehab - CBC with Differential/Platelet - Comp. Metabolic Panel (12)  7. Insomnia, unspecified type Resume Seroquel, trial 25 mg - QUEtiapine (SEROQUEL) 25 MG tablet; Take 1 tablet (25 mg total) by mouth at bedtime.  Dispense: 60 tablet; Refill: 0  8. Diarrhea, unspecified type Patient education given, trial Prilosec for 2 weeks, increase fiber intake GI pathogen panel completed in hospital was negative - omeprazole (PRILOSEC) 20 MG capsule; Take 1 capsule (20 mg total) by mouth daily.  Dispense: 14 capsule; Refill: 0   OUD?? Rehab??  Pt just saw [facility] 01/2021: Alif Petrak Clarkis a 40 y.o.oldmalewith PMHof polysubstance abuse , hepatitis Aand hospital admission 11/21-12/9 for a cardiac arrest 2/2 drug overdose who is here for   Hepatitis C HCV ab reactive in 03/11/2020 HCV RNA positive in 04/2020 but negative in 12/23/20 - spontaneous clearance  Immunization Immune to Hepatitis A 2nd dose of Hep B today Tdap today   Counseling Risks of re-infection discussed    Smoking  Counseled  Plan Hep B vaccine 2nd dose today  Tdap today  Fu in 6 months for rechecking HCV RNA per patients's request   Patient states the rash on his head has resolved and he is off of his oral.  Patient states  he has seen infectious disease and they have given him vaccines for hepatitis B and HCV RNA was negative so no treatment there is indicated he is HIV negative  Patient is smoking currently but is about to enter rehab for opioids.  He began using opioids because the girlfriend he had was using heroin and started using with her on a social basis.  He has no pre-existing known mental health conditions diagnosed.  He is smoking 1/2 pack a day of cigarettes.  He did have diarrhea after he just got out of the hospital now this is resolved.  He still has insomnia he was given Seroquel 25 mg daily and when he tried to 50 mg daily dosing it seemed to help would like  refills.  He is about to go into inpatient rehab for his heroin use in a week. Patient has difficulty starting his stream.  Urinalysis at the mobile unit showed blood in the urine.  He denies seeing blood in the urine at this time.     Past Medical History:  Diagnosis Date  . Acute hepatitis A 03/2020  . Acute respiratory failure with hypoxia and hypercapnia (HCC)   . Anxiety   . Cardiac arrest (HCC) 10/31/2020  . Carpal tunnel syndrome    bilateral  . Drug addiction (HCC)   . Elevated LFTs   . Hepatitis C virus infection   . Inguinal hernia    left  . Numbness and tingling in both hands 12/17/2020  . Overdose   . Panic attacks   . Rhabdomyolysis 06/30/2019  . Tobacco abuse   . Type 2 MI (myocardial infarction) (HCC)   . Venereal wart      Family History  Problem Relation Age of Onset  . Hypertension Mother   . Hyperlipidemia Mother   . Diabetes Father      Social History   Socioeconomic History  . Marital status: Single    Spouse name: Not on file  . Number of children: Not on file  . Years of education: Not on file  . Highest education level: Not on file  Occupational History  . Occupation: Education administratorpainter    Comment: self  Tobacco Use  . Smoking status: Current Some Day Smoker    Packs/day: 1.00    Years: 15.00    Pack years: 15.00    Types: Cigarettes  . Smokeless tobacco: Never Used  Substance and Sexual Activity  . Alcohol use: Yes    Alcohol/week: 0.0 standard drinks  . Drug use: Yes    Types: Methamphetamines, MDMA (Ecstacy), Cocaine, IV    Comment: Heroin  . Sexual activity: Yes    Birth control/protection: Condom  Other Topics Concern  . 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 QUEtiapine (SEROQUEL) 25 MG tablet Take 1 tablet (25 mg total) by mouth at bedtime. 30 tablet 0  . gabapentin (NEURONTIN) 100 MG capsule Take 1 capsule (100 mg  total) by mouth at bedtime. (Patient not taking: Reported on 02/07/2021) 60 capsule 0  . ketoconazole (NIZORAL) 2 % cream Apply 1 application topically daily. (Patient not taking: Reported on 02/07/2021) 15 g 0  . omeprazole (PRILOSEC) 20 MG capsule Take 1 capsule (20 mg total) by mouth daily. (Patient not taking: Reported on 02/07/2021) 30 capsule 0   No facility-administered medications prior to visit.     Review of Systems  HENT: Negative.   Respiratory: Negative.   Cardiovascular: Negative.   Gastrointestinal: Negative.   Genitourinary: Positive for difficulty urinating and dysuria.       Objective:   Physical Exam Vitals:   02/07/21 1336  BP: 127/80  Pulse: 79  Resp: 16  SpO2: 97%  Weight: 242 lb (109.8 kg)  Height: 6\' 3"  (1.905 m)    Gen: Pleasant, well-nourished, in no distress,  normal affect  ENT: No lesions,  mouth clear,  oropharynx clear, no postnasal drip  Neck: No JVD, no TMG, no carotid bruits  Lungs: No use of accessory muscles, no dullness to percussion, clear without rales or rhonchi  Cardiovascular: RRR, heart sounds normal, no murmur or gallops, no peripheral edema  Abdomen: soft and NT, no HSM,  BS normal  Musculoskeletal: No deformities, no cyanosis or clubbing  Neuro: alert, non focal  Skin: Warm, no lesions or rashes  Rectal exam shows benign prostatic hypertrophy   EKG normal sinus rhythm no prolonged QT interval QTC was 406 ms        Assessment & Plan:  I personally reviewed all images and lab data in the [facility] system as well as any outside  material available during this office visit and agree with the  radiology impressions.   Cardiac arrest (HCC) Due to opioid overdose now resolved  Hepatitis C virus infection Hepatitis C viral infection in the past current RNA levels are negative suggesting no viral load  Tinea capitis This has resolved  Benign prostatic hyperplasia with urinary hesitancy Recheck urinalysis and begin trial tamsulosin 0.4 mg daily  Opioid use disorder Checked EKG today this was normal QTc interval normal therefore will use Seroquel 50 mg at bedtime to help sleep with insomnia  Also check TB test for screening for the inpatient rehab  Patient is cleared medically for inpatient rehab  TOBACCO ABUSE History of tobacco use patient currently is about to enter rehab will hold off intervention on this till after he is out of rehab  Numbness and tingling in both hands Had formally been on gabapentin for this symptoms have resolved will discontinue gabapentin  Irregular cardiac rhythm This has resolved EKG today normal sinus rhythm no prolongation in QT interval   Diagnoses and all orders for this visit:  Benign prostatic hyperplasia with urinary hesitancy  Insomnia, unspecified type -     Discontinue: QUEtiapine (SEROQUEL) 50 MG tablet; Take 1 tablet (50 mg total) by mouth at bedtime. -     QUEtiapine (SEROQUEL) 50 MG tablet; Take 1 tablet (50 mg total) by mouth at bedtime.  Dysuria -     Urinalysis  Screening-pulmonary TB -     QuantiFERON-TB Gold Plus  Encounter for monitoring cardiotoxic drug therapy -     EKG 12-Lead  Cardiac arrest (HCC)  Hepatitis C virus infection without hepatic coma, unspecified chronicity  Tinea capitis  Opioid use disorder  TOBACCO ABUSE  Numbness and tingling in both hands  Irregular cardiac rhythm  Other orders -     Discontinue: tamsulosin (FLOMAX) 0.4 MG  CAPS capsule; Take 1 capsule (0.4 mg total) by mouth daily. -     tamsulosin (FLOMAX) 0.4 MG  CAPS capsule; Take 1 capsule (0.4 mg total) by mouth daily. -     diclofenac Sodium (VOLTAREN) 1 % GEL; Apply 2 g topically 4 (four) times daily. To right shoulder    Note patient is fully vaccinated COVID

## 2021-02-07 ENCOUNTER — Other Ambulatory Visit: Payer: Self-pay

## 2021-02-07 ENCOUNTER — Encounter: Payer: Self-pay | Admitting: Critical Care Medicine

## 2021-02-07 ENCOUNTER — Ambulatory Visit: Payer: Self-pay | Attending: Critical Care Medicine | Admitting: Critical Care Medicine

## 2021-02-07 VITALS — BP 127/80 | HR 79 | Resp 16 | Ht 75.0 in | Wt 242.0 lb

## 2021-02-07 DIAGNOSIS — I499 Cardiac arrhythmia, unspecified: Secondary | ICD-10-CM

## 2021-02-07 DIAGNOSIS — B350 Tinea barbae and tinea capitis: Secondary | ICD-10-CM | POA: Insufficient documentation

## 2021-02-07 DIAGNOSIS — R3911 Hesitancy of micturition: Secondary | ICD-10-CM | POA: Insufficient documentation

## 2021-02-07 DIAGNOSIS — I469 Cardiac arrest, cause unspecified: Secondary | ICD-10-CM

## 2021-02-07 DIAGNOSIS — G4700 Insomnia, unspecified: Secondary | ICD-10-CM | POA: Insufficient documentation

## 2021-02-07 DIAGNOSIS — Z23 Encounter for immunization: Secondary | ICD-10-CM | POA: Insufficient documentation

## 2021-02-07 DIAGNOSIS — R200 Anesthesia of skin: Secondary | ICD-10-CM | POA: Insufficient documentation

## 2021-02-07 DIAGNOSIS — Z8674 Personal history of sudden cardiac arrest: Secondary | ICD-10-CM | POA: Insufficient documentation

## 2021-02-07 DIAGNOSIS — N401 Enlarged prostate with lower urinary tract symptoms: Secondary | ICD-10-CM | POA: Insufficient documentation

## 2021-02-07 DIAGNOSIS — R300 Dysuria: Secondary | ICD-10-CM | POA: Insufficient documentation

## 2021-02-07 DIAGNOSIS — B1920 Unspecified viral hepatitis C without hepatic coma: Secondary | ICD-10-CM | POA: Insufficient documentation

## 2021-02-07 DIAGNOSIS — Z111 Encounter for screening for respiratory tuberculosis: Secondary | ICD-10-CM | POA: Insufficient documentation

## 2021-02-07 DIAGNOSIS — F17200 Nicotine dependence, unspecified, uncomplicated: Secondary | ICD-10-CM

## 2021-02-07 DIAGNOSIS — R202 Paresthesia of skin: Secondary | ICD-10-CM | POA: Insufficient documentation

## 2021-02-07 DIAGNOSIS — Z79899 Other long term (current) drug therapy: Secondary | ICD-10-CM | POA: Insufficient documentation

## 2021-02-07 DIAGNOSIS — F17210 Nicotine dependence, cigarettes, uncomplicated: Secondary | ICD-10-CM | POA: Insufficient documentation

## 2021-02-07 DIAGNOSIS — Z5181 Encounter for therapeutic drug level monitoring: Secondary | ICD-10-CM | POA: Insufficient documentation

## 2021-02-07 DIAGNOSIS — Z8619 Personal history of other infectious and parasitic diseases: Secondary | ICD-10-CM | POA: Insufficient documentation

## 2021-02-07 DIAGNOSIS — F1190 Opioid use, unspecified, uncomplicated: Secondary | ICD-10-CM

## 2021-02-07 DIAGNOSIS — F1110 Opioid abuse, uncomplicated: Secondary | ICD-10-CM | POA: Insufficient documentation

## 2021-02-07 MED ORDER — TAMSULOSIN HCL 0.4 MG PO CAPS: 0.4000 mg | ORAL_CAPSULE | Freq: Every day | ORAL | 3 refills | Status: DC

## 2021-02-07 MED ORDER — QUETIAPINE FUMARATE 50 MG PO TABS: 50.0000 mg | ORAL_TABLET | Freq: Every day | ORAL | 1 refills | Status: DC

## 2021-02-07 MED ORDER — DICLOFENAC SODIUM 1 % EX GEL: 2.0000 g | Freq: Four times a day (QID) | CUTANEOUS | 0 refills | Status: DC

## 2021-02-07 MED ORDER — TAMSULOSIN HCL 0.4 MG PO CAPS
0.4000 mg | ORAL_CAPSULE | Freq: Every day | ORAL | 3 refills | Status: DC
Start: 2021-02-07 — End: 2021-02-07

## 2021-02-07 MED FILL — QUETIAPINE FUMARATE 50 MG T: 50 | 30 days supply | Qty: 60 | Fill #0

## 2021-02-07 MED FILL — DICLOFENAC SODIUM 1% GEL: 1 | 12 days supply | Qty: 100 | Fill #0

## 2021-02-07 MED FILL — TAMSULOSIN HCL 0.4 MG CAP: 0.4 | 30 days supply | Qty: 60 | Fill #0

## 2021-02-07 NOTE — Assessment & Plan Note (Signed)
History of tobacco use patient currently is about to enter rehab will hold off intervention on this till after he is out of rehab

## 2021-02-07 NOTE — Patient Instructions (Signed)
TB test was ordered in the lab  EKG was performed  Seroquel increased to 50 mg at bedtime 82-month supply given sent to your [facility] [facility] store  Follow-up with Dr. Delford Field in 3 months after discharge from your rehab  Begin tamsulosin 1 daily for improvement of your prostate flow and obtain urine sample before you leave today  Continue to push plenty of fluids

## 2021-02-07 NOTE — Assessment & Plan Note (Signed)
This has resolved EKG today normal sinus rhythm no prolongation in QT interval

## 2021-02-07 NOTE — Assessment & Plan Note (Signed)
Had formally been on gabapentin for this symptoms have resolved will discontinue gabapentin

## 2021-02-07 NOTE — Assessment & Plan Note (Signed)
This has resolved.

## 2021-02-07 NOTE — Assessment & Plan Note (Signed)
Hepatitis C viral infection in the past current RNA levels are negative suggesting no viral load

## 2021-02-07 NOTE — Assessment & Plan Note (Signed)
Due to opioid overdose now resolved

## 2021-02-07 NOTE — Assessment & Plan Note (Signed)
Recheck urinalysis and begin trial tamsulosin 0.4 mg daily

## 2021-02-07 NOTE — Progress Notes (Signed)
Has concerns about urethra and urine stream Noticed that he is unable to void in a straight line Pressure is weird and has pain  Left shoulder  Pain X 3 weeks Not tried OTC medications

## 2021-02-07 NOTE — Assessment & Plan Note (Signed)
Checked EKG today this was normal QTc interval normal therefore will use Seroquel 50 mg at bedtime to help sleep with insomnia  Also check TB test for screening for the inpatient rehab  Patient is cleared medically for inpatient rehab

## 2021-02-09 ENCOUNTER — Encounter: Payer: Self-pay | Admitting: *Deleted

## 2021-02-11 LAB — QUANTIFERON-TB GOLD PLUS
QuantiFERON Mitogen Value: >10 [IU]/mL
QuantiFERON Nil Value: 0.05 [IU]/mL
QuantiFERON TB1 Ag Value: 0.04 [IU]/mL
QuantiFERON TB2 Ag Value: 0.03 [IU]/mL
QuantiFERON-TB Gold Plus: NEGATIVE

## 2021-02-11 LAB — URINALYSIS
Bilirubin, UA: NEGATIVE
Glucose, UA: NEGATIVE
Ketones, UA: NEGATIVE
Leukocytes,UA: NEGATIVE
Nitrite, UA: NEGATIVE
Protein,UA: NEGATIVE
RBC, UA: NEGATIVE
Specific Gravity, UA: 1.02 (ref 1.005–1.030)
Urobilinogen, Ur: 0.2 mg/dL (ref 0.2–1.0)
pH, UA: 5.5 (ref 5.0–7.5)

## 2021-03-04 ENCOUNTER — Telehealth: Payer: Self-pay | Admitting: General Practice

## 2021-03-04 NOTE — Telephone Encounter (Signed)
Pt mother- Roshard Rezabek is calling to notify the office that pt passed away. Please advise

## 2021-03-04 NOTE — Telephone Encounter (Signed)
Called patient and LVM advising that his appointment with Dr. Delford Field scheduled for 05/09/21 had to be rescheduled because 5/30 is Memorial day and the office will be closed. Advised patient we rescheduled his appointment for 05/10/21 at 1:30 but if he needs to reschedule give [place] a call back at [phone].

## 2021-03-08 NOTE — Telephone Encounter (Signed)
Left a VM on the mothers cell expressing my condolences over her sons untimely death.

## 2021-03-11 DEATH — deceased

## 2021-05-09 ENCOUNTER — Ambulatory Visit: Payer: Self-pay | Admitting: Critical Care Medicine

## 2021-05-10 ENCOUNTER — Ambulatory Visit: Payer: Self-pay | Admitting: Critical Care Medicine

## 2021-07-25 ENCOUNTER — Ambulatory Visit: Payer: Self-pay | Admitting: Infectious Diseases

## 2021-09-30 IMAGING — DX DG CHEST 1V PORT
1 series · 1 of 1 positions shown · non-contrast
Comparison: November 06, 2020

CLINICAL DATA: Hypoxia

EXAM:
PORTABLE CHEST 1 VIEW

[chest]
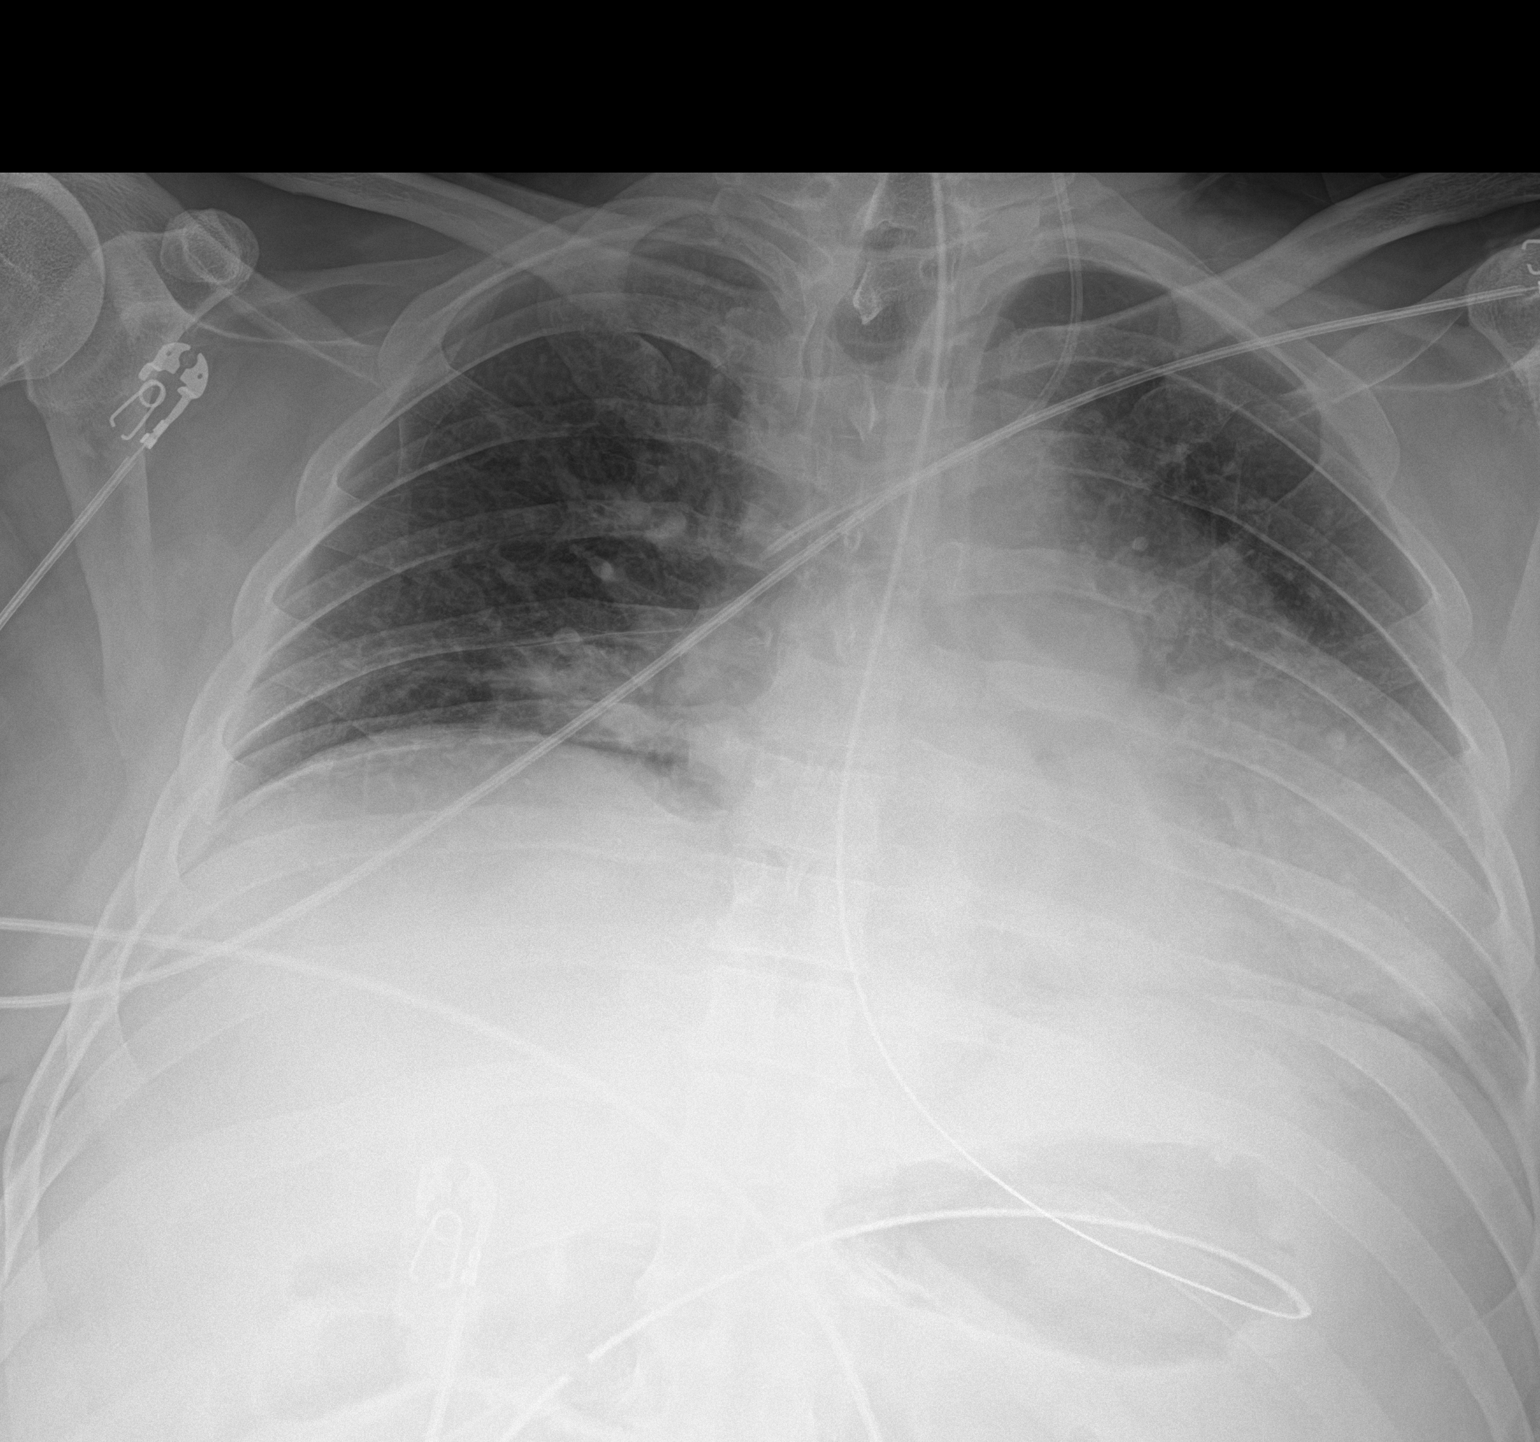

[1 of 1 positions shown; findings below may reference images not displayed]

FINDINGS: Endotracheal tube no longer evident. Central catheter tip is in the
superior vena cava. Nasogastric tube tip is in the second portion of
the duodenum. No pneumothorax. There is a small left pleural
effusion with patchy airspace opacity in the medial left base. There
is atelectatic change in the right base. Heart is mildly enlarged
with pulmonary vascularity normal. No adenopathy. No bone lesions.
IMPRESSION: Tube and catheter positions as described without pneumothorax. Small
left pleural effusion with suspected pneumonia left lower lobe.
Atelectasis right base. Stable cardiac prominence.

## 2021-10-04 IMAGING — DX DG ABD PORTABLE 1V
1 series · 1 of 1 positions shown · non-contrast
Comparison: Abdominal x-ray dated November 10, 2020.

CLINICAL DATA: Feeding tube placement.

EXAM:
PORTABLE ABDOMEN - 1 VIEW

[abdomen supine]
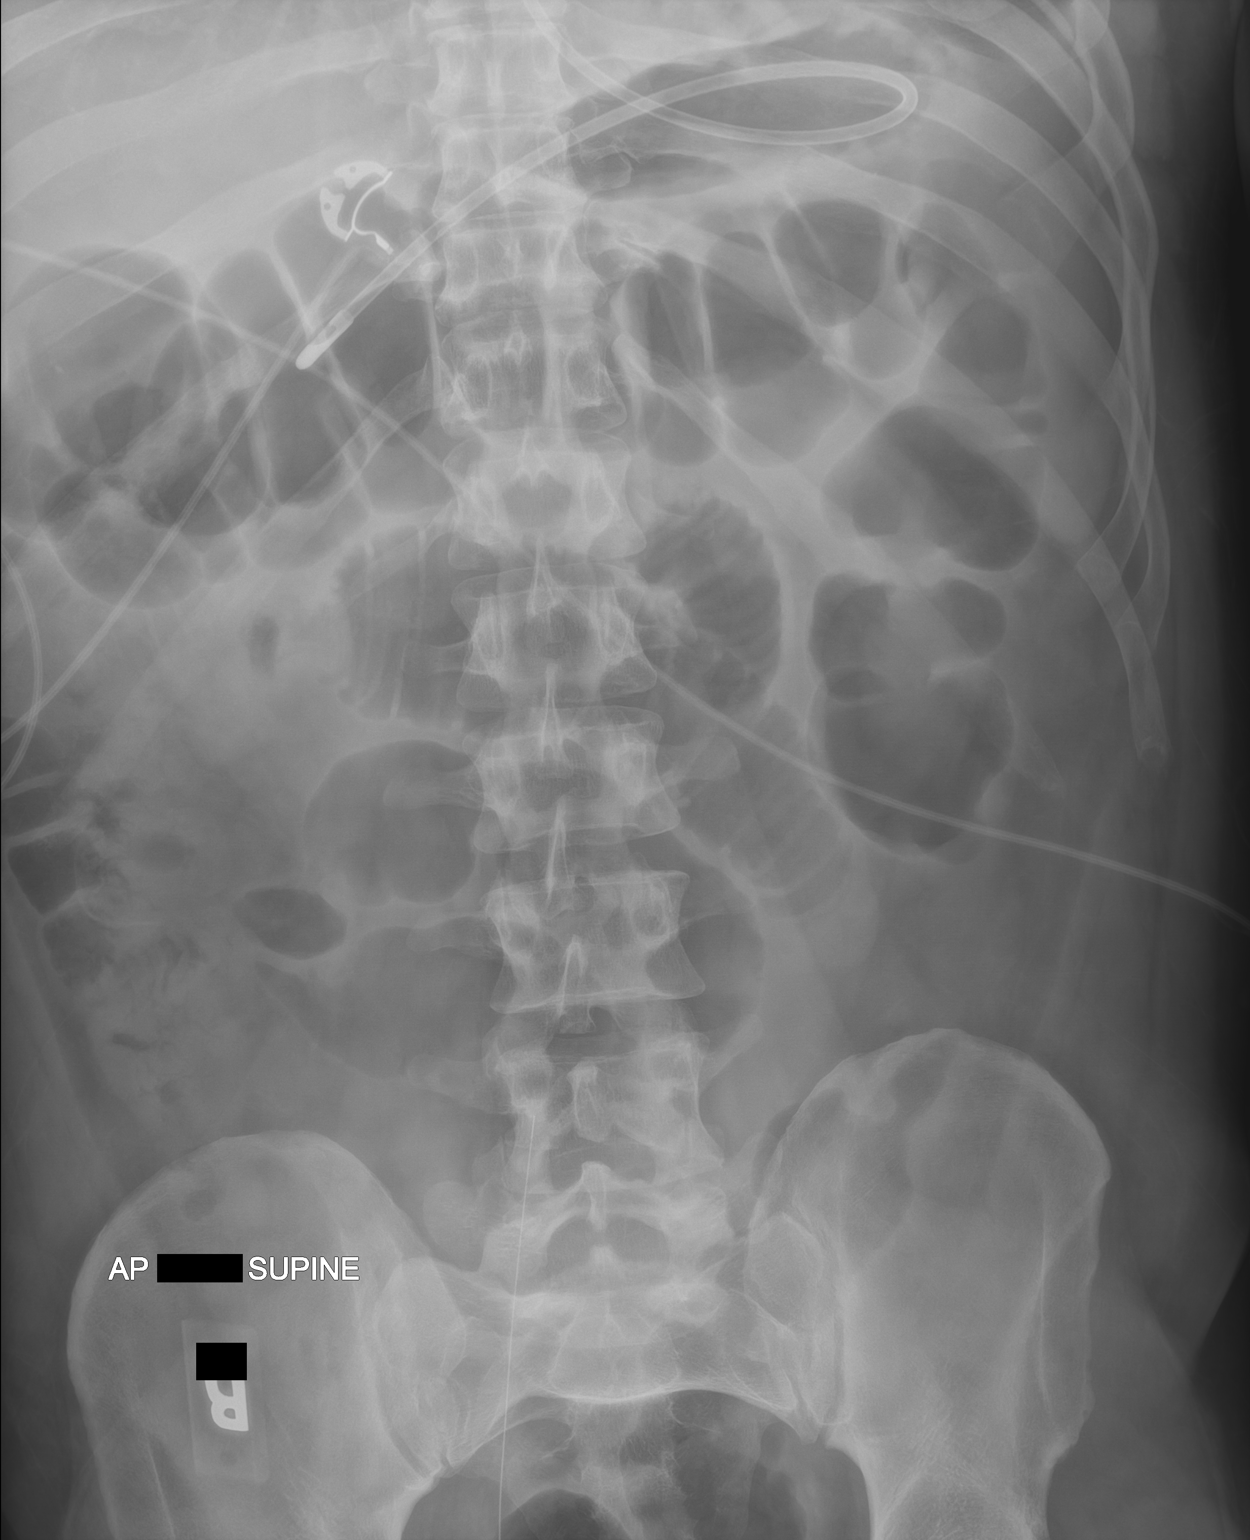

[1 of 1 positions shown; findings below may reference images not displayed]

FINDINGS: Unchanged feeding tube tip near the pylorus. Unchanged mildly
dilated small bowel loops in the central abdomen. No acute osseous
abnormality.
IMPRESSION: 1. Unchanged feeding tube tip near the pylorus.
